# Patient Record
Sex: Female | Born: 1935 | Race: White | Hispanic: No | Smoking: Former smoker
Health system: Southern US, Community
[De-identification: ages and names within clinical notes are randomized; demographics above are authoritative.]

## PROBLEM LIST (undated history)

## (undated) DIAGNOSIS — I4821 Permanent atrial fibrillation: Secondary | ICD-10-CM

## (undated) DIAGNOSIS — I2510 Atherosclerotic heart disease of native coronary artery without angina pectoris: Secondary | ICD-10-CM

## (undated) DIAGNOSIS — E785 Hyperlipidemia, unspecified: Secondary | ICD-10-CM

## (undated) DIAGNOSIS — M1990 Unspecified osteoarthritis, unspecified site: Secondary | ICD-10-CM

## (undated) DIAGNOSIS — C8590 Non-Hodgkin lymphoma, unspecified, unspecified site: Secondary | ICD-10-CM

## (undated) DIAGNOSIS — A0472 Enterocolitis due to Clostridium difficile, not specified as recurrent: Secondary | ICD-10-CM

## (undated) DIAGNOSIS — I10 Essential (primary) hypertension: Secondary | ICD-10-CM

## (undated) DIAGNOSIS — Z950 Presence of cardiac pacemaker: Secondary | ICD-10-CM

## (undated) DIAGNOSIS — K5792 Diverticulitis of intestine, part unspecified, without perforation or abscess without bleeding: Secondary | ICD-10-CM

## (undated) DIAGNOSIS — I97190 Other postprocedural cardiac functional disturbances following cardiac surgery: Secondary | ICD-10-CM

## (undated) DIAGNOSIS — K219 Gastro-esophageal reflux disease without esophagitis: Secondary | ICD-10-CM

## (undated) HISTORY — DX: Presence of cardiac pacemaker: Z95.0

## (undated) HISTORY — PX: BREAST BIOPSY: SHX20

## (undated) HISTORY — PX: OTHER SURGICAL HISTORY: SHX169

## (undated) HISTORY — PX: CARDIOVERSION: SHX1299

## (undated) HISTORY — DX: Atherosclerotic heart disease of native coronary artery without angina pectoris: I25.10

## (undated) HISTORY — DX: Unspecified osteoarthritis, unspecified site: M19.90

## (undated) HISTORY — DX: Gastro-esophageal reflux disease without esophagitis: K21.9

## (undated) HISTORY — DX: Other postprocedural cardiac functional disturbances following cardiac surgery: I97.190

## (undated) HISTORY — PX: ABDOMINAL HYSTERECTOMY: SHX81

## (undated) HISTORY — DX: Hereditary hemochromatosis: E83.110

## (undated) HISTORY — PX: CHOLECYSTECTOMY: SHX55

## (undated) HISTORY — DX: Diverticulitis of intestine, part unspecified, without perforation or abscess without bleeding: K57.92

## (undated) HISTORY — PX: CARDIAC CATHETERIZATION: SHX172

## (undated) HISTORY — DX: Enterocolitis due to Clostridium difficile, not specified as recurrent: A04.72

## (undated) HISTORY — DX: Non-Hodgkin lymphoma, unspecified, unspecified site: C85.90

## (undated) HISTORY — DX: Hyperlipidemia, unspecified: E78.5

## (undated) HISTORY — DX: Essential (primary) hypertension: I10

## (undated) HISTORY — PX: INSERT / REPLACE / REMOVE PACEMAKER: SUR710

---

## 2004-07-31 ENCOUNTER — Ambulatory Visit: Payer: Self-pay | Admitting: Internal Medicine

## 2004-08-31 ENCOUNTER — Ambulatory Visit: Payer: Self-pay | Admitting: Internal Medicine

## 2004-09-30 ENCOUNTER — Ambulatory Visit: Payer: Self-pay | Admitting: Internal Medicine

## 2004-11-11 ENCOUNTER — Ambulatory Visit: Payer: Self-pay | Admitting: Internal Medicine

## 2004-11-25 ENCOUNTER — Ambulatory Visit: Payer: Self-pay | Admitting: Cardiology

## 2004-12-01 ENCOUNTER — Ambulatory Visit: Payer: Self-pay | Admitting: Internal Medicine

## 2004-12-13 ENCOUNTER — Ambulatory Visit: Payer: Self-pay | Admitting: Internal Medicine

## 2004-12-23 ENCOUNTER — Ambulatory Visit: Payer: Self-pay

## 2005-01-05 ENCOUNTER — Other Ambulatory Visit: Payer: Self-pay

## 2005-01-05 ENCOUNTER — Inpatient Hospital Stay: Payer: Self-pay | Admitting: Cardiology

## 2005-01-07 ENCOUNTER — Ambulatory Visit: Payer: Self-pay | Admitting: Internal Medicine

## 2005-01-14 ENCOUNTER — Ambulatory Visit: Payer: Self-pay | Admitting: Cardiology

## 2005-01-14 ENCOUNTER — Inpatient Hospital Stay (HOSPITAL_COMMUNITY): Admission: EM | Admit: 2005-01-14 | Discharge: 2005-01-18 | Payer: Self-pay | Admitting: Emergency Medicine

## 2005-01-27 ENCOUNTER — Ambulatory Visit: Payer: Self-pay | Admitting: Internal Medicine

## 2005-02-03 ENCOUNTER — Ambulatory Visit: Payer: Self-pay | Admitting: Internal Medicine

## 2005-02-17 ENCOUNTER — Ambulatory Visit: Payer: Self-pay | Admitting: Cardiology

## 2005-02-18 ENCOUNTER — Ambulatory Visit (HOSPITAL_COMMUNITY): Admission: RE | Admit: 2005-02-18 | Discharge: 2005-02-19 | Payer: Self-pay | Admitting: Cardiology

## 2005-02-18 ENCOUNTER — Ambulatory Visit: Payer: Self-pay | Admitting: Cardiology

## 2005-02-18 ENCOUNTER — Encounter: Payer: Self-pay | Admitting: Cardiology

## 2005-02-21 ENCOUNTER — Ambulatory Visit: Payer: Self-pay | Admitting: Cardiology

## 2005-02-22 ENCOUNTER — Ambulatory Visit: Payer: Self-pay | Admitting: Cardiology

## 2005-02-23 ENCOUNTER — Ambulatory Visit: Payer: Self-pay | Admitting: Cardiology

## 2005-02-28 ENCOUNTER — Ambulatory Visit: Payer: Self-pay | Admitting: Internal Medicine

## 2005-03-02 ENCOUNTER — Ambulatory Visit: Payer: Self-pay | Admitting: Cardiology

## 2005-03-07 ENCOUNTER — Ambulatory Visit: Payer: Self-pay | Admitting: Internal Medicine

## 2005-03-15 ENCOUNTER — Ambulatory Visit: Payer: Self-pay | Admitting: Cardiology

## 2005-03-31 ENCOUNTER — Ambulatory Visit: Payer: Self-pay | Admitting: Internal Medicine

## 2005-04-11 ENCOUNTER — Ambulatory Visit: Payer: Self-pay | Admitting: Internal Medicine

## 2005-04-26 ENCOUNTER — Ambulatory Visit: Payer: Self-pay | Admitting: Internal Medicine

## 2005-04-30 ENCOUNTER — Ambulatory Visit: Payer: Self-pay | Admitting: Internal Medicine

## 2005-05-09 ENCOUNTER — Ambulatory Visit: Payer: Self-pay | Admitting: Internal Medicine

## 2005-05-09 ENCOUNTER — Ambulatory Visit (HOSPITAL_COMMUNITY): Admission: RE | Admit: 2005-05-09 | Discharge: 2005-05-10 | Payer: Self-pay | Admitting: Internal Medicine

## 2005-05-20 ENCOUNTER — Ambulatory Visit: Payer: Self-pay | Admitting: Internal Medicine

## 2005-05-31 ENCOUNTER — Ambulatory Visit: Payer: Self-pay | Admitting: Internal Medicine

## 2005-06-07 ENCOUNTER — Ambulatory Visit: Payer: Self-pay | Admitting: Cardiology

## 2005-06-13 ENCOUNTER — Ambulatory Visit: Payer: Self-pay | Admitting: Internal Medicine

## 2005-06-13 ENCOUNTER — Ambulatory Visit (HOSPITAL_COMMUNITY): Admission: RE | Admit: 2005-06-13 | Discharge: 2005-06-14 | Payer: Self-pay | Admitting: Internal Medicine

## 2005-06-26 ENCOUNTER — Emergency Department: Payer: Self-pay | Admitting: Emergency Medicine

## 2005-06-27 ENCOUNTER — Ambulatory Visit: Payer: Self-pay | Admitting: Internal Medicine

## 2005-06-27 ENCOUNTER — Ambulatory Visit: Payer: Self-pay

## 2005-07-01 ENCOUNTER — Ambulatory Visit: Payer: Self-pay | Admitting: Internal Medicine

## 2005-07-07 ENCOUNTER — Ambulatory Visit: Payer: Self-pay | Admitting: Internal Medicine

## 2005-09-05 ENCOUNTER — Ambulatory Visit: Payer: Self-pay | Admitting: Internal Medicine

## 2005-09-30 ENCOUNTER — Ambulatory Visit: Payer: Self-pay | Admitting: Internal Medicine

## 2005-10-07 ENCOUNTER — Ambulatory Visit: Payer: Self-pay | Admitting: Internal Medicine

## 2005-10-31 ENCOUNTER — Ambulatory Visit: Payer: Self-pay | Admitting: Internal Medicine

## 2005-12-01 ENCOUNTER — Ambulatory Visit: Payer: Self-pay | Admitting: Internal Medicine

## 2006-01-02 ENCOUNTER — Ambulatory Visit: Payer: Self-pay | Admitting: Internal Medicine

## 2006-01-29 ENCOUNTER — Ambulatory Visit: Payer: Self-pay | Admitting: Internal Medicine

## 2006-03-03 ENCOUNTER — Ambulatory Visit: Payer: Self-pay | Admitting: Internal Medicine

## 2006-03-24 ENCOUNTER — Ambulatory Visit: Payer: Self-pay | Admitting: Internal Medicine

## 2006-03-31 ENCOUNTER — Ambulatory Visit: Payer: Self-pay | Admitting: Internal Medicine

## 2006-05-16 ENCOUNTER — Ambulatory Visit: Payer: Self-pay | Admitting: Internal Medicine

## 2006-05-31 ENCOUNTER — Ambulatory Visit: Payer: Self-pay | Admitting: Internal Medicine

## 2006-06-28 ENCOUNTER — Ambulatory Visit: Payer: Self-pay | Admitting: Unknown Physician Specialty

## 2006-06-30 ENCOUNTER — Ambulatory Visit: Payer: Self-pay | Admitting: Internal Medicine

## 2006-09-12 ENCOUNTER — Ambulatory Visit: Payer: Self-pay | Admitting: Internal Medicine

## 2006-09-13 ENCOUNTER — Ambulatory Visit: Payer: Self-pay | Admitting: Internal Medicine

## 2006-09-30 ENCOUNTER — Ambulatory Visit: Payer: Self-pay | Admitting: Internal Medicine

## 2006-10-31 ENCOUNTER — Ambulatory Visit: Payer: Self-pay | Admitting: Internal Medicine

## 2006-10-31 HISTORY — PX: ATRIAL ABLATION SURGERY: SHX560

## 2006-12-01 ENCOUNTER — Ambulatory Visit: Payer: Self-pay | Admitting: Internal Medicine

## 2006-12-17 ENCOUNTER — Ambulatory Visit: Payer: Self-pay | Admitting: Internal Medicine

## 2006-12-30 ENCOUNTER — Ambulatory Visit: Payer: Self-pay | Admitting: Internal Medicine

## 2007-01-30 ENCOUNTER — Ambulatory Visit: Payer: Self-pay | Admitting: Internal Medicine

## 2007-02-09 ENCOUNTER — Ambulatory Visit: Payer: Self-pay | Admitting: Cardiology

## 2007-02-09 ENCOUNTER — Inpatient Hospital Stay (HOSPITAL_COMMUNITY): Admission: AD | Admit: 2007-02-09 | Discharge: 2007-02-14 | Payer: Self-pay | Admitting: Internal Medicine

## 2007-02-18 ENCOUNTER — Emergency Department (HOSPITAL_COMMUNITY): Admission: EM | Admit: 2007-02-18 | Discharge: 2007-02-18 | Payer: Self-pay | Admitting: Emergency Medicine

## 2007-03-01 ENCOUNTER — Ambulatory Visit: Payer: Self-pay

## 2007-03-05 ENCOUNTER — Ambulatory Visit: Payer: Self-pay | Admitting: Internal Medicine

## 2007-03-19 ENCOUNTER — Ambulatory Visit: Payer: Self-pay | Admitting: Internal Medicine

## 2007-04-30 ENCOUNTER — Ambulatory Visit: Payer: Self-pay | Admitting: Internal Medicine

## 2007-05-01 ENCOUNTER — Ambulatory Visit: Payer: Self-pay | Admitting: Internal Medicine

## 2007-05-15 ENCOUNTER — Ambulatory Visit: Payer: Self-pay | Admitting: Internal Medicine

## 2007-06-01 ENCOUNTER — Ambulatory Visit: Payer: Self-pay | Admitting: Internal Medicine

## 2007-07-02 ENCOUNTER — Ambulatory Visit: Payer: Self-pay | Admitting: Internal Medicine

## 2007-07-30 ENCOUNTER — Ambulatory Visit: Payer: Self-pay | Admitting: Internal Medicine

## 2007-07-31 ENCOUNTER — Ambulatory Visit: Payer: Self-pay | Admitting: Internal Medicine

## 2007-09-01 ENCOUNTER — Ambulatory Visit: Payer: Self-pay | Admitting: Internal Medicine

## 2007-09-11 ENCOUNTER — Ambulatory Visit: Payer: Self-pay | Admitting: Internal Medicine

## 2007-09-24 ENCOUNTER — Ambulatory Visit: Payer: Self-pay | Admitting: Cardiology

## 2007-09-24 ENCOUNTER — Ambulatory Visit: Payer: Self-pay | Admitting: Internal Medicine

## 2007-10-01 ENCOUNTER — Ambulatory Visit: Payer: Self-pay | Admitting: Internal Medicine

## 2007-10-05 ENCOUNTER — Ambulatory Visit: Payer: Self-pay | Admitting: Internal Medicine

## 2007-10-16 ENCOUNTER — Ambulatory Visit: Payer: Self-pay | Admitting: Internal Medicine

## 2007-10-17 ENCOUNTER — Ambulatory Visit: Payer: Self-pay | Admitting: Internal Medicine

## 2007-12-06 ENCOUNTER — Ambulatory Visit (HOSPITAL_COMMUNITY): Admission: RE | Admit: 2007-12-06 | Discharge: 2007-12-06 | Payer: Self-pay | Admitting: Gastroenterology

## 2007-12-06 ENCOUNTER — Encounter: Payer: Self-pay | Admitting: Gastroenterology

## 2007-12-17 ENCOUNTER — Ambulatory Visit: Payer: Self-pay | Admitting: Gastroenterology

## 2007-12-28 ENCOUNTER — Ambulatory Visit: Payer: Self-pay | Admitting: Internal Medicine

## 2007-12-30 ENCOUNTER — Ambulatory Visit: Payer: Self-pay | Admitting: Internal Medicine

## 2008-01-15 ENCOUNTER — Ambulatory Visit: Payer: Self-pay | Admitting: Internal Medicine

## 2008-01-24 ENCOUNTER — Ambulatory Visit: Payer: Self-pay | Admitting: Unknown Physician Specialty

## 2008-01-30 ENCOUNTER — Ambulatory Visit: Payer: Self-pay | Admitting: Internal Medicine

## 2008-03-31 ENCOUNTER — Ambulatory Visit: Payer: Self-pay | Admitting: Internal Medicine

## 2008-04-30 ENCOUNTER — Ambulatory Visit: Payer: Self-pay | Admitting: Internal Medicine

## 2008-05-13 ENCOUNTER — Ambulatory Visit: Payer: Self-pay | Admitting: Internal Medicine

## 2008-05-31 ENCOUNTER — Ambulatory Visit: Payer: Self-pay | Admitting: Internal Medicine

## 2008-06-03 ENCOUNTER — Ambulatory Visit: Payer: Self-pay | Admitting: Unknown Physician Specialty

## 2008-06-24 ENCOUNTER — Ambulatory Visit: Payer: Self-pay | Admitting: Internal Medicine

## 2008-07-15 ENCOUNTER — Ambulatory Visit: Payer: Self-pay | Admitting: Internal Medicine

## 2008-07-31 ENCOUNTER — Ambulatory Visit: Payer: Self-pay | Admitting: Internal Medicine

## 2008-07-31 HISTORY — PX: US ECHOCARDIOGRAPHY: HXRAD669

## 2008-08-08 ENCOUNTER — Ambulatory Visit: Payer: Self-pay | Admitting: Cardiology

## 2008-08-08 ENCOUNTER — Encounter: Payer: Self-pay | Admitting: Internal Medicine

## 2008-08-08 LAB — CONVERTED CEMR LAB
BUN: 16 mg/dL (ref 6–23)
Basophils Absolute: 0 10*3/uL (ref 0.0–0.1)
Basophils Relative: 0 % (ref 0–1)
Chloride: 103 meq/L (ref 96–112)
Eosinophils Absolute: 0.1 10*3/uL (ref 0.0–0.7)
Eosinophils Relative: 1 % (ref 0–5)
HCT: 40.2 % (ref 36.0–46.0)
Hemoglobin: 13.7 g/dL (ref 12.0–15.0)
Lymphocytes Relative: 41 % (ref 12–46)
MCV: 103.1 fL — ABNORMAL HIGH (ref 78.0–100.0)
Monocytes Relative: 8 % (ref 3–12)
Neutrophils Relative %: 49 % (ref 43–77)
Platelets: 232 10*3/uL (ref 150–400)
Potassium: 4.5 meq/L (ref 3.5–5.3)
Sodium: 140 meq/L (ref 135–145)
WBC: 10.7 10*3/uL — ABNORMAL HIGH (ref 4.0–10.5)

## 2008-08-19 ENCOUNTER — Encounter: Payer: Self-pay | Admitting: Cardiology

## 2008-08-19 ENCOUNTER — Ambulatory Visit: Payer: Self-pay

## 2008-08-20 ENCOUNTER — Ambulatory Visit: Payer: Self-pay | Admitting: Internal Medicine

## 2008-09-08 ENCOUNTER — Ambulatory Visit: Payer: Self-pay | Admitting: Internal Medicine

## 2008-10-31 ENCOUNTER — Ambulatory Visit: Payer: Self-pay | Admitting: Internal Medicine

## 2008-11-18 ENCOUNTER — Ambulatory Visit: Payer: Self-pay | Admitting: Internal Medicine

## 2008-12-01 ENCOUNTER — Ambulatory Visit: Payer: Self-pay | Admitting: Internal Medicine

## 2008-12-04 ENCOUNTER — Ambulatory Visit: Payer: Self-pay | Admitting: Internal Medicine

## 2009-02-06 ENCOUNTER — Encounter (INDEPENDENT_AMBULATORY_CARE_PROVIDER_SITE_OTHER): Payer: Self-pay

## 2009-02-17 ENCOUNTER — Ambulatory Visit: Payer: Self-pay | Admitting: Internal Medicine

## 2009-02-28 ENCOUNTER — Ambulatory Visit: Payer: Self-pay | Admitting: Internal Medicine

## 2009-03-06 ENCOUNTER — Encounter: Payer: Self-pay | Admitting: Internal Medicine

## 2009-03-09 ENCOUNTER — Ambulatory Visit: Payer: Self-pay | Admitting: Internal Medicine

## 2009-03-13 ENCOUNTER — Encounter: Payer: Self-pay | Admitting: Internal Medicine

## 2009-03-16 ENCOUNTER — Ambulatory Visit: Payer: Self-pay | Admitting: Cardiology

## 2009-04-30 ENCOUNTER — Ambulatory Visit: Payer: Self-pay | Admitting: Internal Medicine

## 2009-05-19 ENCOUNTER — Ambulatory Visit: Payer: Self-pay | Admitting: Internal Medicine

## 2009-05-31 ENCOUNTER — Ambulatory Visit: Payer: Self-pay | Admitting: Internal Medicine

## 2009-06-10 ENCOUNTER — Ambulatory Visit: Payer: Self-pay | Admitting: Internal Medicine

## 2009-06-15 ENCOUNTER — Encounter: Payer: Self-pay | Admitting: Internal Medicine

## 2009-07-09 ENCOUNTER — Telehealth: Payer: Self-pay | Admitting: Internal Medicine

## 2009-07-09 ENCOUNTER — Ambulatory Visit: Payer: Self-pay | Admitting: Internal Medicine

## 2009-07-20 ENCOUNTER — Ambulatory Visit: Payer: Self-pay | Admitting: Internal Medicine

## 2009-07-20 DIAGNOSIS — I482 Chronic atrial fibrillation, unspecified: Secondary | ICD-10-CM

## 2009-07-20 DIAGNOSIS — I4821 Permanent atrial fibrillation: Secondary | ICD-10-CM

## 2009-07-20 HISTORY — DX: Permanent atrial fibrillation: I48.21

## 2009-07-30 ENCOUNTER — Ambulatory Visit: Payer: Self-pay | Admitting: Rheumatology

## 2009-08-04 ENCOUNTER — Telehealth: Payer: Self-pay | Admitting: Internal Medicine

## 2009-08-05 ENCOUNTER — Ambulatory Visit: Payer: Self-pay | Admitting: Cardiovascular Disease

## 2009-08-24 ENCOUNTER — Ambulatory Visit: Payer: Self-pay | Admitting: Internal Medicine

## 2009-08-31 ENCOUNTER — Ambulatory Visit: Payer: Self-pay | Admitting: Internal Medicine

## 2009-10-27 ENCOUNTER — Telehealth: Payer: Self-pay | Admitting: Internal Medicine

## 2009-10-29 ENCOUNTER — Ambulatory Visit: Payer: Self-pay | Admitting: Cardiology

## 2009-10-29 LAB — CONVERTED CEMR LAB: POC INR: 2.3

## 2009-10-31 ENCOUNTER — Ambulatory Visit: Payer: Self-pay | Admitting: Internal Medicine

## 2009-11-19 ENCOUNTER — Ambulatory Visit: Payer: Self-pay | Admitting: Internal Medicine

## 2009-11-20 ENCOUNTER — Ambulatory Visit: Payer: Self-pay | Admitting: Unknown Physician Specialty

## 2009-11-23 ENCOUNTER — Ambulatory Visit: Payer: Self-pay | Admitting: Internal Medicine

## 2009-11-25 ENCOUNTER — Ambulatory Visit: Payer: Self-pay | Admitting: Internal Medicine

## 2009-12-01 ENCOUNTER — Ambulatory Visit: Payer: Self-pay | Admitting: Internal Medicine

## 2009-12-02 ENCOUNTER — Encounter: Payer: Self-pay | Admitting: Internal Medicine

## 2009-12-07 ENCOUNTER — Ambulatory Visit: Payer: Self-pay | Admitting: Cardiovascular Disease

## 2009-12-07 ENCOUNTER — Encounter: Payer: Self-pay | Admitting: Cardiovascular Disease

## 2009-12-07 LAB — CONVERTED CEMR LAB: POC INR: 1.9

## 2009-12-09 ENCOUNTER — Ambulatory Visit: Payer: Self-pay | Admitting: Cardiovascular Disease

## 2009-12-09 DIAGNOSIS — E785 Hyperlipidemia, unspecified: Secondary | ICD-10-CM

## 2010-01-04 ENCOUNTER — Ambulatory Visit: Payer: Self-pay | Admitting: Cardiovascular Disease

## 2010-01-04 LAB — CONVERTED CEMR LAB: POC INR: 2.6

## 2010-02-01 ENCOUNTER — Ambulatory Visit: Payer: Self-pay | Admitting: Cardiovascular Disease

## 2010-02-08 ENCOUNTER — Telehealth: Payer: Self-pay | Admitting: Cardiovascular Disease

## 2010-03-01 ENCOUNTER — Ambulatory Visit: Payer: Self-pay | Admitting: Cardiovascular Disease

## 2010-03-01 LAB — CONVERTED CEMR LAB
Cholesterol: 206 mg/dL — ABNORMAL HIGH (ref 0–200)
HDL: 40 mg/dL (ref >39)
Total CHOL/HDL Ratio: 5.2

## 2010-03-18 ENCOUNTER — Ambulatory Visit: Payer: Self-pay | Admitting: Internal Medicine

## 2010-04-05 ENCOUNTER — Encounter: Payer: Self-pay | Admitting: Internal Medicine

## 2010-04-06 ENCOUNTER — Ambulatory Visit: Payer: Self-pay | Admitting: Unknown Physician Specialty

## 2010-04-08 ENCOUNTER — Ambulatory Visit: Payer: Self-pay | Admitting: Cardiovascular Disease

## 2010-04-08 LAB — CONVERTED CEMR LAB: POC INR: 2.2

## 2010-04-15 ENCOUNTER — Encounter: Payer: Self-pay | Admitting: Cardiovascular Disease

## 2010-04-30 ENCOUNTER — Ambulatory Visit: Payer: Self-pay | Admitting: Internal Medicine

## 2010-05-07 ENCOUNTER — Ambulatory Visit: Payer: Self-pay | Admitting: Cardiovascular Disease

## 2010-05-07 LAB — CONVERTED CEMR LAB: POC INR: 3.3

## 2010-05-12 ENCOUNTER — Ambulatory Visit: Payer: Self-pay | Admitting: Cardiovascular Disease

## 2010-05-12 LAB — CONVERTED CEMR LAB: POC INR: 2.5

## 2010-05-31 ENCOUNTER — Ambulatory Visit: Payer: Self-pay | Admitting: Internal Medicine

## 2010-06-02 ENCOUNTER — Ambulatory Visit: Payer: Self-pay | Admitting: Cardiovascular Disease

## 2010-06-02 LAB — CONVERTED CEMR LAB: POC INR: 1.9

## 2010-06-15 ENCOUNTER — Ambulatory Visit: Payer: Self-pay | Admitting: Internal Medicine

## 2010-06-21 ENCOUNTER — Encounter: Payer: Self-pay | Admitting: Internal Medicine

## 2010-06-23 ENCOUNTER — Ambulatory Visit: Payer: Self-pay | Admitting: Cardiovascular Disease

## 2010-06-23 LAB — CONVERTED CEMR LAB: POC INR: 2

## 2010-06-25 ENCOUNTER — Ambulatory Visit: Payer: Self-pay | Admitting: Unknown Physician Specialty

## 2010-07-21 ENCOUNTER — Ambulatory Visit: Payer: Self-pay | Admitting: Cardiovascular Disease

## 2010-07-21 LAB — CONVERTED CEMR LAB: POC INR: 2.7

## 2010-07-27 ENCOUNTER — Ambulatory Visit: Payer: Self-pay | Admitting: Surgery

## 2010-07-28 LAB — PATHOLOGY REPORT

## 2010-08-04 ENCOUNTER — Ambulatory Visit: Payer: Self-pay | Admitting: Cardiovascular Disease

## 2010-08-04 LAB — CONVERTED CEMR LAB: POC INR: 1.6

## 2010-08-18 ENCOUNTER — Ambulatory Visit: Payer: Self-pay | Admitting: Internal Medicine

## 2010-08-23 ENCOUNTER — Encounter: Payer: Self-pay | Admitting: Cardiovascular Disease

## 2010-08-26 ENCOUNTER — Ambulatory Visit: Payer: Self-pay | Admitting: Cardiovascular Disease

## 2010-08-31 ENCOUNTER — Ambulatory Visit: Payer: Self-pay | Admitting: Internal Medicine

## 2010-09-08 ENCOUNTER — Ambulatory Visit: Payer: Self-pay | Admitting: Cardiology

## 2010-09-16 ENCOUNTER — Ambulatory Visit: Payer: Self-pay | Admitting: Internal Medicine

## 2010-09-29 ENCOUNTER — Ambulatory Visit: Payer: Self-pay | Admitting: Cardiovascular Disease

## 2010-10-01 ENCOUNTER — Encounter (INDEPENDENT_AMBULATORY_CARE_PROVIDER_SITE_OTHER): Payer: Self-pay | Admitting: *Deleted

## 2010-10-27 ENCOUNTER — Ambulatory Visit: Payer: Self-pay | Admitting: Cardiovascular Disease

## 2010-10-27 LAB — CONVERTED CEMR LAB: POC INR: 1.7

## 2010-11-17 ENCOUNTER — Ambulatory Visit: Payer: Self-pay | Admitting: Internal Medicine

## 2010-11-17 ENCOUNTER — Ambulatory Visit: Admission: RE | Admit: 2010-11-17 | Discharge: 2010-11-17 | Payer: Self-pay | Source: Home / Self Care

## 2010-11-28 LAB — CONVERTED CEMR LAB: POC INR: 1.8

## 2010-11-30 NOTE — Progress Notes (Signed)
Summary: RX  Medications Added CYCLOBENZAPRINE HCL 5 MG TABS (CYCLOBENZAPRINE HCL) take one tablet by mouth three times a day as needed CYCLOBENZAPRINE HCL 5 MG TABS (CYCLOBENZAPRINE HCL) take one tablet by mouth three times a day as needed WARFARIN SODIUM 2 MG TABS (WARFARIN SODIUM) Use as directed by [facility]tico[facility] Clinic AMLODIPINE BESYLATE 5 MG TABS (AMLODIPINE BESYLATE) Take one tablet by mouth daily GABAPENTIN 100 MG CAPS (GABAPENTIN) 1 by mouth two times a day FOLIC ACID 1 MG TABS (FOLIC ACID) 1 by mouth once daily VITAMIN D 1000 UNIT TABS (CHOLECALCIFEROL) 1 by mouth once daily AMITRIPTYLINE HCL 10 MG TABS (AMITRIPTYLINE HCL) 1 by mouth at bedtime as needed NEXIUM 40 MG CPDR (ESOMEPRAZOLE MAGNESIUM) 1 by mouth two times a day ALLOPURINOL 300 MG TABS (ALLOPURINOL) 1 by mouth once daily METOPROLOL SUCCINATE 50 MG XR24H-TAB (METOPROLOL SUCCINATE) Take one tablet by mouth daily METOPROLOL SUCCINATE 50 MG XR24H-TAB (METOPROLOL SUCCINATE) Take 1/2 tablet by mouth daily FENOFIBRATE 160 MG TABS (FENOFIBRATE) 1 tab by mouth daily       Phone Note Refill Request Call back at Home Phone [phone] Message from:  SELF on February 08, 2010 11:19 AM  Refills Requested: Medication #1:  WARFARIN SODIUM 2 MG TABS Use as directed by [facility]tion Clinic TAKES 3 MG AND 2 MG-[facility] [address][phone]  Initial call taken by: Harlon Flor,  February 08, 2010 11:19 AM    Prescriptions: WARFARIN SODIUM 2 MG TABS (WARFARIN SODIUM) Use as directed by [facility]  #30 x 3   Entered by:   Stanton Kidney, EMT-P   Authorized by:   Dossie Arbour MD   Signed by:   Stanton Kidney, EMT-P on 02/08/2010   Method used:   Electronically to        [facility] [phone]* (retail)       [address] [place], [place]  [address]       Ph: [phone]       Fax: [phone]   RxID:   [id number]   Appended Document: RX    Clinical Lists Changes  Medications: Added new  medication of WARFARIN SODIUM 3 MG TABS (WARFARIN SODIUM) Use as directed by [facility] - Signed Rx of WARFARIN SODIUM 2 MG TABS (WARFARIN SODIUM) Use as directed by [facility];  #30 x 3;  Signed;  Entered by: Mercer Pod;  Authorized by: Dossie Arbour MD;  Method used: Electronically to [facility] [id number]*, [address], [place], [place]  [address], Ph: [phone], Fax: [phone] Rx of WARFARIN SODIUM 3 MG TABS (WARFARIN SODIUM) Use as directed by Antico[facility]ualtion Clinic;  #30 x 3;  Signed;  Entered by: Mercer Pod;  Authorized by: Dossie Arbour MD;  Method used: Electronically to [facility] [address], [place], [place], [place]  [address], Ph: [phone], Fax: [phone] Rx of WARFARIN SODIUM 2 MG TABS (WARFARIN SODIUM) Use as directed by [facility] Clinic;  #30 x 3;  Signed;  Entered by: Mercer Pod;  Authorized by: Dossie Arbour MD;  Method used: Electronically to [facility] [address], [place], [place], [place]  [address], Ph: [phone], Fax: [phone]    Prescriptions: WARFARIN SODIUM 2 MG TABS (WARFARIN SODIUM) Use as directed by [facility]tion Clinic  #30 x 3   Entered by:   Mercer Pod   Authorized by:   Dossie Arbour MD   Signed by:   Mercer Pod on 02/09/2010   Method  used:   Electronically to        [facility] (retail)       [address]       [place], [place]  [address]       Ph: [phone]       Fax: [phone]   RxID:   [id number] WARFARIN SODIUM 3 MG TABS (WARFARIN SODIUM) Use as directed by [facility]tion Clinic  #30 x 3   Entered by:   Mercer Pod   Authorized by:   Dossie Arbour MD   Signed by:   Mercer Pod on 02/09/2010   Method used:   Electronically to        [facility] (retail)       [address]       [place], [place]  [address]       Ph:  [phone]       Fax: [phone]   RxID:   [id number] WARFARIN SODIUM 2 MG TABS (WARFARIN SODIUM) Use as directed by [facility]tion Clinic  #30 x 3   Entered by:   Mercer Pod   Authorized by:   Dossie Arbour MD   Signed by:   Mercer Pod on 02/09/2010   Method used:   Electronically to        [facility] [phone]* (retail)       [address] [place], [place]  [address]       Ph: [phone]       Fax: [phone]   RxID:   [id number]

## 2010-11-30 NOTE — Cardiovascular Report (Signed)
Summary: Office Visit Remote   Office Visit Remote   Imported By: Roderic Ovens 10/01/2010 15:12:06  _____________________________________________________________________  External Attachment:    Type:   Image     Comment:   External Document

## 2010-11-30 NOTE — Medication Information (Signed)
Summary: CCR/AMD   Anticoagulant Therapy  Managed by: Charlena Cross, RN, BSN Referring MD: Graciela Husbands PCP: Dale [place], MD Supervising MD: Graciela Husbands MD, Viviann Spare INR POC 2.6 INR RANGE 2.0-3.0  Dietary changes: no    Health status changes: yes       Details: bronchitis0- ABT and prednisone  Bleeding/hemorrhagic complications: no    Recent/future hospitalizations: no    Any changes in medication regimen? yes       Details: last day of prednisone  Recent/future dental: no  Any missed doses?: no       Is patient compliant with meds? yes       Allergies: No Known Drug Allergies  Anticoagulation Management History:      The patient is taking warfarin and comes in today for a routine follow up visit.  Positive risk factors for bleeding include an age of 75 years or older.  The bleeding index is 'intermediate risk'.  Positive CHADS2 values include Age > 75 years old.  Anticoagulation responsible provider: Graciela Husbands MD, Viviann Spare.  INR POC: 2.6.    Anticoagulation Management Assessment/Plan:      The patient's current anticoagulation dose is Warfarin sodium 2 mg tabs: Use as directed by Anticoagualtion Clinic.  The target INR is 2.0-3.0.  The next INR is due 02/01/2010.  Results were reviewed/authorized by Charlena Cross, RN, BSN.  She was notified by Charlena Cross, RN, BSN.         Prior Anticoagulation Instructions: coumadin 5 mg today then resume coumadin 3 mg on Monday and Thurs, 2 mg all other days.  Current Anticoagulation Instructions: The patient is to continue with the same dose of coumadin.  This dosage includes: 2 mg daily with 3 mg on Monday and Thurs

## 2010-11-30 NOTE — Letter (Signed)
Summary: Remote Device Check  [facility], Main Office  [address]   [place], [place] [address]   Phone: [phone]  Fax: [phone]     June 21, 2010 MRN: [id number]   [facility] [address] [place], [place]  [address]   Dear Ms. Gust,   Your remote transmission was recieved and reviewed by your physician.  All diagnostics were within normal limits for you.  __X___Your next transmission is scheduled for:   09-16-2010.  Please transmit at any time this day.  If you have a wireless device your transmission will be sent automatically.   Sincerely,  Vella Kohler

## 2010-11-30 NOTE — Cardiovascular Report (Signed)
Summary: Office Visit Remote   Office Visit Remote   Imported By: Roderic Ovens 06/22/2010 15:53:06  _____________________________________________________________________  External Attachment:    Type:   Image     Comment:   External Document

## 2010-11-30 NOTE — Assessment & Plan Note (Signed)
Summary: EKG/AMD  Nurse Visit   Allergies: No Known Drug Allergies  Orders Added: 1)  EKG w/ Interpretation [93000]

## 2010-11-30 NOTE — Letter (Signed)
Summary: Remote Device Check  [facility], Main Office  [address]   [place], [place] [address]   Phone: [phone]  Fax: [phone]     December 02, 2009 MRN: [id number]   [facility] [address] [place], [place]  [address]   Dear Ms. Rack,   Your remote transmission was recieved and reviewed by your physician.  All diagnostics were within normal limits for you.    __X____Your next office visit is scheduled for:  MAY 2011 WITH DR Graciela Husbands in our [place] office. Please call our office at [phone] to schedule an appointment.    Sincerely,  Proofreader

## 2010-11-30 NOTE — Medication Information (Signed)
Summary: Coumadin Clinic   Anticoagulant Therapy  Managed by: Charlena Cross, RN, BSN Referring MD: Graciela Husbands PCP: Dale [place], MD Supervising MD: Clifton James MD, Christopher INR POC 1.9 INR RANGE 2.0-3.0  Dietary changes: no    Health status changes: yes       Details: sinus infection and cold  Bleeding/hemorrhagic complications: no    Recent/future hospitalizations: no    Any changes in medication regimen? no    Recent/future dental: no  Any missed doses?: no       Is patient compliant with meds? yes       Allergies: No Known Drug Allergies  Anticoagulation Management History:      The patient is taking warfarin and comes in today for a routine follow up visit.  Positive risk factors for bleeding include an age of 75 years or older.  The bleeding index is 'intermediate risk'.  Positive CHADS2 values include Age > 11 years old.  Anticoagulation responsible provider: Clifton James MD, Cristal Deer.  INR POC: 1.9.    Anticoagulation Management Assessment/Plan:      The patient's current anticoagulation dose is Warfarin sodium 2 mg tabs: Use as directed by Anticoagualtion Clinic.  The target INR is 2.0-3.0.  The next INR is due 01/04/2010.  Results were reviewed/authorized by Charlena Cross, RN, BSN.  She was notified by Charlena Cross, RN, BSN.         Prior Anticoagulation Instructions: coumadin 3 mg on Mon and Thurs. Coumadin 2 mg all other days.  Current Anticoagulation Instructions: coumadin 5 mg today then resume coumadin 3 mg on Monday and Thurs, 2 mg all other days.

## 2010-11-30 NOTE — Medication Information (Signed)
Summary: CCR/AMD   Anticoagulant Therapy  Managed by: Charlena Cross, RN, BSN Referring MD: Graciela Husbands PCP: Dale [place], MD Supervising MD: Mariah Milling INR POC 2.3 INR RANGE 2.0-3.0  Dietary changes: no    Health status changes: no    Bleeding/hemorrhagic complications: no    Recent/future hospitalizations: no    Any changes in medication regimen? no    Recent/future dental: no  Any missed doses?: no       Is patient compliant with meds? yes       Allergies: No Known Drug Allergies  Anticoagulation Management History:      The patient is taking warfarin and comes in today for a routine follow up visit.  Positive risk factors for bleeding include an age of 75 years or older.  The bleeding index is 'intermediate risk'.  Positive CHADS2 values include Age > 75 years old.  Anticoagulation responsible provider: Arrow Emmerich.  INR POC: 2.3.    Anticoagulation Management Assessment/Plan:      The patient's current anticoagulation dose is Warfarin sodium 2 mg tabs: Use as directed by Anticoagualtion Clinic.  The target INR is 2.0-3.0.  The next INR is due 03/01/2010.  Results were reviewed/authorized by Charlena Cross, RN, BSN.  She was notified by Charlena Cross, RN, BSN.         Prior Anticoagulation Instructions: The patient is to continue with the same dose of coumadin.  This dosage includes: 2 mg daily with 3 mg on Monday and Thurs  Current Anticoagulation Instructions: coumadin 2 mg daily with 3 mg on Monday and Thurs

## 2010-11-30 NOTE — Medication Information (Signed)
Summary: CCR/NE  Anticoagulant Therapy  Managed by: Cloyde Reams, RN, BSN Referring MD: Graciela Husbands PCP: Dale [place], MD Supervising MD: Mariah Milling Indication 1: Atrial Fibrillation Indication 2: Pacemaker 101 Lab Used: [facility] [place] Site: [place] INR POC 1.9 INR RANGE 2.0-3.0  Dietary changes: no    Health status changes: no    Bleeding/hemorrhagic complications: no    Recent/future hospitalizations: no    Any changes in medication regimen? no    Recent/future dental: no  Any missed doses?: no       Is patient compliant with meds? yes       Allergies: 1)  ! * Levaquin  Anticoagulation Management History:      The patient is taking warfarin and comes in today for a routine follow up visit.  Positive risk factors for bleeding include an age of 33 years or older.  The bleeding index is 'intermediate risk'.  Positive CHADS2 values include Age > 22 years old.  Anticoagulation responsible [name]: [name].  INR POC: 1.9.  Cuvette Lot#: [id number].  Exp: 04/2011.    Anticoagulation Management Assessment/Plan:      The patient's current anticoagulation dose is Warfarin sodium 2 mg tabs: Use as directed by Anticoagualtion Clinic, Warfarin sodium 3 mg tabs: Use as directed by Anticoagualtion Clinic.  The target INR is 2.0-3.0.  The next INR is due 06/23/2010.  Results were reviewed/authorized by Cloyde Reams, RN, BSN.  She was notified by Cloyde Reams RN.         Prior Anticoagulation Instructions: INR 2.5  Continue on same dosage 2mg  daily except 3mg  on Saturdays.  Recheck 3 weeks.    Current Anticoagulation Instructions: INR 1.9  Take 3mg  today, then resume same dosage 2mg  daily except 3mg  on Thursdays.  Recheck in 3-4 weeks.

## 2010-11-30 NOTE — Medication Information (Signed)
Summary: CCR/AMD   Anticoagulant Therapy  Managed by: Cloyde Reams, RN, BSN Referring MD: Graciela Husbands PCP: Dale [place], MD Supervising MD: Mariah Milling Indication 1: Atrial Fibrillation Indication 2: Pacemaker 101 Lab Used: [facility] [place] Site: [place] INR POC 3.3 INR RANGE 2.0-3.0  Dietary changes: no     Bleeding/hemorrhagic complications: no     Any changes in medication regimen? yes       Details: Pt started taking fenofitrate160 about 1 month ago.    Any missed doses?: no       Is patient compliant with meds? yes       Allergies: 1)  ! * Levaquin  Anticoagulation Management History:      The patient is taking warfarin and comes in today for a routine follow up visit.  Positive risk factors for bleeding include an age of 92 years or older.  The bleeding index is 'intermediate risk'.  Positive CHADS2 values include Age > 87 years old.  Anticoagulation responsible provider: Salimata Christenson.  INR POC: 3.3.  Cuvette Lot#: [id number].  Exp: 07/2011.    Anticoagulation Management Assessment/Plan:      The patient's current anticoagulation dose is Warfarin sodium 2 mg tabs: Use as directed by Anticoagualtion Clinic, Warfarin sodium 3 mg tabs: Use as directed by Anticoagualtion Clinic.  The target INR is 2.0-3.0.  The next INR is due 05/12/2010.  Results were reviewed/authorized by Cloyde Reams, RN, BSN.  She was notified by Benedict Needy, RN.         Prior Anticoagulation Instructions: INR 2.2  Continue on same dosage 2mg  daily except 3mg  on Mondays, Thursdays, and Saturdays.  Recheck in 4 weeks.   Current Anticoagulation Instructions: INR 3.3  Please eat some green leafy veggies. Then Start taking 2mg  daily except 3 mg on Saturday. Recheck in 10 days-2 weeks.

## 2010-11-30 NOTE — Cardiovascular Report (Signed)
Summary: Office Visit Remote   Office Visit Remote   Imported By: Roderic Ovens 12/07/2009 16:29:21  _____________________________________________________________________  External Attachment:    Type:   Image     Comment:   External Document

## 2010-11-30 NOTE — Medication Information (Signed)
Summary: CCR/NEE  Anticoagulant Therapy  Managed by: Cloyde Reams, RN, BSN Referring MD: Graciela Husbands PCP: Dale [place], MD Supervising MD: Mariah Milling Indication 1: Atrial Fibrillation Indication 2: Pacemaker 101 Lab Used: [facility] [place] Site: [place] INR POC 2.5 INR RANGE 2.0-3.0  Dietary changes: no    Health status changes: no    Bleeding/hemorrhagic complications: no    Recent/future hospitalizations: no    Any changes in medication regimen? no    Recent/future dental: no  Any missed doses?: no       Is patient compliant with meds? yes       Allergies: 1)  ! * Levaquin  Anticoagulation Management History:      The patient is taking warfarin and comes in today for a routine follow up visit.  Positive risk factors for bleeding include an age of 75 years or older.  The bleeding index is 'intermediate risk'.  Positive CHADS2 values include Age > 10 years old.  Anticoagulation responsible provider: Keilany Burnette.  INR POC: 2.5.  Cuvette Lot#: [id number].  Exp: 07/2011.    Anticoagulation Management Assessment/Plan:      The patient's current anticoagulation dose is Warfarin sodium 2 mg tabs: Use as directed by Anticoagualtion Clinic, Warfarin sodium 3 mg tabs: Use as directed by Anticoagualtion Clinic.  The target INR is 2.0-3.0.  The next INR is due 06/02/2010.  Results were reviewed/authorized by Cloyde Reams, RN, BSN.  She was notified by Cloyde Reams RN.         Prior Anticoagulation Instructions: INR 3.3  Please eat some green leafy veggies. Then Start taking 2mg  daily except 3 mg on Saturday. Recheck in 10 days-2 weeks.   Current Anticoagulation Instructions: INR 2.5  Continue on same dosage 2mg  daily except 3mg  on Saturdays.  Recheck 3 weeks.

## 2010-11-30 NOTE — Letter (Signed)
Summary: Medical Record Release  Medical Record Release   Imported By: Harlon Flor 07/21/2010 15:07:11  _____________________________________________________________________  External Attachment:    Type:   Image     Comment:   External Document

## 2010-11-30 NOTE — Medication Information (Signed)
Summary: CCR/AMD  Anticoagulant Therapy  Managed by: Cloyde Reams, RN, BSN Referring MD: Graciela Husbands PCP: Dale , MD Supervising MD: Mariah Milling Indication 1: Atrial Fibrillation Indication 2: Pacemaker 101 [place] Site: [place] INR POC 1.8 INR RANGE 2.0-3.0  Dietary changes: no    Health status changes: no    Bleeding/hemorrhagic complications: no    Recent/future hospitalizations: no    Any changes in medication regimen? no    Recent/future dental: no  Any missed doses?: no       Is patient compliant with meds? yes       Allergies (verified): No Known Drug Allergies  Anticoagulation Management History:      The patient is taking warfarin and comes in today for a routine follow up visit.  Positive risk factors for bleeding include an age of 75 years or older.  The bleeding index is 'intermediate risk'.  Positive CHADS2 values include Age > 42 years old.  Anticoagulation responsible provider: Chaslyn Eisen.  INR POC: 1.8.  Cuvette Lot#: [id number].  Exp: 03/2011.    Anticoagulation Management Assessment/Plan:      The patient's current anticoagulation dose is Warfarin sodium 2 mg tabs: Use as directed by Anticoagualtion Clinic, Warfarin sodium 3 mg tabs: Use as directed by Anticoagualtion Clinic.  The target INR is 2.0-3.0.  The next INR is due 03/18/2010.  Results were reviewed/authorized by Cloyde Reams, RN, BSN.  She was notified by Cloyde Reams RN.         Prior Anticoagulation Instructions: coumadin 2 mg daily with 3 mg on Monday and Thurs  Current Anticoagulation Instructions: INR 1.8  Take 4mg  today, then resume same dosage 2mg  daily except 3mg  on Mondays and Thursdays.  Recheck in 3 weeks.

## 2010-11-30 NOTE — Medication Information (Signed)
Summary: [name]  Anticoagulant Therapy  Managed by: Cloyde Reams, RN, BSN Referring MD: Graciela Husbands PCP: Dale [place], MD Supervising MD: Mariah Milling Indication 1: Atrial Fibrillation Indication 2: Pacemaker 101 Lab Used: [facility] [place] Site: [place] INR POC 1.6 INR RANGE 2.0-3.0  Dietary changes: no    Health status changes: no    Bleeding/hemorrhagic complications: no    Recent/future hospitalizations: no    Any changes in medication regimen? no    Recent/future dental: no  Any missed doses?: no       Is patient compliant with meds? yes      Comments: S/P gallbladder surgery, completed Lovenox bridge on Sat 07/31/10.  Allergies: 1)  ! * Levaquin  Anticoagulation Management History:      The patient is taking warfarin and comes in today for a routine follow up visit.  Positive risk factors for bleeding include an age of 75 years or older.  The bleeding index is 'intermediate risk'.  Positive CHADS2 values include Age > 53 years old.  Anticoagulation responsible provider: gollan.  INR POC: 1.6.  Cuvette Lot#: [id number].  Exp: 09/2011.    Anticoagulation Management Assessment/Plan:      The patient's current anticoagulation dose is Warfarin sodium 2 mg tabs: Use as directed by Anticoagualtion Clinic, Warfarin sodium 3 mg tabs: Use as directed by Anticoagualtion Clinic.  The target INR is 2.0-3.0.  The next INR is due 08/18/2010.  Results were reviewed/authorized by Cloyde Reams, RN, BSN.  She was notified by Cloyde Reams RN.         Prior Anticoagulation Instructions: INR 2.7  Stop Coumadin today as instructed.  Resume Coumadin daily on 07/28/10 at previous dosage 2mg  daily except 3mg  on Mondays and Thursdays.  Recheck 1 week after surgery.  Current Anticoagulation Instructions: INR 1.6  Take 3mg  today and tomorrow, then resume same dosage 2mg  daily except 3mg  on Mondays and Thursdays.  Recheck in 2 weeks.

## 2010-11-30 NOTE — Medication Information (Signed)
Summary: rov/nb  Anticoagulant Therapy  Managed by: Bethena Midget, RN, BSN Referring MD: Graciela Husbands PCP: Dale [place], MD Supervising MD: Mariah Milling  Indication 1: Atrial Fibrillation Indication 2: Pacemaker 101 Lab Used: [facility] [place] Site: [place] INR POC 2.4 INR RANGE 2.0-3.0  Dietary changes: no    Health status changes: no    Bleeding/hemorrhagic complications: no    Recent/future hospitalizations: no    Any changes in medication regimen? no    Recent/future dental: no  Any missed doses?: no       Is patient compliant with meds? yes       Allergies: 1)  ! * Levaquin  Anticoagulation Management History:      The patient is taking warfarin and comes in today for a routine follow up visit.  Positive risk factors for bleeding include an age of 52 years or older.  The bleeding index is 'intermediate risk'.  Positive CHADS2 values include Age > 47 years old.  Anticoagulation responsible provider: Gollan .  INR POC: 2.4.  Cuvette Lot#: [id number].  Exp: 10/2011.    Anticoagulation Management Assessment/Plan:      The patient's current anticoagulation dose is Warfarin sodium 2 mg tabs: Use as directed by Anticoagualtion Clinic, Warfarin sodium 3 mg tabs: Use as directed by Anticoagualtion Clinic.  The target INR is 2.0-3.0.  The next INR is due 10/27/2010.  Anticoagulation instructions were given to patient.  Results were reviewed/authorized by Bethena Midget, RN, BSN.  She was notified by Bethena Midget, RN, BSN.         Prior Anticoagulation Instructions: INR 1.7 Take 3 mg today, then 2 mg everyday except 3 mg on Monday, Thursday, and Saturday. Recheck INR in 3 weeks   Current Anticoagulation Instructions: INR 2.4 Continue 2mg s everyday except 3mg s on Mondays, Thursdays and Saturdays. Recheck in 4 weeks.

## 2010-11-30 NOTE — Medication Information (Signed)
Summary: rov/ewj  Anticoagulant Therapy  Managed by: Cloyde Reams, RN, BSN Referring MD: Graciela Husbands PCP: Dale [place], MD Supervising MD: Mariah Milling Indication 1: Atrial Fibrillation Indication 2: Pacemaker 101 Lab Used: [facility] [place] Site: [place] INR POC 2.7 INR RANGE 2.0-3.0  Dietary changes: no    Health status changes: no    Bleeding/hemorrhagic complications: no    Recent/future hospitalizations: no    Any changes in medication regimen? no    Recent/future dental: no  Any missed doses?: no       Is patient compliant with meds? yes      Comments: Pt is having her gallbladder removed on 07/27/10.  Dr Renda Rolls is surgeon and has already arranged Lovenox bridge for pt while off Coumadin.  Last dose of Coumadin today, Lovenox 30mg  two times a day 9/24-9/26 am.  Resume Lovenox 07/28/10 30mg  two times a day and Coumadin.  Allergies: 1)  ! * Levaquin  Anticoagulation Management History:      The patient is taking warfarin and comes in today for a routine follow up visit.  Positive risk factors for bleeding include an age of 75 years or older.  The bleeding index is 'intermediate risk'.  Positive CHADS2 values include Age > 31 years old.  Anticoagulation responsible provider: gollan.  INR POC: 2.7.  Exp: 08/2011.    Anticoagulation Management Assessment/Plan:      The patient's current anticoagulation dose is Warfarin sodium 2 mg tabs: Use as directed by Anticoagualtion Clinic, Warfarin sodium 3 mg tabs: Use as directed by Anticoagualtion Clinic.  The target INR is 2.0-3.0.  The next INR is due 08/04/2010.  Results were reviewed/authorized by Cloyde Reams, RN, BSN.  She was notified by Cloyde Reams RN.         Prior Anticoagulation Instructions: INR 2.0  Start taking 2mg  daily except 3mg  on Mondays and Thursdays.  Recheck in 4 weeks.    Current Anticoagulation Instructions: INR 2.7  Stop Coumadin today as instructed.  Resume Coumadin daily on  07/28/10 at previous dosage 2mg  daily except 3mg  on Mondays and Thursdays.  Recheck 1 week after surgery.

## 2010-11-30 NOTE — Letter (Signed)
Summary: Remote Device Check  [facility], Main Office  [address]   [place], [place] [address]   Phone: [phone]  Fax: [phone]     October 01, 2010 MRN: [id number]   [facility] [address] [place], [place]  [address]   Dear Ms. Geathers,   Your remote transmission was recieved and reviewed by your physician.  All diagnostics were within normal limits for you.  _X____Your next transmission is scheduled for:  12/16/2010.   Please transmit at any time this day.  If you have a wireless device your transmission will be sent automatically.  ______Your next office visit is scheduled for:                              . Please call our office to schedule an appointment.    Sincerely,  Altha Harm, LPN

## 2010-11-30 NOTE — Medication Information (Signed)
Summary: rov/ewj  Anticoagulant Therapy  Managed by: Cloyde Reams, RN, BSN Referring MD: Graciela Husbands PCP: Dale [place], MD Supervising MD: Graciela Husbands MD, Viviann Spare Indication 1: Atrial Fibrillation Indication 2: Pacemaker 101 [place] Site: [place] INR POC 1.8 INR RANGE 2.0-3.0    Bleeding/hemorrhagic complications: no     Any changes in medication regimen? yes       Details: OTC allergy med.   Any missed doses?: yes     Details: Missed 1 dose on Mother's day took an extra 1/2 tablet the next day.    Allergies: No Known Drug Allergies  Anticoagulation Management History:      The patient is taking warfarin and comes in today for a routine follow up visit.  Positive risk factors for bleeding include an age of 75 years or older.  The bleeding index is 'intermediate risk'.  Positive CHADS2 values include Age > 35 years old.  Anticoagulation responsible provider: Graciela Husbands MD, Viviann Spare.  INR POC: 1.8.  Cuvette Lot#: [id number].  Exp: 06/2011.    Anticoagulation Management Assessment/Plan:      The patient's current anticoagulation dose is Warfarin sodium 2 mg tabs: Use as directed by Anticoagualtion Clinic, Warfarin sodium 3 mg tabs: Use as directed by Anticoagualtion Clinic.  The target INR is 2.0-3.0.  The next INR is due 04/08/2010.  Results were reviewed/authorized by Cloyde Reams, RN, BSN.  She was notified by Cloyde Reams RN.         Prior Anticoagulation Instructions: INR 1.8  Take 4mg  today, then resume same dosage 2mg  daily except 3mg  on Mondays and Thursdays.  Recheck in 3 weeks.    Current Anticoagulation Instructions: INR 1.8  Take 4mg  today, then start taking 2mg  daily except 3mg  on Mondays, Thursdays, and Saturdays.  Recheck in 3 weeks.

## 2010-11-30 NOTE — Medication Information (Signed)
Summary: rov/ewj  Anticoagulant Therapy  Managed by: Cloyde Reams, RN, BSN Referring MD: Graciela Husbands PCP: Dale [place], MD Supervising MD: Gala Romney MD, Reuel Boom Indication 1: Atrial Fibrillation Indication 2: Pacemaker 101 Lab Used: [facility] [place] Site: [place] INR RANGE 2.0-3.0  Dietary changes: no    Health status changes: no    Bleeding/hemorrhagic complications: no    Recent/future hospitalizations: no    Any changes in medication regimen? no    Recent/future dental: no  Any missed doses?: no       Is patient compliant with meds? yes       Allergies: 1)  ! * Levaquin  Anticoagulation Management History:      The patient is taking warfarin and comes in today for a routine follow up visit.  Positive risk factors for bleeding include an age of 58 years or older.  The bleeding index is 'intermediate risk'.  Positive CHADS2 values include Age > 60 years old.  Anticoagulation responsible provider: Jhonny Calixto MD, Reuel Boom.  Cuvette Lot#: [id number].  Exp: 09/2011.    Anticoagulation Management Assessment/Plan:      The patient's current anticoagulation dose is Warfarin sodium 2 mg tabs: Use as directed by Anticoagualtion Clinic, Warfarin sodium 3 mg tabs: Use as directed by Anticoagualtion Clinic.  The target INR is 2.0-3.0.  The next INR is due 09/08/2010.  Results were reviewed/authorized by Cloyde Reams, RN, BSN.  She was notified by Cloyde Reams RN.         Prior Anticoagulation Instructions: INR 1.6  Take 3mg  today and tomorrow, then resume same dosage 2mg  daily except 3mg  on Mondays and Thursdays.  Recheck in 2 weeks.    Current Anticoagulation Instructions: INR 1.9  Take 3mg  today, then resume same dosage 2mg  daily except 3mg  on Mondays and Thursdays.  Recheck in 3 weeks.

## 2010-11-30 NOTE — Medication Information (Signed)
Summary: Coumadin Clinic  Anticoagulant Therapy  Managed by: Cloyde Reams, RN, BSN Referring MD: Graciela Husbands PCP: Dale [place], MD Supervising MD: Mariah Milling Indication 1: Atrial Fibrillation Indication 2: Pacemaker 101 Lab Used: [facility] [place] Site: [place] INR POC 2.0 INR RANGE 2.0-3.0  Dietary changes: yes       Details: Eating more tomatoes, incr acid reflux.   Health status changes: no    Bleeding/hemorrhagic complications: no    Recent/future hospitalizations: no    Any changes in medication regimen? yes       Details: started on sucralfate 1gm and Nexium 40mg   qd.   Recent/future dental: no  Any missed doses?: no       Is patient compliant with meds? yes       Allergies: 1)  ! * Levaquin  Anticoagulation Management History:      The patient is taking warfarin and comes in today for a routine follow up visit.  Positive risk factors for bleeding include an age of 75 years or older.  The bleeding index is 'intermediate risk'.  Positive CHADS2 values include Age > 75 years old.  Anticoagulation responsible provider: gollan.  INR POC: 2.0.  Cuvette Lot#: [id number].  Exp: 08/2011.    Anticoagulation Management Assessment/Plan:      The patient's current anticoagulation dose is Warfarin sodium 2 mg tabs: Use as directed by Anticoagualtion Clinic, Warfarin sodium 3 mg tabs: Use as directed by Anticoagualtion Clinic.  The target INR is 2.0-3.0.  The next INR is due 07/21/2010.  Results were reviewed/authorized by Cloyde Reams, RN, BSN.  She was notified by Cloyde Reams RN.         Prior Anticoagulation Instructions: INR 1.9  Take 3mg  today, then resume same dosage 2mg  daily except 3mg  on Thursdays.  Recheck in 3-4 weeks.    Current Anticoagulation Instructions: INR 2.0  Start taking 2mg  daily except 3mg  on Mondays and Thursdays.  Recheck in 4 weeks.

## 2010-11-30 NOTE — Medication Information (Signed)
Summary: CCR/AMD   Anticoagulant Therapy  Managed by: Charlena Cross, RN, BSN Referring MD: Graciela Husbands PCP: Dale [place], MD Supervising MD: Gala Romney MD, Tammi Boulier INR POC 2.1  Dietary changes: no    Health status changes: yes       Details: cold  Bleeding/hemorrhagic complications: no    Recent/future hospitalizations: no    Any changes in medication regimen? yes       Details: cefzil oral and injection of PCN  Recent/future dental: no  Any missed doses?: no       Is patient compliant with meds? yes       Allergies: No Known Drug Allergies  Anticoagulation Management History:      The patient is taking warfarin and comes in today for a routine follow up visit.  Positive risk factors for bleeding include an age of 75 years or older.  The bleeding index is 'intermediate risk'.  Positive CHADS2 values include Age > 2 years old.  Anticoagulation responsible provider: Payden Docter MD, Reuel Boom.  INR POC: 2.1.    Anticoagulation Management Assessment/Plan:      The patient's current anticoagulation dose is Warfarin sodium 2 mg tabs: Use as directed by Anticoagualtion Clinic.  The target INR is 2.0-3.0.  The next INR is due 12/17/2009.  Results were reviewed/authorized by Charlena Cross, RN, BSN.  She was notified by Charlena Cross, RN, BSN.         Prior Anticoagulation Instructions: The patient is to continue with the same dose of coumadin.  This dosage includes: coumadin 3 mg on Mon and Thurs.  Coumadin 2 mg all other days.  Current Anticoagulation Instructions: coumadin 3 mg on Mon and Thurs. Coumadin 2 mg all other days.

## 2010-11-30 NOTE — Assessment & Plan Note (Signed)
Summary: ROV/AMD  Medications Added FENOFIBRATE 160 MG TABS (FENOFIBRATE) 1 tab by mouth daily      Allergies Added: NKDA  Visit Type:  ROV Primary Provider:  Dale [place], MD  CC:  No complaints.  History of Present Illness: [name] is a very pleasant 77 year old woman with a history of sick sinus syndrome, itch or fibrillation, status post AV nodal ablation with pacemaker implantation, who presents for recent symptoms of palpitations in her stomach and neck.  [name] states that over the past week or 2 she has had very uncomfortable symptoms of palpitations in her stomach, sub-xiphoid region as well as her right neck. She notices them more at nighttime and Less in the daytime. She states that they have now resolved and she feels well. She denies any significant shortness of breath. No chest pain.  She has not had these sensations before but wanted to make sure that her pacemaker was doing well.  Current Problems (verified): 1)  Pacemaker,mdt Ddd  (ICD-V45.01) 2)  Av Block, S/p Av Ablation  (ICD-426.0) 3)  Atrial Fibrillation  (ICD-427.31) 4)  Chest Tightness-pressure-other  ([id number]) 5)  Palpitations  (ICD-785.1)  Current Medications (verified): 1)  Warfarin Sodium 2 Mg Tabs (Warfarin Sodium) .... Use As Directed By Anticoagualtion Clinic 2)  Amlodipine Besylate 5 Mg Tabs (Amlodipine Besylate) .... Take One Tablet By Mouth Daily 3)  Gabapentin 100 Mg Caps (Gabapentin) .[name].. 1 By Mouth Two Times A Day 4)  Folic Acid 1 Mg Tabs (Folic Acid) .[name].. 1 By Mouth Once Daily 5)  Vitamin D 1000 Unit Tabs (Cholecalciferol) .[name].. 1 By Mouth Once Daily 6)  Amitriptyline Hcl 10 Mg Tabs (Amitriptyline Hcl) .[name].. 1 By Mouth At Bedtime As Needed 7)  Nexium 40 Mg Cpdr (Esomeprazole Magnesium) .[name].. 1 By Mouth Two Times A Day 8)  Allopurinol 300 Mg Tabs (Allopurinol) .[name].. 1 By Mouth Once Daily 9)  Metoprolol Succinate 50 Mg Xr24h-Tab (Metoprolol Succinate) .... Take 1/2 Tablet By Mouth  Daily  Allergies (verified): No Known Drug Allergies  Past History:  Past Medical History: Last updated: 2009-07-11 PAST MEDICAL HISTORY: 1. Sick sinus syndrome with atrial fibrillation.  The patient is     status post AV nodal ablation due to permanent and symptomatic     atrial fibrillation.  This AV nodal ablation was in 2008. 2. Non-Hodgkin lymphoma which is stable.  She is followed by an     oncologist. 3. History of nonobstructive coronary artery disease on heart     catheterization done over 5 years ago. 4. The patient has a Medtronic dual-chamber pacemaker. 5. Hypertension. 6. Hyperlipidemia. 7. History of hereditary hemochromatosis.  The patient was     phlebotomized in the past for this.  She is not getting phlebotomy     now. 8. Gastroesophageal reflux disease. 9. Most recent echo in October 2009.  EF was 55%.  There was mild LVH.     There were no significant valvular abnormalities.  Past Surgical History: Last updated: 07/11/09 Hysterectomy Ablation Pacemaker   Family History: Last updated: 07/11/2009 Father: deceased 89 liver disease Mother: deceased 87 cancer  Social History: Last updated: 07-11-2009 Retired  Tobacco Use - No.  Alcohol Use - no  Risk Factors: Smoking Status: never (11-Jul-2009)  Review of Systems  The patient denies anorexia, fever, weight loss, weight gain, vision loss, decreased hearing, hoarseness, chest pain, syncope, dyspnea on exertion, peripheral edema, prolonged cough, headaches, hemoptysis, abdominal pain, melena, hematochezia, severe indigestion/heartburn, hematuria, incontinence, genital sores,  muscle weakness, suspicious skin lesions, transient blindness, difficulty walking, depression, unusual weight change, abnormal bleeding, enlarged lymph nodes, angioedema, breast masses, and testicular masses.         palpitations  Vital Signs:  Patient profile:   77 year old female Height:      62.5 inches Weight:      140.25  pounds BMI:     25.33 Pulse rate:   72 / minute Pulse rhythm:   regular BP sitting:   130 / 68  (right arm) Cuff size:   regular  Vitals Entered By: Mercer Pod (December 09, 2009 10:42 AM)   PPM Specifications Following MD:  Sherryl Manges, MD     PPM Vendor:  Medtronic     PPM Model Number:  [id number]     PPM Serial Number:  [id number] PPM DOI:  01/18/2005     PPM Implanting MD:  Sherryl Manges, MD  Lead 1    Location: [facility]     DOI: 05/18/1999     Model #: [id number]     Serial #: [id number]     Status: active Lead 2    Location: [facility]     DOI: 05/18/1999     Model #: [id number]     Serial #: [id number]     Status: active   Indications:  SND   PPM Follow Up Pacer Dependent:  Yes      Episodes Coumadin:  Yes  Parameters Mode:  DDIR     Lower Rate Limit:  60     Upper Rate Limit:  120 Paced AV Delay:  180     Rate Response Parameters:  slope of 7, threshold medium low  Impression & Recommendations:  Problem # 1:  PALPITATIONS (ICD-785.1) etiology of [name]'s palpitations is uncertain. She is currently asymptomatic. I'm uncertain if this may been secondary to blood pressure, some other arrhythmia were due to rapid heart rate. Her heart rate is reasonably well controlled on today's visit. We have not made any medication changes as she is asymptomatic. She states that she used to take a half a tablet of metoprolol on an as needed basis and more recently has been taking it on a daily basis. Perhaps having a slower rate, she has been pacing more and having more symptoms. If she has more symptoms, we will set her up to have her device interrogated by Dr. Graciela Husbands.  Her updated medication list for this problem includes:    Warfarin Sodium 2 Mg Tabs (Warfarin sodium) ..... Use as directed by anticoagualtion clinic    Amlodipine Besylate 5 Mg Tabs (Amlodipine besylate) .[name]... Take one tablet by mouth daily    Metoprolol Succinate 50 Mg Xr24h-tab (Metoprolol succinate) .[name]... Take 1/2 tablet by mouth  daily  Problem # 2:  ATRIAL FIBRILLATION (ICD-427.31) atrial fibrillation with rate control on metoprolol 25 mg daily. No other changes made at this time. Her updated medication list for this problem includes:    Warfarin Sodium 2 Mg Tabs (Warfarin sodium) ..... Use as directed by anticoagualtion clinic    Metoprolol Succinate 50 Mg Xr24h-tab (Metoprolol succinate) .[name]... Take 1/2 tablet by mouth daily  Problem # 3:  CHEST TIGHTNESS-PRESSURE-OTHER ([id number]) no chest tightness or pressure noted at this time. She has no known stenoses though notes indicate nonobstructive coronary disease by catheterization done over 5 years ago. No stress test at this time. We will discuss her cholesterol with her on her next visit.  Problem # 4:  HYPERLIPIDEMIA-MIXED (ICD-272.4)  we did discuss the very elevated triglycerides with [name]. She would like [place] to start a medication and we have prescribed fenofibrate once a day with a repeat check of her cholesterol in 3 months time. Her updated medication list for this problem includes:    Fenofibrate 160 Mg Tabs (Fenofibrate) .[name]... 1 tab by mouth daily Prescriptions: FENOFIBRATE 160 MG TABS (FENOFIBRATE) 1 tab by mouth daily  #30 x 6   Entered by:   Charlena Cross, RN, BSN   Authorized by:   Dossie Arbour MD   Signed by:   Charlena Cross, RN, BSN on 12/09/2009   Method used:   Electronically to        [facility] [phone]* (retail)       [address] [place], [place]  [address]       Ph: [phone]       Fax: [phone]   RxID:   [id number]

## 2010-11-30 NOTE — Procedures (Signed)
Summary: f39m   Visit Type:  Follow-up Primary Provider:  Dale Hopewell, MD  CC:  NO CP, NO SOB, and NO EDEMA IN ANKLES AND FEET.  History of Present Illness: [name] is a very pleasant 77 year old woman with a history of sick sinus syndrome, atrial fibrillation, status post AV nodal ablation with pacemaker implantation,   She is doing pretty well. She has had some problems with palpitations. These have abated.  She also has had problems with dyslipidemia with elevated triglycerides most recently the levels over 290. She didn't prescribed fenofibrate but had not started taking that.    Current Problems (verified): 1)  Hyperlipidemia-mixed  (ICD-272.4) 2)  Pacemaker,mdt Ddd  (ICD-V45.01) 3)  Av Block, S/p Av Ablation  (ICD-426.0) 4)  Atrial Fibrillation  (ICD-427.31) 5)  Chest Tightness-pressure-other  ([id number]) 6)  Palpitations  (ICD-785.1)  Current Medications (verified): 1)  Warfarin Sodium 2 Mg Tabs (Warfarin Sodium) .... Use As Directed By Anticoagualtion Clinic 2)  Amlodipine Besylate 5 Mg Tabs (Amlodipine Besylate) .... Take One Tablet By Mouth Daily 3)  Gabapentin 100 Mg Caps (Gabapentin) .[name].. 1 By Mouth Three Times A Day 4)  Folic Acid 1 Mg Tabs (Folic Acid) .[name].. 1 By Mouth Once Daily 5)  Vitamin D 1000 Unit Tabs (Cholecalciferol) .[name].. 1 By Mouth Once Daily 6)  Amitriptyline Hcl 10 Mg Tabs (Amitriptyline Hcl) .[name].. 1 By Mouth At Bedtime As Needed 7)  Nexium 40 Mg Cpdr (Esomeprazole Magnesium) .[name].. 1 By Mouth Two Times A Day 8)  Allopurinol 300 Mg Tabs (Allopurinol) .[name].. 1 By Mouth Once Daily 9)  Metoprolol Succinate 50 Mg Xr24h-Tab (Metoprolol Succinate) .... Only Takes As Needed 10)  Fenofibrate 160 Mg Tabs (Fenofibrate) .[name].. 1 Tab By Mouth Daily 11)  Warfarin Sodium 3 Mg Tabs (Warfarin Sodium) .... Use As Directed By Anticoagualtion Clinic  Allergies (verified): 1)  ! * Levaquin  Past History:  Past Medical History: Last updated: 07/26/09 PAST MEDICAL  HISTORY: 1. Sick sinus syndrome with atrial fibrillation.  The patient is     status post AV nodal ablation due to permanent and symptomatic     atrial fibrillation.  This AV nodal ablation was in 2008. 2. Non-Hodgkin lymphoma which is stable.  She is followed by an     oncologist. 3. History of nonobstructive coronary artery disease on heart     catheterization done over 5 years ago. 4. The patient has a Medtronic dual-chamber pacemaker. 5. Hypertension. 6. Hyperlipidemia. 7. History of hereditary hemochromatosis.  The patient was     phlebotomized in the past for this.  She is not getting phlebotomy     now. 8. Gastroesophageal reflux disease. 9. Most recent echo in October 2009.  EF was 55%.  There was mild LVH.     There were no significant valvular abnormalities.  Past Surgical History: Last updated: 07-26-09 Hysterectomy Ablation Pacemaker   Family History: Last updated: 07/26/09 Father: deceased 92 liver disease Mother: deceased 59 cancer  Social History: Last updated: 2009/07/26 Retired  Tobacco Use - No.  Alcohol Use - no  Risk Factors: Smoking Status: never (2009-07-26)  Vital Signs:  Patient profile:   77 year old female Height:      62.5 inches Pulse rate:   62 / minute Pulse rhythm:   regular BP sitting:   133 / 75  (left arm) Cuff size:   regular  Vitals Entered By: Mercer Pod (Mar 18, 2010 10:05 AM)  Physical Exam  General:  The patient was  alert and oriented in no acute distress. HEENT Normal.  Neck veins were flat, carotids were brisk.  Lungs were clear.  Heart sounds were regular without murmurs or gallops.  Abdomen was soft with active bowel sounds. There is no clubbing cyanosis or edema. Skin Warm and dry    PPM Specifications Following MD:  Sherryl Manges, MD     PPM Vendor:  Medtronic     PPM Model Number:  [id number]     PPM Serial Number:  [id number] PPM DOI:  01/18/2005     PPM Implanting MD:  Sherryl Manges, MD  Lead 1     Location: RA     DOI: 05/18/1999     Model #: 1488TC     Serial #: [id number]     Status: active Lead 2    Location: RV     DOI: 05/18/1999     Model #: [id number]     Serial #: [id number]     Status: active  Magnet Response Rate:  BOL 85 ERI 65  Indications:  SND   PPM Follow Up Remote Check?  No Battery Voltage:  2.97 V     Pacer Dependent:  Yes       PPM Device Measurements Atrium  Amplitude: 1.6 mV, Impedance: 400 ohms,  Right Ventricle  Impedance: 600 ohms, Threshold: 0.5 V at 0.4 msec  Episodes Percent Mode Switch:  92.8%     Coumadin:  Yes  Parameters Mode:  DDIR     Lower Rate Limit:  60     Upper Rate Limit:  120 Paced AV Delay:  180     Rate Response Parameters:  slope of 7, threshold medium low Next Remote Date:  06/17/2010     Next Cardiology Appt Due:  03/01/2011 Tech Comments:  No parameter changes.  A-flutter today, + coumadin.    [facility] transmissions every 3 months.  ROV 1 year with Dr. Graciela Husbands in [place]. Altha Harm, LPN  Mar 18, 2010 10:14 AM   Impression & Recommendations:  Problem # 1:  PACEMAKER,MDT DDD (ICD-V45.01) Device parameters and data were reviewed and no changes were made  we note that there is less than 100% V. pacing suggesting that there are PVCs. I suspect that these are responsible for her palpitations  Problem # 2:  PALPITATIONS (ICD-785.1)  psee the above Her updated medication list for this problem includes:    Warfarin Sodium 2 Mg Tabs (Warfarin sodium) ..... Use as directed by anticoagualtion clinic    Amlodipine Besylate 5 Mg Tabs (Amlodipine besylate) .[name]... Take one tablet by mouth daily    Metoprolol Succinate 50 Mg Xr24h-tab (Metoprolol succinate) ..... Only takes as needed    Warfarin Sodium 3 Mg Tabs (Warfarin sodium) ..... Use as directed by anticoagualtion clinic  Her updated medication list for this problem includes:    Warfarin Sodium 2 Mg Tabs (Warfarin sodium) ..... Use as directed by anticoagualtion clinic    Amlodipine  Besylate 5 Mg Tabs (Amlodipine besylate) .[name]... Take one tablet by mouth daily    Metoprolol Succinate 50 Mg Xr24h-tab (Metoprolol succinate) ..... Only takes as needed    Warfarin Sodium 3 Mg Tabs (Warfarin sodium) ..... Use as directed by anticoagualtion clinic  Problem # 3:  HYPERLIPIDEMIA-MIXED (ICD-272.4)  Iencouraged her to take her fenofibrate. Will plan to see her again in 4 weeks time for recheck of her lipids and liver function testing Her updated medication list for this problem includes:    Fenofibrate 160 Mg Tabs (  Fenofibrate) .[name]... 1 tab by mouth daily  Her updated medication list for this problem includes:    Fenofibrate 160 Mg Tabs (Fenofibrate) .[name]... 1 tab by mouth daily  Problem # 4:  ATRIAL FIBRILLATION (ICD-427.31) atrial fibrillation is permanent and she is on warfarin  Problem # 5:  AV BLOCK, S/P AV ABLATION (ICD-426.0)  heart block is stable with a pacemaker in place  Her updated medication list for this problem includes:    Warfarin Sodium 2 Mg Tabs (Warfarin sodium) ..... Use as directed by anticoagualtion clinic    Amlodipine Besylate 5 Mg Tabs (Amlodipine besylate) .[name]... Take one tablet by mouth daily    Metoprolol Succinate 50 Mg Xr24h-tab (Metoprolol succinate) ..... Only takes as needed    Warfarin Sodium 3 Mg Tabs (Warfarin sodium) ..... Use as directed by anticoagualtion clinic  Patient Instructions: 1)  Your physician recommends that you return for a FASTING lipid profile: in 4 weeks.  (lipid/liver)

## 2010-11-30 NOTE — Medication Information (Signed)
Summary: CCR/AMD  Anticoagulant Therapy  Managed by: Cloyde Reams, RN, BSN Referring MD: Graciela Husbands PCP: Dale Marianna, MD Supervising MD: Mariah Milling Indication 1: Atrial Fibrillation Indication 2: Pacemaker 101 Lab Used: [facility] [place] Site: [place] INR POC 2.2 INR RANGE 2.0-3.0    Bleeding/hemorrhagic complications: no     Any changes in medication regimen? no     Any missed doses?: no       Is patient compliant with meds? yes       Allergies: 1)  ! * Levaquin  Anticoagulation Management History:      The patient is taking warfarin and comes in today for a routine follow up visit.  Positive risk factors for bleeding include an age of 75 years or older.  The bleeding index is 'intermediate risk'.  Positive CHADS2 values include Age > 75 years old.  Anticoagulation responsible provider: gollan.  INR POC: 2.2.  Cuvette Lot#: [id number].  Exp: 06/2011.    Anticoagulation Management Assessment/Plan:      The patient's current anticoagulation dose is Warfarin sodium 2 mg tabs: Use as directed by Anticoagualtion Clinic, Warfarin sodium 3 mg tabs: Use as directed by Anticoagualtion Clinic.  The target INR is 2.0-3.0.  The next INR is due 05/06/2010.  Results were reviewed/authorized by Cloyde Reams, RN, BSN.  She was notified by Cloyde Reams RN.         Prior Anticoagulation Instructions: INR 1.8  Take 4mg  today, then start taking 2mg  daily except 3mg  on Mondays, Thursdays, and Saturdays.  Recheck in 3 weeks.    Current Anticoagulation Instructions: INR 2.2  Continue on same dosage 2mg  daily except 3mg  on Mondays, Thursdays, and Saturdays.  Recheck in 4 weeks.

## 2010-11-30 NOTE — Assessment & Plan Note (Signed)
Summary: ROV/AMD  Medications Added NEXIUM 40 MG CPDR (ESOMEPRAZOLE MAGNESIUM) one tablet once daily METOPROLOL SUCCINATE 50 MG XR24H-TAB (METOPROLOL SUCCINATE) Take 1/2-1  tablet by mouth once a day      Allergies Added:   Visit Type:  Follow-up Primary Provider:  Dale Martin, MD  CC:  c/o when sitting down at times can feel her heart pounding.[name]  History of Present Illness: [name] is a very pleasant 77 year old woman with a history of sick sinus syndrome, atrial fibrillation, status post AV nodal ablation with pacemaker implantation,   She has had increasing number of episodes of fluttering/palpitations. they happen two times per week, last sometimes more than one hour. She has not been taking anything for these epsiodes. Otherwise she feels well and has no other complaints. Pacer interogation shows that she is in atrial fib 20% of the time.  She also has had problems with dyslipidemia with elevated triglycerides most recently the levels over 290.   EKG shows paved rhythm, atrial flutter, ventricular rate of 65 bpm.    Current Medications (verified): 1)  Warfarin Sodium 2 Mg Tabs (Warfarin Sodium) .... Use As Directed By Anticoagualtion Clinic 2)  Amlodipine Besylate 5 Mg Tabs (Amlodipine Besylate) .... Take One Tablet By Mouth Daily 3)  Gabapentin 100 Mg Caps (Gabapentin) .[name].. 1 By Mouth Three Times A Day 4)  Folic Acid 1 Mg Tabs (Folic Acid) .[name].. 1 By Mouth Once Daily 5)  Nexium 40 Mg Cpdr (Esomeprazole Magnesium) .... One Tablet Once Daily 6)  Allopurinol 300 Mg Tabs (Allopurinol) .[name].. 1 By Mouth Once Daily 7)  Metoprolol Succinate 50 Mg Xr24h-Tab (Metoprolol Succinate) .... Only Takes As Needed 8)  Warfarin Sodium 3 Mg Tabs (Warfarin Sodium) .... Use As Directed By Anticoagualtion Clinic  Allergies (verified): 1)  ! * Levaquin  Past History:  Past Medical History: Last updated: 08/05/2009 PAST MEDICAL HISTORY: 1. Sick sinus syndrome with atrial fibrillation.   The patient is     status post AV nodal ablation due to permanent and symptomatic     atrial fibrillation.  This AV nodal ablation was in 2008. 2. Non-Hodgkin lymphoma which is stable.  She is followed by an     oncologist. 3. History of nonobstructive coronary artery disease on heart     catheterization done over 5 years ago. 4. The patient has a Medtronic dual-chamber pacemaker. 5. Hypertension. 6. Hyperlipidemia. 7. History of hereditary hemochromatosis.  The patient was     phlebotomized in the past for this.  She is not getting phlebotomy     now. 8. Gastroesophageal reflux disease. 9. Most recent echo in October 2009.  EF was 55%.  There was mild LVH.     There were no significant valvular abnormalities.  Family History: Last updated: 08-05-2009 Father: deceased 45 liver disease Mother: deceased 62 cancer  Social History: Last updated: 08/05/09 Retired  Tobacco Use - No.  Alcohol Use - no  Risk Factors: Smoking Status: never (08-05-2009)  Past Surgical History: Hysterectomy Ablation Pacemaker  gallbladder  Review of Systems  The patient denies fever, weight loss, weight gain, vision loss, decreased hearing, hoarseness, chest pain, syncope, dyspnea on exertion, peripheral edema, prolonged cough, abdominal pain, incontinence, muscle weakness, depression, and enlarged lymph nodes.         palpitations  Vital Signs:  Patient profile:   77 year old female Height:      62.5 inches Weight:      137 pounds BMI:     24.75  Pulse rate:   65 / minute BP sitting:   123 / 75  (left arm) Cuff size:   regular  Vitals Entered By: Bishop Dublin, CMA (August 26, 2010 11:32 AM)  Physical Exam  General:  Well developed, well nourished, in no acute distress. Head:  normocephalic and atraumatic Neck:  Neck supple, no JVD. No masses, thyromegaly or abnormal cervical nodes. Lungs:  Clear bilaterally to auscultation and percussion. Heart:  Non-displaced PMI, chest  non-tender; regular rate and rhythm, S1, S2 without murmurs, rubs or gallops. Carotid upstroke normal, no bruit. Normal abdominal aortic size, no bruits. Femorals normal pulses, no bruits. Pedals normal pulses. No edema, no varicosities. Abdomen:  Bowel sounds positive; abdomen soft and non-tender without masses Msk:  Back normal, normal gait. Muscle strength and tone normal. Pulses:  pulses normal in all 4 extremities Extremities:  No clubbing or cyanosis. Neurologic:  Alert and oriented x 3. Skin:  Intact without lesions or rashes. Psych:  Normal affect.   PPM Specifications Following MD:  Sherryl Manges, MD     PPM Vendor:  Medtronic     PPM Model Number:  [id number]     PPM Serial Number:  [id number] PPM DOI:  01/18/2005     PPM Implanting MD:  Sherryl Manges, MD  Lead 1    Location: RA     DOI: 05/18/1999     Model #: 1488TC     Serial #: [id number]     Status: active Lead 2    Location: RV     DOI: 05/18/1999     Model #: [id number]     Serial #: [id number]     Status: active  Magnet Response Rate:  BOL 85 ERI 65  Indications:  SND   PPM Follow Up Pacer Dependent:  Yes      Episodes Coumadin:  Yes  Parameters Mode:  DDIR     Lower Rate Limit:  60     Upper Rate Limit:  120 Paced AV Delay:  180     Rate Response Parameters:  slope of 7, threshold medium low  Impression & Recommendations:  Problem # 1:  PALPITATIONS (ICD-785.1) Etiology of her palpitations is uncertain though could be consistent with epsiodes of atrial fib/flutter. One option would be to try to control her atrial rhythm though this has been unsuccessful in the past.  her ventricular rate is independant of her fib/flutter secondary to her ablation. I have suggested that she discuss this with Dr. Graciela Husbands on her next visit. She could try metoprolol as needed for these palps to see if this makes any difference.  Her updated medication list for this problem includes:    Warfarin Sodium 2 Mg Tabs (Warfarin sodium) ..... Use as  directed by anticoagualtion clinic    Amlodipine Besylate 5 Mg Tabs (Amlodipine besylate) .[name]... Take one tablet by mouth daily    Metoprolol Succinate 50 Mg Xr24h-tab (Metoprolol succinate) .[name]... Take 1/2-1  tablet by mouth once a day    Warfarin Sodium 3 Mg Tabs (Warfarin sodium) ..... Use as directed by anticoagualtion clinic  Problem # 2:  HYPERLIPIDEMIA-MIXED (ICD-272.4) Will discuss her numbers with her on her next visit. Consider starting a statin.  The following medications were removed from the medication list:    Fenofibrate 160 Mg Tabs (Fenofibrate) .[name]... 1 tab by mouth daily  Problem # 3:  ATRIAL FIBRILLATION (ICD-427.31) s/p AV node ablation. a-fib 20% of the time per the pacer notes on warfarin  Her updated  medication list for this problem includes:    Warfarin Sodium 2 Mg Tabs (Warfarin sodium) ..... Use as directed by anticoagualtion clinic    Metoprolol Succinate 50 Mg Xr24h-tab (Metoprolol succinate) .[name]... Take 1/2-1  tablet by mouth once a day    Warfarin Sodium 3 Mg Tabs (Warfarin sodium) ..... Use as directed by anticoagualtion clinic  Patient Instructions: 1)  Your physician recommends that you continue on your current medications as directed. Please refer to the Current Medication list given to you today. Prescriptions: METOPROLOL SUCCINATE 50 MG XR24H-TAB (METOPROLOL SUCCINATE) Take 1/2-1  tablet by mouth once a day  #30 x 6   Entered by:   Benedict Needy, RN   Authorized by:   Dossie Arbour MD   Signed by:   Benedict Needy, RN on 08/26/2010   Method used:   Electronically to        [facility] (retail)       [address]       [place], [place]  [address]       Ph: [phone]       Fax: [phone]   RxID:   [id number]

## 2010-11-30 NOTE — Medication Information (Signed)
Summary: rov/ewj   Anticoagulant Therapy  Managed by: Weston Brass, PharmD Referring MD: Graciela Husbands PCP: Dale [place], MD Supervising MD: Shirlee Latch MD, [place] Indication 1: Atrial Fibrillation Indication 2: Pacemaker 101 Lab Used: [facility] [place] Site: [place] INR POC 1.7 INR RANGE 2.0-3.0  Dietary changes: no    Health status changes: no    Bleeding/hemorrhagic complications: no    Recent/future hospitalizations: no    Any changes in medication regimen? no    Recent/future dental: no  Any missed doses?: no       Is patient compliant with meds? yes       Allergies: 1)  ! * Levaquin  Anticoagulation Management History:      The patient is taking warfarin and comes in today for a routine follow up visit.  Positive risk factors for bleeding include an age of 75 years or older.  The bleeding index is 'intermediate risk'.  Positive CHADS2 values include Age > 84 years old.  Anticoagulation responsible Tyishia Aune: Shirlee Latch MD, [place].  INR POC: 1.7.  Cuvette Lot#: [id number].  Exp: 08/2011.    Anticoagulation Management Assessment/Plan:      The patient's current anticoagulation dose is Warfarin sodium 2 mg tabs: Use as directed by Anticoagualtion Clinic, Warfarin sodium 3 mg tabs: Use as directed by Anticoagualtion Clinic.  The target INR is 2.0-3.0.  The next INR is due 09/29/2010.  Results were reviewed/authorized by Weston Brass, PharmD.  She was notified by Hoy Register, PharmD Candidat.         Prior Anticoagulation Instructions: INR 1.9  Take 3mg  today, then resume same dosage 2mg  daily except 3mg  on Mondays and Thursdays.  Recheck in 3 weeks.    Current Anticoagulation Instructions: INR 1.7 Take 3 mg today, then 2 mg everyday except 3 mg on Monday, Thursday, and Saturday. Recheck INR in 3 weeks

## 2010-12-01 ENCOUNTER — Ambulatory Visit: Payer: Self-pay | Admitting: Internal Medicine

## 2010-12-02 NOTE — Medication Information (Signed)
Summary: rov/ewj  Anticoagulant Therapy  Managed by: Bethena Midget, RN, BSN Referring MD: Graciela Husbands PCP: Dale Stickney, MD Supervising MD: Mariah Milling  Indication 1: Atrial Fibrillation Indication 2: Pacemaker 101 Lab Used: [facility] [place] Site:  INR POC 2.0 INR RANGE 2.0-3.0  Dietary changes: no    Health status changes: no    Bleeding/hemorrhagic complications: no    Recent/future hospitalizations: no    Any changes in medication regimen? no    Recent/future dental: no  Any missed doses?: no       Is patient compliant with meds? yes       Allergies: 1)  ! * Levaquin  Anticoagulation Management History:      The patient is taking warfarin and comes in today for a routine follow up visit.  Positive risk factors for bleeding include an age of 75 years or older.  The bleeding index is 'intermediate risk'.  Positive CHADS2 values include Age > 75 years old.  Anticoagulation responsible provider: [facility] .  INR POC: 2.0.  Cuvette Lot#: [id number].  Exp: 12/2011.    Anticoagulation Management Assessment/Plan:      The patient's current anticoagulation dose is Warfarin sodium 2 mg tabs: Use as directed by Anticoagualtion Clinic, Warfarin sodium 3 mg tabs: Use as directed by Anticoagualtion Clinic.  The target INR is 2.0-3.0.  The next INR is due 12/15/2010.  Anticoagulation instructions were given to patient.  Results were reviewed/authorized by Bethena Midget, RN, BSN.  She was notified by Bethena Midget, RN, BSN.         Prior Anticoagulation Instructions: INR 1.7  Take 3mg  today, then resume same dosage 2mg  daily except 3mg  on Mondays, Thursdays, and Saturdays.  Recheck in 3 weeks.   Current Anticoagulation Instructions: INR 2.0 Today take 3mg s then resume 2mg s everyday except 3mg s on Mondays, Thursdays and Saturdays.  Recheck in 4 weeks.

## 2010-12-02 NOTE — Medication Information (Signed)
Summary: rov/tm  Anticoagulant Therapy  Managed by: Cloyde Reams, RN, BSN Referring MD: Graciela Husbands PCP: Dale Holliday, MD Supervising MD: Mariah Milling  Indication 1: Atrial Fibrillation Indication 2: Pacemaker 101 Lab Used: [facility] [place] Site: [place] INR POC 1.7 INR RANGE 2.0-3.0  Dietary changes: no    Health status changes: no    Bleeding/hemorrhagic complications: no    Recent/future hospitalizations: no    Any changes in medication regimen? no    Recent/future dental: no  Any missed doses?: yes     Details: Skipped 1 dose Christmas Eve's dosage secondary to incr bruising.   Is patient compliant with meds? yes       Allergies: 1)  ! * Levaquin  Anticoagulation Management History:      Positive risk factors for bleeding include an age of 5 years or older.  The bleeding index is 'intermediate risk'.  Positive CHADS2 values include Age > 54 years old.  Anticoagulation responsible provider: [facility] .  INR POC: 1.7.  Exp: 10/2011.    Anticoagulation Management Assessment/Plan:      The patient's current anticoagulation dose is Warfarin sodium 2 mg tabs: Use as directed by Anticoagualtion Clinic, Warfarin sodium 3 mg tabs: Use as directed by Anticoagualtion Clinic.  The target INR is 2.0-3.0.  The next INR is due 11/17/2010.  Anticoagulation instructions were given to patient.  Results were reviewed/authorized by Cloyde Reams, RN, BSN.  She was notified by Cloyde Reams RN.         Prior Anticoagulation Instructions: INR 2.4 Continue 2mg s everyday except 3mg s on Mondays, Thursdays and Saturdays. Recheck in 4 weeks.   Current Anticoagulation Instructions: INR 1.7  Take 3mg  today, then resume same dosage 2mg  daily except 3mg  on Mondays, Thursdays, and Saturdays.  Recheck in 3 weeks.

## 2010-12-15 ENCOUNTER — Encounter: Payer: Self-pay | Admitting: Internal Medicine

## 2010-12-15 ENCOUNTER — Encounter: Payer: Self-pay | Admitting: Cardiovascular Disease

## 2010-12-15 ENCOUNTER — Encounter (INDEPENDENT_AMBULATORY_CARE_PROVIDER_SITE_OTHER): Payer: Medicare Other

## 2010-12-15 DIAGNOSIS — Z7901 Long term (current) use of anticoagulants: Secondary | ICD-10-CM

## 2010-12-15 DIAGNOSIS — I4891 Unspecified atrial fibrillation: Secondary | ICD-10-CM

## 2010-12-15 LAB — CONVERTED CEMR LAB: POC INR: 2.3

## 2010-12-16 ENCOUNTER — Encounter (INDEPENDENT_AMBULATORY_CARE_PROVIDER_SITE_OTHER): Payer: Medicare Other

## 2010-12-16 DIAGNOSIS — I495 Sick sinus syndrome: Secondary | ICD-10-CM

## 2010-12-20 ENCOUNTER — Encounter (INDEPENDENT_AMBULATORY_CARE_PROVIDER_SITE_OTHER): Payer: Self-pay | Admitting: *Deleted

## 2010-12-22 NOTE — Medication Information (Signed)
Summary: ROV/TM/AMD  Anticoagulant Therapy  Managed by: Bethena Midget, RN, BSN Referring MD: Graciela Husbands PCP: Dale Laurens, MD Supervising MD: Mariah Milling  Indication 1: Atrial Fibrillation Indication 2: Pacemaker 101 Lab Used: [facility]  Site: [place] INR POC 2.3 INR RANGE 2.0-3.0  Dietary changes: no    Health status changes: no    Bleeding/hemorrhagic complications: no    Recent/future hospitalizations: no    Any changes in medication regimen? yes       Details: Will take Amoxicillin for 7 days for tooth infection  Recent/future dental: no  Any missed doses?: no       Is patient compliant with meds? yes       Allergies: 1)  ! * Levaquin  Anticoagulation Management History:      The patient is taking warfarin and comes in today for a routine follow up visit.  Positive risk factors for bleeding include an age of 75 years or older.  The bleeding index is 'intermediate risk'.  Positive CHADS2 values include Age > 75 years old.  Anticoagulation responsible provider: Mkayla Steele .  INR POC: 2.3.  Cuvette Lot#: [id number].  Exp: 11/2011.    Anticoagulation Management Assessment/Plan:      The patient's current anticoagulation dose is Warfarin sodium 2 mg tabs: Use as directed by Anticoagualtion Clinic, Warfarin sodium 3 mg tabs: Use as directed by Anticoagualtion Clinic.  The target INR is 2.0-3.0.  The next INR is due 01/12/2011.  Anticoagulation instructions were given to patient.  Results were reviewed/authorized by Bethena Midget, RN, BSN.  She was notified by Bethena Midget, RN, BSN.         Prior Anticoagulation Instructions: INR 2.0 Today take 3mg s then resume 2mg s everyday except 3mg s on Mondays, Thursdays and Saturdays.  Recheck in 4 weeks.   Current Anticoagulation Instructions: INR 2.3 Continue 2mg s everyday except 3mg s on Mondays, Thursdays and Saturdays. Recheck in 4 weeks.

## 2010-12-28 NOTE — Letter (Signed)
Summary: Device-Delinquent Phone Journalist, newspaper, Main Office  [address]   [place], [place] [address]   Phone: [phone]  Fax: [phone]     December 20, 2010 MRN: [id number]   [facility] [address] [place], [place]  [address]   Dear Ms. Schupp,  According to our records, you were scheduled for a device phone transmission on 12-16-2010.     We did not receive any results from this check.  If you transmitted on your scheduled day, please call us to help troubleshoot your system.  If you forgot to send your transmission, please send one upon receipt of this letter.  Thank you,   Architectural technologist [facility]

## 2011-01-12 ENCOUNTER — Encounter (INDEPENDENT_AMBULATORY_CARE_PROVIDER_SITE_OTHER): Payer: Medicare Other

## 2011-01-12 ENCOUNTER — Encounter: Payer: Self-pay | Admitting: Cardiovascular Disease

## 2011-01-12 DIAGNOSIS — Z7901 Long term (current) use of anticoagulants: Secondary | ICD-10-CM

## 2011-01-12 DIAGNOSIS — I4891 Unspecified atrial fibrillation: Secondary | ICD-10-CM

## 2011-01-17 ENCOUNTER — Encounter: Payer: Self-pay | Admitting: *Deleted

## 2011-01-18 NOTE — Medication Information (Signed)
Summary: rov/tm  Anticoagulant Therapy  Managed by: Cloyde Reams, RN, BSN Referring MD: Graciela Husbands PCP: Dale [place], MD Supervising MD: Mariah Milling  Indication 1: Atrial Fibrillation Indication 2: Pacemaker 101 Lab Used: [facility] [place] Site: [place] INR POC 2.2 INR RANGE 2.0-3.0  Dietary changes: no    Health status changes: no    Bleeding/hemorrhagic complications: no    Recent/future hospitalizations: no    Any changes in medication regimen? no    Recent/future dental: no  Any missed doses?: no       Is patient compliant with meds? yes       Allergies: 1)  ! * Levaquin  Anticoagulation Management History:      The patient is taking warfarin and comes in today for a routine follow up visit.  Positive risk factors for bleeding include an age of 75 years or older.  The bleeding index is 'intermediate risk'.  Positive CHADS2 values include Age > 75 years old.  Anticoagulation responsible provider: Marieelena Bartko .  INR POC: 2.2.  Cuvette Lot#: [id number].  Exp: 11/2011.    Anticoagulation Management Assessment/Plan:      The patient's current anticoagulation dose is Warfarin sodium 2 mg tabs: Use as directed by Anticoagualtion Clinic, Warfarin sodium 3 mg tabs: Use as directed by Anticoagualtion Clinic.  The target INR is 2.0-3.0.  The next INR is due 02/09/2011.  Anticoagulation instructions were given to patient.  Results were reviewed/authorized by Cloyde Reams, RN, BSN.  She was notified by Cloyde Reams RN.         Prior Anticoagulation Instructions: INR 2.3 Continue 2mg s everyday except 3mg s on Mondays, Thursdays and Saturdays. Recheck in 4 weeks.   Current Anticoagulation Instructions: INR 2.2  Continue on same dosage 2mg  daily except 3mg  on Mondays, Thursdays, and Saturdays. Recheck in 4 weeks.

## 2011-01-25 ENCOUNTER — Encounter: Payer: Self-pay | Admitting: Cardiovascular Disease

## 2011-01-25 DIAGNOSIS — I4891 Unspecified atrial fibrillation: Secondary | ICD-10-CM

## 2011-01-27 ENCOUNTER — Ambulatory Visit: Payer: Self-pay | Admitting: Unknown Physician Specialty

## 2011-01-27 NOTE — Cardiovascular Report (Signed)
Summary: Office Visit   Office Visit   Imported By: Roderic Ovens 01/19/2011 09:42:41  _____________________________________________________________________  External Attachment:    Type:   Image     Comment:   External Document

## 2011-01-27 NOTE — Letter (Signed)
Summary: Remote Device Check  [employer], Main Office  [address]   [place], [place] [address]   Phone: [phone]  Fax: [phone]     January 17, 2011 MRN: [id number]   [facility] [address] [place], [place]  [address]   Dear Ms. Peets,   Your remote transmission was recieved and reviewed by your physician.  All diagnostics were within normal limits for you.  __X____Your next office visit is scheduled for: May 2012 with Dr Graciela Husbands. Please call our office to schedule an appointment.    Sincerely,  Vella Kohler

## 2011-02-09 ENCOUNTER — Ambulatory Visit (INDEPENDENT_AMBULATORY_CARE_PROVIDER_SITE_OTHER): Payer: Medicare Other | Admitting: Emergency Medicine

## 2011-02-09 DIAGNOSIS — Z7901 Long term (current) use of anticoagulants: Secondary | ICD-10-CM | POA: Insufficient documentation

## 2011-02-09 DIAGNOSIS — I4891 Unspecified atrial fibrillation: Secondary | ICD-10-CM

## 2011-03-04 ENCOUNTER — Ambulatory Visit: Payer: Self-pay | Admitting: Internal Medicine

## 2011-03-08 ENCOUNTER — Other Ambulatory Visit: Payer: Self-pay

## 2011-03-08 MED ORDER — WARFARIN SODIUM 2 MG PO TABS: 2.0000 mg | ORAL_TABLET | ORAL | Status: DC

## 2011-03-09 ENCOUNTER — Ambulatory Visit (INDEPENDENT_AMBULATORY_CARE_PROVIDER_SITE_OTHER): Payer: Medicare Other | Admitting: Emergency Medicine

## 2011-03-09 DIAGNOSIS — I4891 Unspecified atrial fibrillation: Secondary | ICD-10-CM

## 2011-03-15 NOTE — Letter (Signed)
Mar 19, 2007    [name]  [address]  [place], [place] [address]   RE:  [name], [name]  MRN:  [id number]  /  DOB:  [birth year]   Dear Westley Hummer,   I hope this letter finds this well and hope that you are looking forward  to the summer.   It was a pleasure seeing [name] today.  She is doing much better.  She has more energy and feels more like doing stuff.  Of note, she has  episodes where she feels like her heart is thumping, both in her  stomach, as well as having something in her neck.  We have not been able  to reproduce this with high-voltage output pacing, though that does not  leave me sanguine that it is not related to the device.  She thinks  maybe it is getting better, but she being an optimist, it is a little  hard to tell.  I have suggested that if we cannot clarify it using the  data that is stored in the device which will have to be sent off to  [facility] for further interrogation, then maybe an event recorder will  help [place] understand what this is.  Could it be PVCs versus some type of  pacemaker function is the big question.   PHYSICAL EXAMINATION:  VITAL SIGNS:  Her heart rate is well controlled.  Her blood pressure is doing fine.  It is 118/70 today.  LUNGS:  Clear.  CARDIAC:  Heart sounds were regular.   IMPRESSION:  1. Atrial fibrillation, recurrent, and paroxysmal, coming to about 45%      of the time.  2. Heart failure associated with #1, now status post atrioventricular      junction ablation.  3. Previously implanted Medtronic pacemaker.  4. Thumping in the abdomen and the neck, question cause.   We will plan to cut her atenolol in half.  When I see her again in about  four weeks, we will decide whether to discontinue her Tikosyn or not.  My bias is to do so, given the relative lack of efficacy.  Further, if  she continues to have the thumping, will need to utilize an event  recorder if the data from Medtronic is not otherwise  helpful.   She asked about following her potassium.  If you would be willing to do  that, I would be much obliged.    Sincerely,      Duke Salvia, MD, [facility]  Electronically Signed    SCK/MedQ  DD: 03/19/2007  DT: 03/20/2007  Job #: [id number]

## 2011-03-15 NOTE — Letter (Signed)
September 08, 2008    [name]  [address]  [place], [place] [address]   RE:  KYMBERLI, WIEGAND  MRN:  [id number]  /  DOB:  10-27-1928   Dear Westley Hummer,   Mrs. Honda comes in today in followup for her pacemaker implanted in the  setting of permanent atrial arrhythmias.  She is status post AV junction  ablation that was associated with marked improvement.  She was seen a  couple of weeks ago because of palpitations and it turned out that she  had spontaneously reverted to sinus rhythm and her device was programmed  in a non-tracking mode.  This was reprogrammed to DDD mode and symptoms  improved.   Symptoms are stable at this point.  She has no significant exercise  intolerance or shortness of breath and no peripheral edema.   Her medications include Norvasc 5, TriCor 145 prescribed, but not yet  started for hypertriglyceridemia, Coumadin, and folic acid as well as  Nexium.   On examination, her blood pressure today was 145/70 with a pulse of 73.  Her weight was 130 which was stable.  Her neck veins were flat.  Her  lungs were clear.  Her heart sounds were regular without murmurs and the  extremities had no edema.   Interrogation of Medtronic EnRhythm device demonstrated subsequent  redevelopment of atrial fibrillation.  She has no intrinsic ventricular  rhythm.  The impedance was 512 and the threshold was 1 V at 0.4.  She is  100% ventricularly paced.   IMPRESSION:  1. Atrial fibrillation - persistent.  2. Status post atrioventricular junction ablation.  3. Status post pacer for the above.  4. Modest hypertension.  5. Hypertriglyceridemia, for which TriCor was started for which her co-      pay is 70 dollars a month and beyond her budget.   Joyce, I mentioned to her that it might be worth her talking to you  about not niacin as an alternative to the TriCor.   She is otherwise doing well.  We will plan to see her again in 6 months'  time.     Sincerely,      Duke Salvia, MD, [facility]  Electronically Signed    SCK/MedQ  DD: 09/08/2008  DT: 09/09/2008  Job #: [id number]

## 2011-03-15 NOTE — Assessment & Plan Note (Signed)
[facility] OFFICE NOTE   [name], [name]                          MRN:          [id number]  DATE:08/08/2008                            DOB:          1928-07-18    Please have that note CCed to Dr. Berton Mount as well as to Dr. Dale Ponce in [place].     [name], MD     DM/MedQ  DD: 08/08/2008  DT: 08/09/2008  Job #: [id number]   cc:   [name], MD, [facility]

## 2011-03-15 NOTE — Letter (Signed)
October 16, 2007    Dale [place], MD  [address]  [place], [place] [address]   RE:  [name], [name]  MRN:  [id number]  /  DOB:  [birth year]   Dear Westley Hummer:   It was a pleasure talking to you today.  Again, have a very [facility].   [name] is doing all right from a rhythm point of view.  As we talked  about, she is also having this cough which has been progressive and a  problem.  She mentioned that she is undergoing an evaluation for a  cystic lesion in her pancreas.  Apparently she has some mild LFT  abnormalities as well.  Her Myoview was normal after the rather  discombobulating undertaking.   Her medications include the Coumadin, Protonix, Norvasc, and Nexium.   On examination, her blood pressure was 134/54, her pulse was 62.  LUNGS:  Clear.  Her heart sounds were regular.  EXTREMITIES:  Without edema.   Interrogation of her pacemaker demonstrated normal pacemaker function.   IMPRESSION:  1. Atrial fibrillation, status post atrioventricular junction      ablation.  2. Bradycardia, status post pacemaker.  3. Atypical chest pain.  4. Probable bronchitis.  5. Cystic lesion on her pancreas.   As we talked about, I put [name] on Omnicef 300 mg to take twice  daily.  We will see her again as previously scheduled for her device  follow-up.  Please let me know what the CT scan evaluation of her  pancreas shows.    Sincerely,      Duke Salvia, MD, [facility]  Electronically Signed    SCK/MedQ  DD: 10/16/2007  DT: 10/18/2007  Job #: [id number]

## 2011-03-15 NOTE — Letter (Signed)
Mar 05, 2007    Dale [place], MD  [address]  [place], [place] [address]   RE:  Joyce, Snyder  MRN:  [id number]  /  DOB:  [birth year]   Dear Westley Hummer,   [name] came in with her daughter today. She has been doing pretty  well. She has had some palpitations. This is interesting because on  interrogation of her pacemaker, she is 100% ventricularly paced. The  only way I can interpret this is that when she is having atrial  fibrillation she may be having some episodes where she is under  sensing and so she tracks her atrial fibrillation at a more rapid rate  and pacing faster than she should. To that end, I have reprogrammed her  device to try to ameliorate this. She is to let me know in the next week  or two how she is feeling.   Thanks again for allowing [place] to participate in her care.    Sincerely,      Duke Salvia, MD, [facility]  Electronically Signed    SCK/MedQ  DD: 03/05/2007  DT: 03/06/2007  Job #: [id number]

## 2011-03-15 NOTE — Assessment & Plan Note (Signed)
[facility]                            [name] OFFICE NOTE   DJUANA, [name]                          MRN:          [id number]  DATE:08/08/2008                            DOB:          12-11-1927    PRIMARY CARE PHYSICIAN:  Dale Kronenwetter   HISTORY OF PRESENT ILLNESS:  [name] with a history of  sick sinus syndrome and atrial fibrillation status post an AV node  ablation who additionally has a history of stable non-Hodgkin lymphoma,  hypertension, hypercholesterolemia who presents today to Cardiology  Clinic for evaluation of new onset shortness of breath with exertion.  The patient was last seen in the office here by Dr. Graciela Husbands back in  December 2008.  She had been doing quite well up until 2 weeks ago.  Two  2 weeks ago, the patient states that she began to develop shortness of  breath on exertion which has been slowly progressive over that period of  time.  At this point, she is becoming short of breath after walking  about a block on flat ground.  She also notices it when carrying things  into her house like her groceries and when she does heavy housework.  Just walking around in her house or walking short distances does not get  her short of breath.  This is actually new and she had not noted these  symptoms prior to 2 weeks ago.  She additionally reports an achy feeling  in her chest.  This usually happens at rest and is not associated with  dyspnea on exertion.  It is quite mild and has been going on for over a  year.  She denies any cough, fever or congestion.  She denies melena or  bright red blood per rectum.  She has her pacemaker checked  transtelephonically, last time was about 3 months ago and it was  functioning normally per her report.  She has had no episodes of syncope  or presyncope and she has noted no palpitations.   PAST MEDICAL HISTORY:  1. Sick sinus syndrome with atrial fibrillation.  The patient is  status post AV nodal ablation due to permanent and symptomatic      atrial fibrillation. This AV nodal ablation was in 2008.  2. Non-Hodgkin lymphoma which is stable.  She is followed by an      oncologist.  3. History of nonobstructive coronary artery disease on heart      catheterization done over 5 years ago.  4. The patient does have a Medtronic dual-chamber pacemaker.  5. Hypertension.  6. Hypercholesterolemia.  7. History of hereditary hemochromatosis.  The patient was      phlebotomized in the past.  She is not getting phlebotomy now.  8. Gastroesophageal reflux disease.   SOCIAL HISTORY:  The patient has no history of smoking.  She uses  alcohol rarely.   EKG today shows sinus rhythm with complete heart block and ventricular  pacing.  There is occasional atrial pacing as well indicating this is a  dual-chamber pacemaker.  There is no tracking  of the sinus p waves.   MEDICATIONS:  1. Coumadin.  2. Protonix 40 mg daily.  3. Gabapentin 100 mg b.i.d.  4. Nexium 40 mg b.i.d.  5. Norvasc 5 mg daily.  6. Fish oil 1000 mg three times a day.   PHYSICAL EXAMINATION:  VITAL SIGNS:  Blood pressure 136/66, heart rate  is 61 and regular, weight is 139 pounds.  GENERAL:  This is a well-developed elderly female in no apparent  distress.  NEUROLOGICAL:  Alert and oriented x3.  Normal affect.  HEENT:  Normal.  NECK:  There is no JVD.  There is no thyromegaly or thyroid nodule.  LUNGS:  Clear to auscultation bilaterally with normal respiratory  effort.  HEART:  Regular S1 and S2.  There is a 2/6 early peaking systolic murmur  at the right upper sternal border, S2 is heard clearly.  There is no S3.  There is no S4.  There is no carotid bruit.  EXTREMITIES:  There is no peripheral edema.  There are 2+ dorsalis pedis  pulses bilaterally.  ABDOMEN:  Soft, nontender.  No hepatosplenomegaly.   ASSESSMENT AND PLAN:  [name] with a history of sick sinus  syndrome and atrial  fibrillation status post atrioventricular nodal  ablation in 2008 with placement of a Medtronic dual-chamber pacemaker  who presents to clinic for evaluation of new onset shortness of breath.  1. Dyspnea on exertion.  The patient's exam is quite benign.  She does      not appear volume overloaded.  There are a number of possible      explanations for her shortness of breath on exertion.  I will have      her pacemaker interrogated as this has not been done for a while.      She is now in sinus rhythm and does not appear to be tracking her p      waves.  We will draw her blood to check a CBC to see if there is      any evidence for anemia and we will also check a TSH.  We will look      for any evidence of congestive heart failure.  There is no volume      overload on exam.  We will have a BNP drawn today.  Additionally,      she does have a right upper sternal border murmur.  This does sound      benign like aortic sclerosis or mild aortic stenosis at the most;      however, we will obtain an echocardiogram as I do not see that one      has been done in the system and finally, given the new onset      shortness of breath with exertion and her atypical chest pain, we      will go ahead and order an adenosine Myoview since she is      permanently pacing.  2. Hypercholesterolemia.  The patient is only on fish oil.  We will      check her cholesterol today when we draw the rest of her labs.  3. The patient will follow up with Dr. Graciela Husbands or myself in a week to      collect the results of these tests.     [name], MD  Electronically Signed    DM/MedQ  DD: 08/08/2008  DT: 08/09/2008  Job #: [id number]   cc:   Sherol Dade  C. Graciela Husbands, MD, [facility]

## 2011-03-15 NOTE — Letter (Signed)
April 30, 2007    Charlene Scott  [address]  [place], [place] [address]   RE:  JASSLYN, FINKEL  MRN:  [id number]  /  DOB:  [birth year]   Dear Westley Hummer:   Ms. Slone comes in feeling terrific.  She is dressed like the Fourth of  July.  She is having no complaints of chest pain or shortness of breath  and no thumping.   Interrogation of her pacemaker demonstrates she has had predominantly  atrial fibrillation in the last month.   Her blood pressure today was 120/66.  Her lungs were clear.  Heart  sounds were regular.   IMPRESSION:  1. Atrial fibrillation, now probably permanent.  2. Status post AV junction ablation for uncontrolled rates.  3. Status post pacer for #2.   Westley Hummer, Ms. Constantin feels terrific.  I am going to go ahead and stop her  Tikosyn today, go ahead and stop her atenolol today.  We will plan to  see her again in 6 months' time.  If you have any questions, please do  not hesitate to contact me.    Sincerely,      Duke Salvia, MD, [facility]  Electronically Signed   SCK/MedQ  DD: 04/30/2007  DT: 05/01/2007  Job #: [id number]

## 2011-03-15 NOTE — Assessment & Plan Note (Signed)
[facility] OFFICE NOTE   [name], [name]                          MRN:          [id number]  DATE:03/16/2009                            DOB:          04-18-1928    PRIMARY CARE PHYSICIAN:  Dale [place].   HISTORY OF PRESENT ILLNESS:  [name] with a history of  sick sinus syndrome and atrial fibrillation status post AV node ablation  who additionally has a history of stable non-Hodgkin lymphoma,  hypertension, and hyperlipidemia who presents today to Cardiology Clinic  for evaluation of neck pain.  The patient is seen as an acute add-on  visit today.  The patient typically follows up with Dr. Graciela Husbands.  She is  seen because of pain in her neck.  She says that she has had pain in her  posterior neck radiating to the right arm now for 2 weeks.  The pain has  actually been fairly constant and on further questioning, it seems to  have occurred after she carried a load of groceries in for one of her  neighbors who does not see very well.  After that, she did develop  soreness and pain in the trapezius muscle area on the posterior neck and  upper back.  This has been fairly constant as mentioned.  She has become  quite worried about this and wants to make sure that is not her heart.  Otherwise, the patient has been doing well in general.  No significant  shortness of breath on exertion.  She does not have any chest pain  except for occasional pinpricks of pain that last only about for 1  second.  They are sharp, and they tend to be under her left breast.  She  has had these for a long period of time and the pattern is unchanged.   EKG today is V-paced with underlying atrial fibrillation.   PAST MEDICAL HISTORY:  1. Sick sinus syndrome with atrial fibrillation.  The patient is      status post AV nodal ablation due to permanent and symptomatic      atrial fibrillation.  This AV nodal ablation was in  2008.  2. Non-Hodgkin lymphoma which is stable.  She is followed by an      oncologist.  3. History of nonobstructive coronary artery disease on heart      catheterization done over 5 years ago.  4. The patient has a Medtronic dual-chamber pacemaker.  5. Hypertension.  6. Hyperlipidemia.  7. History of hereditary hemochromatosis.  The patient was      phlebotomized in the past for this.  She is not getting phlebotomy      now.  8. Gastroesophageal reflux disease.  9. Most recent echo in October 2009.  EF was 55%.  There was mild LVH.      There were no significant valvular abnormalities.   SOCIAL HISTORY:  The patient has no history of smoking.  She uses  alcohol rarely.   MEDICATIONS:  1. Coumadin.  2. Nexium 40 mg b.i.d.  3.  Norvasc 5 mg daily.  4. Fish oil.  5. TriCor.  The patient should be taking TriCor, but she is not      actually taking it.   PHYSICAL EXAMINATION:  VITAL SIGNS:  Blood pressure is 130/68, heart  rate is 69 and regular.  GENERAL:  This is a well-developed elderly female in no apparent  distress.  NEUROLOGIC:  Alert and oriented x3.  Normal affect.  NECK:  There is no JVD.  There is no thyromegaly or thyroid nodule.  LUNGS:  Clear to auscultation bilaterally with normal respiratory  effort.  CARDIOVASCULAR:  Heart, regular S1 and S2.  There is a 2/6 early peaking  systolic murmur at the right upper sternal border.  S2 is heard clearly.  There is no S3.  There is no S4.  There is no carotid bruit.  There is  trace ankle edema.  MUSCULOSKELETAL:  The patient is tender to palpation over the trapezius  muscle ridge of the right shoulder and posterior neck.   ASSESSMENT AND PLAN:  [name] with history of sick sinus  syndrome and atrial fibrillation, status post AV nodal ablation in 2008  with placement of a Medtronic dual-chamber pacemaker who presents to  Cardiology Clinic for evaluation of neck pain.  1. Neck pain.  The patient came as an  acute visit today because she      was concerned that her neck pain could be related to her heart.  On      examination and questioning, it does appear that this is probably      musculoskeletal in nature.  It has been going on for 2 weeks      constantly.  Her right trapezius muscle is tender.  It did develop      after carrying a heavy load of groceries for her neighbor.  I do      think that she might benefit from a low-dose of a muscle relaxer.      I will give her cyclobenzaprine 5 mg t.i.d. p.r.n.  I did warn her      that this can sedate her.  She does not need to drive when she is      taking it, and she needs to be very careful with it.  I think it      may help resolve the pain some.  2. Atrial fibrillation.  The patient is status post AV nodal ablation.      She is in atrial fibrillation today.  Her pacemaker appears to be      functioning appropriately.  3. Hypertension.  The patient's blood pressure is under good control.  4. The patient's followup is scheduled with Dr. Sherryl Manges.     [name], MD  Electronically Signed    DM/MedQ  DD: 03/16/2009  DT: 03/17/2009  Job #: [id number]   cc:   Dale [place]

## 2011-03-18 NOTE — Letter (Signed)
January 30, 2007    [name]  [address]  [place], [place] [address]   RE:  [name], [name]  MRN:  [id number]  /  DOB:  Sep 06, 1928   Dear Westley Hummer:   I hope you had a great Easter party.   [name] came in today feeling a little bit run down.  Her echo last  week showed preserved left ventricular function, which is important.   She is unaware of her tachycardia.   I went back through the chart and reviewed and she has undergone a  couple of different ablation procedures for the atrial flutter which is  what her electrocardiogram demonstrated today.  On the last one, which  was in August of 2006, we had found a diverticulum in the base of the  atrial flutter circuit which when we applied energy eliminated the  flutter almost immediately.  However, as noted, she has had a  recurrence.  Furthermore, the recurrence has been persistent since about  January.   I was able to pace terminate it today but it will recur.  We talked  about treatment options including:  1. Augmented AV nodal blockade which I think is less likely to be      successful in atrial fibrillation.  2. Tikosyn therapy which may help with control of both the      fibrillation as well as the flutter.  3. Repeat ablation procedure either an atrial flutter procedure or an      atrial flutter/fibrillation procedure, the latter of which would      probably prompt [place] to send her to [facility] and for AV      nodal ablation which is something she is slow to pursue.   After discussing this, we decided to pursue with Tikosyn.  We will check  her potassium, magnesium today as these need to be in order for [place] to be  able to administer the drug.   I should also let you know that her electrocardiogram obtained today  when she was in sinus rhythm had a QT interval of only about 410  milliseconds, which is terrific, and she was atrial paced.   She is planning to be admitted to [facility] on April 11th for a  72 hour  hospitalization to get the Tikosyn initiated.   Joyce, thanks very much for asking me to see her.    Sincerely,      [facility] Salvia, MD, [facility]  Electronically Signed    SCK/MedQ  DD: 01/30/2007  DT: 01/30/2007  Job #: [id number]

## 2011-03-18 NOTE — Op Note (Signed)
NAMEEARNESTEEN, [name]                 ACCOUNT NO.:  [id number]   MEDICAL RECORD NO.:  [id number]          PATIENT TYPE:  OIB   LOCATION:  2899                         FACILITY:  [facility]   PHYSICIAN:  [facility] Salvia, M.D.  DATE OF BIRTH:  1928/02/09   DATE OF PROCEDURE:  05/09/2005  DATE OF DISCHARGE:                                 OPERATIVE REPORT   PREOPERATIVE DIAGNOSIS:  Atrial flutter, with previous ablation,  now with  clinical recurrence.   POSTOPERATIVE DIAGNOSIS:  Atrial flutter, with previous ablation,  now with  clinical recurrence.   PROCEDURE:  Invasive electrophysiological study, arrhythmia mapping with  electroanatomical system, RF catheter ablation, and pacemaker interrogation.   Following obtaining informed consent, the patient was brought to the  electrophysiology laboratory and placed on the fluoroscopic table in the  supine position.  INR was 2.9.  After routine prep and drape, cardiac  catheterization was performed with local anesthesia and conscious sedation.  Noninvasive blood pressure monitoring and transcutaneous oxygen saturation  monitoring and end-tidal CO2 monitoring were performed continuously  throughout the procedure.  Following the procedure, the catheters were  removed.  Hemostasis was obtained, and the patient was transferred to the  floor in stable condition.   An 8 French 8 mm deflectable tip ablation catheter was inserted via the  right femoral vein to create a geometry in the right atrium, also to measure  the His bundle electrogram at the AV junction, and then to mapping sites in  the posterior septal space.   An ESI E3000 catheter was inserted via the left femoral vein to the mid-  right atrium.   Surface leads I, aVF, and V1 were monitored continuously throughout the  procedure.  Following insertion of the catheters, the stimulation protocol  included incremental atrial pacing with electroanatomical mapping,  ventricular pacing via the  patient's pacemaker.   RESULTS:   SURFACE ELECTROCARDIOGRAM:  INITIAL:  Rhythm atrial flutter; AA cycle  length:  306 msec; RR interval:  615 msec; PR interval N/A; QRS duration:  93 msec; QT interval:  318 msec; P wave duration N/A; AH interval:  115  msec; HV interval:  36 msec; a bundle branch block was absent and  preexcitation was absent.  FINAL MEASUREMENTS:  Rhythm is sinus; RR interval:  987 msec; PR interval  138 msec; QRS duration:  102 msec; QT interval:  454 msec; P wave duration:  115 msec; bundle branch block was absent, preexcitation was absent.  AV Wenckebach was 400 msec.  VA Wenckebach was 350 msec.   Arrhythmia was induced.  The patient presented to the electrophysiological  laboratory in atrial flutter.  Electroanatomical mapping demonstrated  counterclockwise atrial flutter rotation with a breakthrough near the valve.  During catheter manipulation, the flutter was terminated.  Pacing on both  sides of the previous applied line demonstrated that there was block from  the medial to the lateral side, but there was conduction of lateral to  medial.   RADIOFREQUENCY ENERGY:  A total of 3 minutes and 13 seconds of RF energy  were applied in electroanatomical  targets identified by the mapping and the  conduction breakthrough.  Thereafter, no evidence of lateral to medial  conduction was evident.   Attempts to induce atrial flutter with rapid atrial pacing also failed to  induce any atrial arrhythmias down to a paced cycle length of 250 msec.   FLUOROSCOPY TIME:  A total of 7 minutes and 20 seconds of fluoroscopy was  utilized at 7-1/2 frames/second.   IMPRESSION:  1.  Normal sinus function.  2.  Abnormal atrial function manifested by sustained atrial flutter,      successfully ablated using electroanatomical mapping, with breakthrough      noted to be right at the valve.  3.  Normal atrioventricular nodal function.  4.  Normal His-Purkinje system function.  5.   No accessory pathway.  6.  Normal ventricular response to programmed stimulation.   SUMMARY AND CONCLUSION:  The results of the electrophysiological testing and  electroanatomical mapping confirmed conduction block demonstrated medial to  lateral, but presence of conduction from lateral to medial, which was the  direction of her atrial flutter.  Conduction occurred at the valve level and  conduction block was accomplished by ablation at that site.  Thereafter,  bidirectional block was demonstrated.   The patient tolerated the procedure well.  The patient will be observed  overnight.  Amiodarone doses will be decreased from 200 b.i.d. to 200 mg  once a day.       SCK/MEDQ  D:  05/09/2005  T:  05/09/2005  Job:  [id number]   cc:   Iantha Fallen A. Lady Gary, M.D.  [facility]   Electrophysiology Laboratory

## 2011-03-18 NOTE — Op Note (Signed)
NAMEANASTACIA, [name]                 ACCOUNT NO.:  [id number]   MEDICAL RECORD NO.:  [id number]          PATIENT TYPE:  OIB   LOCATION:  3707                         FACILITY:  [facility]   PHYSICIAN:  Doylene Canning. Ladona Ridgel, M.D.  DATE OF BIRTH:  Jan 18, 1928   DATE OF PROCEDURE:  02/18/2005  DATE OF DISCHARGE:                                 OPERATIVE REPORT   PROCEDURE PERFORMED:  Electrophysiologic study and radiofrequency catheter  ablation of atrial flutter.   ATTENDING:  Doylene Canning. Ladona Ridgel, M.D.   I. INTRODUCTION:  The patient is a 77 year old woman with a history of tachy-  brady syndrome and atrial fibrillation, and longstanding hypertension.  She  recently underwent pacemaker generator change and was begun on amiodarone  secondary to her atrial fibrillation.  The patient subsequently developed  symptomatic atrial flutter associated with dizziness and shortness of  breath.  This is despite amiodarone therapy.  She is now referred for  electrophysiologic study and catheter ablation.   II. PROCEDURE:  After informed consent was obtained, the patient was taken  to the diagnostic EP lab in the fasting state.  After the usual preparation  and draping, intravenous fentanyl and midazolam were given for sedation.  A  6-French hexapolar catheter was inserted percutaneously into the right  jugular vein and advanced to the coronary sinus.  A 7-French 20-pole Halo  catheter was inserted percutaneously into the right femoral vein and  advanced to the right atrium.  A 5-French quadripolar catheter was inserted  percutaneously into the right femoral vein and advanced to the right atrium.  This was placed in the His bundle region.  After measurement of basic  intervals, mapping was carried out, demonstrating typical atrial flutter.  The ablation catheter was then maneuvered into the atrial flutter isthmus.  Additional mapping demonstrated massively enlarged atrial flutter amplitude  electrograms in the  right atrium.  A total of 30 bonus RF energy  applications were subsequently delivered resulting in termination of atrial  flutter and restorations of sinus rhythm with the creation of bidirectional  block in the atrial flutter isthmus.  The procedure was made much more  difficult than usual secondary to the very massive atrial electrogram  amplitudes.  However, despite this bidirectional block, termination of  atrial flutter was achieved.  Rapid ventricular pacing was then carried out  from the RV apex, demonstrating V-A Wenckebach cycle length of 360  milliseconds.  Programmed ventricular stimulation was carried out from the  RV apex at the basic drive cycle length of 914 milliseconds.  The S1-S2  interval was stepwise decreased down to 250 milliseconds, where ventricular  refractoriness was observed.  During programmed ventricular stimulation, the  atrial activation was midline and decremental.  Next, rapid atrial pacing  was carried out from the coronary sinus down to 390 milliseconds, resulting  in A-V Wenckebach.  During rapid atrial pacing, there was no inducible SVT.  Next, programmed atrial stimulation was carried out from the coronary sinus  as well as the high right atrium at basic drive cycle lengths of 782  milliseconds.  The S1-S2  interval was stepwise decreased from 540  milliseconds down to 300 milliseconds, where the A-V node ERP was observed.  During programmed atrial stimulation, there no A-H jumps and no echo beats  noted.  At this point, the catheters were removed, hemostasis was assured,  and the patient was returned to her room in satisfactory condition.   III. COMPLICATIONS:  There were no immediate procedure complications.   RESULTS:  A. Baseline ECG:  Baseline ECG demonstrates atrial flutter with  2:1 A-V conduction.  B. Baseline intervals:  The atrial flutter cycle length was 305  milliseconds, the H-V interval 39 milliseconds.  C. Rapid ventricular pacing:   Following catheter ablation, rapid ventricular  pacing demonstrated a V-A Wenckebach cycle length of 360 milliseconds.  During rapid ventricular pacing, the atrial activation was midline and  decremental.  D. Programmed ventricular stimulation:  Programmed ventricular stimulation  was carried out the RV apex at basic drive cycle length of 161 milliseconds.  The S1-S2 interval was stepwise decreased down to 250 milliseconds, where  ventricular refractoriness was observed.  During programmed ventricular  stimulation, the atrial activation was midline and decremental.  E.  Programmed atrial stimulation:  Programmed atrial stimulation was  carried out from the coronary sinus and the high right atrium at basic drive  cycle length of 096 milliseconds.  The S1-S2 interval was stepwise decreased  down to 300 milliseconds, where the A-V node ERP was observed.  During  programmed atrial stimulation, there were no A-H jumps and no echo beats.  F. Rapid atrial pacing:  Rapid atrial pacing was carried out from the  coronary sinus as well as from the high right atrium at pacing cycle length  of 600 milliseconds and stepwise decreased down to 390 milliseconds, where A-  V Wenckebach was observed.  During rapid atrial pacing, the P-R interval was  less than the R-R interval and there was no inducible SVT.  G.  Arrhythmias observed:  1.  Atrial flutter initiation:  Rapid atrial pacing duration was sustained.      Termination was catheter ablation.      1.  Mapping:  Mapping of atrial flutter isthmus demonstrated massive          atrial flutter amplitude in the atrial flutter isthmus.          1.  RF energy application:  A total of 30 RF energy applications              were delivered to the usual atrial flutter isthmus resulting in              termination of atrial flutter, restoration of sinus rhythm, and             correction of bidirectional block in the atrial flutter isthmus.   V. CONCLUSION:   This study demonstrated successful electrophysiologic study  and radiofrequency catheter ablation of typical atrial flutter with a total  of 30 radiofrequency energy applications delivered to the usual atrial  flutter isthmus resulting in termination of atrial flutter, restoration of  sinus rhythm, and creation of bidirectional block in atrial flutter isthmus.      GWT/MEDQ  D:  02/18/2005  T:  02/19/2005  Job:  [id number]   cc:   Mariel Kansky MD   [facility] Salvia, M.D.

## 2011-03-18 NOTE — Discharge Summary (Signed)
Joyce, Snyder                 ACCOUNT NO.:  [id number]   MEDICAL RECORD NO.:  [id number]          PATIENT TYPE:  OIB   LOCATION:  3742                         FACILITY:  [facility]   PHYSICIAN:  D[facility] Salvia, M.D.  DATE OF BIRTH:  02/17/1928   DATE OF ADMISSION:  05/09/2005  DATE OF DISCHARGE:  05/10/2005                                 DISCHARGE SUMMARY   DISCHARGE DIAGNOSES:  1.  Discharging day one status post successful radiofrequency catheter      ablation of recurrent atrial flutter.  2.  Recurrent atrial flutter with fatigue and dyspnea.  3.  Radiofrequency catheter ablation of atrial flutter in the winter of      2006.  4.  History of sinus node dysfunction/bradycardia, with pacemaker implant      and generator change March 2006, with replacement Medtronic EnRhythm      P1501 DR pulse generator placed by Dr. Sherryl Manges.   SECONDARY DIAGNOSES:  1.  History of low-grade lymphoma.  2.  Anemia.  3.  Gout.  4.  Gastroesophageal reflux disease.  5.  History of atrial fibrillation, greater than 10-year history.  6.  Prior cardiac catheterization at [facility] in 1995,      with normal coronary anatomy.  Ejection fraction 55-65% by stress test      in 2001.  7.  Mild to moderate mitral regurgitation and tricuspid regurgitation by      echocardiogram, 2006.  8.  Renal cysts.   PROCEDURE:  On May 09, 2005, electrophysiology study with successful  radiofrequency catheter ablation of atrial flutter, interrogation of  Medtronic pacemaker, Dr. Sherryl Manges, practitioner.   DISCHARGE DISPOSITION:  [name] is discharging day one after  radiofrequency catheter ablation of atrial flutter.  The patient is  maintained in sinus rhythm since the procedure with sinus rhythm and  intermittent atrial pacing.  The patient says she feels fine.  Her  temperature is 97.3; blood pressure 104/59; pulse 62 and regular;  respirations 20; oxygen saturation 94% on  room air.   Admission labs were obtained on May 02, 2005.  The complete blood count:  White cells 9.3, hemoglobin 14.1, hematocrit 40.2, platelets 373.  PT 23.7,  INR 2.6.  Basic metabolic panel:  Sodium 136, potassium 4.97, chloride 104,  bicarbonate 24.9, BUN 17, creatinine 1.1, glucose 113.   [name] is discharged on the following medications:  1.  Premarin 0.625 mg daily.  2.  Allopurinol 100 mg daily.  3.  Metoprolol 25 mg b.i.d.  4.  Elavil 10 mg daily at bedtime.  5.  Amiodarone 200 mg 1 tab daily.  This represents a new dose, down from      400 mg daily.  6.  Coumadin 1 mg to alternate with 2 mg daily.   She is asked not to drive for the next two days.   DISCHARGE DIET:  Low sodium, low cholesterol diet.   She is asked not to engage in any heavy lifting or straining for the next  two weeks.  [name] may shower.  She is to call [phone] if she  experiences pain or swelling at the catheterization sites.  For pain  control, Tylenol 325 mg 1-2 tabs q.4-6 h. as needed.  She follows up at  [facility], [address] Thursday May 19, 2005 at 3:30  in the [facility], and then she will see Dr. Graciela Husbands Monday  June 27, 2005 at 10:00 in the morning.  Of note, if she plans dental work  or even just teeth cleaning through December 2006, she is to call [phone]  for antibiotic coverage.   LONG-RANGE PLAN:  1.  Long-term plan for amiodarone.  If she stays in a sinus rhythm, we will      stop the amiodarone.  2.  Long-range plan for Coumadin.  If she stays in sinus rhythm for six      months, we will stop the Coumadin.   BRIEF HISTORY:  [name] is a 77 year old female with a history of  atrial flutter.  She underwent atrial flutter ablation by Dr. Ladona Ridgel,  electrophysiologist, in the winter of 2006.  She had a previously implanted  pacemaker which was a Guidant device, then underwent device revision to a  Medtronic EnRhythm with generator  change at about that time.  She has had  recurrent atrial arrhythmias.  These are manifested by fatigue and sob.  Interrogation of her pacemaker shows that she is out of rhythm about 45% of  the time, and when out of rhythm has heart rates above 100.   IMPRESSION:  Atrial flutter, status post ablation, with evidence or  recurrence.  She will have LFT, labs, and TSH prior to elective admission  for electrophysiology study with attempted radiofrequency catheter ablation  of recurrent atrial flutter.   HOSPITAL COURSE:  The patient presented electively to [facility] on May 09, 2005.  She underwent successful electrophysiology  study/radiofrequency catheter ablation by Dr. Sherryl Manges that same day.  She has had no postoperative complications.  Obtained sinus rhythm.  Has  been asymptomatic with regard to atrial tachyarrhythmias.  She will follow  up in [facility].      Debbora Lacrosse   GM/MEDQ  D:  05/10/2005  T:  05/10/2005  Job:  [id number]   cc:   [facility] Salvia, M.D.   Harold Hedge, M.D.  [address]  [facility]  [address]  [place]  [place] [address]  Fax: [phone]

## 2011-03-18 NOTE — Discharge Summary (Signed)
[name], [name]                 ACCOUNT NO.:  [id number]   MEDICAL RECORD NO.:  [id number]          PATIENT TYPE:  INP   LOCATION:  2013                         FACILITY:  [facility]   PHYSICIAN:  Duke Salvia, MD, FACCDATE OF BIRTH:  [birth year]   DATE OF ADMISSION:  02/09/2007  DATE OF DISCHARGE:  02/14/2007                               DISCHARGE SUMMARY   PRINCIPAL DIAGNOSES:  1. Admission for Tikosyn therapy.      a.     QT prolongation on 500 mcg b.i.d. and on 250 mcg b.i.d. of       Tikosyn.      b.     Home on Tikosyn 125 mcg b.i.d. and atenolol 25 mg in the       morning and 50 mg in the evening (the patient is hypotensive on 50       mg b.i.d. this admission).      c.     Spontaneous conversion of atrial flutter to sinus rhythm on       Tikosyn therapy.  2. Discharging day #1 status post AV node ablation February 13, 2007, Dr.      Graciela Husbands.  3. Home with AV pacing (sinus rhythm).   SECONDARY DIAGNOSES:  1. History of pacemaker implantation for sick sinus syndrome.  2. History of atrial flutter/atrial fibrillation.  3. Low grade non-Hodgkin's lymphoma.  4. Gastroesophageal reflux disease.  5. Gout.  6. Hypertension.  7. Raynaud's disease.  8. Hypertension.  9. Dyslipidemia.  10.Hemochromatosis.  11.Irritable bowel syndrome.  12.Pacemaker change-out in 2006.   PROCEDURE:  On February 13, 2007, [place] node ablation, Dr. Sherryl Manges.  The  patient tolerated the procedure well.  She is AV pacing in the post AV  node ablation.  Heart rate in the 60's.   BRIEF HISTORY:  Ms. Kulzer is a 77 year old female.  She has a history of  atrial flutter and has is particularly symptomatic, other than mild  fatigue with some dyspnea on exertion; at times she had some mild chest  discomfort.  She feels no palpitation or heart racing.   She has had two separate atrial flutter ablations on February 18, 2005 and  on May 09, 2005.  Now she has recurrence.  She saw Dr. Graciela Husbands on January 30, 2007 and  they decided on Tikosyn therapy.  She comes in today for  this on February 09, 2007.   ALLERGIES:  The patient had no known drug allergies.   LABORATORY DATA:  Her admission laboratories will be obtained and she  will start on Tikosyn therapy.   HOSPITAL COURSE:  The patient presented electively on February 09, 2007 for  Tikosyn therapy.  Her potassium and magnesium were within normal limits.  Her creatinine clearance was 70 and she was started on 500 mcg of  Tikosyn.  However, after the first dose it became apparent that the QT  interval had become prolonged and her dose the next day was reduced to  250 mcg twice daily.  It turns out that her QT interval was prolonged on  250  mcg and was reduced further to 125 mcg with the production of a  normal QT interval of about 440 milliseconds.  The patient was scheduled  for DCCV.  However, she converted easily to sinus rhythm after her first  dose of Tikosyn and the need for cardioversion was obviated. on 125  micrograms.  The patient, on 125 mcg of Tikosyn, however, did relapse  into atrial fibrillation but about 24 hours later snapped back into  sinus rhythm and she has maintained sinus rhythm since.  However, Dr.  Graciela Husbands in consultation, thought that in the future she would have  recurrences of AFib/flutter and recommended AV node ablation.  The  patient has been struggling with atrial fib/flutter for the last 2 years  and Dr. Graciela Husbands thought the best chance for her to have an appreciable  lifestyle would be to have AV node ablation and to continue Tikosyn and  atenolol therapy.  AV node ablation was done the day before discharge,  the patient discharging February 14, 2007, feeling well except for some  mild dizziness.  She did have some hypotension with atenolol 50 mg  b.i.d. and her dose has been actually held here for blood pressures in  the low 90's.  In addition, her Norvasc which she takes for Raynaud's,  was also held and she did have an  episode of Raynaud's affecting the  heels of both feet which turned to a dark purple; this color has now  disappeared and she feels that the attack of Raynaud's had dissipated.   DISCHARGE MEDICATIONS:  The patient discharged on the following  medications.  1. Tikosyn 125 mcg 1 tablet twice daily taken 12 hours apart.  2. Atenolol 25 mg tablets 1 tablet in the morning and 2 tablets in the      evening if blood pressure is greater than 100 systolic.  Otherwise,      take only 1 tablet in the evening.  3. Folic acid 1 mg daily.  4. Norvasc 2.5 mg daily.  5. Amitriptyline 110 mg at bedtime.  6. Coumadin 2 mg daily.   FOLLOWUP:  She follows up with Dr. Graciela Husbands at the [facility] office of  [facility] on Monday, Mar 05, 2007 at 2:15 p.m.   LABORATORY STUDIES THIS ADMISSION:  Complete blood count on February 09, 2007 - hemoglobin is 11.9, hematocrit is 33.3, white cells are 8.3, and  am unable to get a platelet count.  Serum electrolytes on February 14, 2007  -  sodium is 137, potassium is 5, chloride is 106, bicarbonate is 26,  BUN is 17, creatinine is 0.89 and glucose is 129.  On February 13, 2007 -  potassium was 5.7; this is probably a hemolyzed sample, as the patient  will hemolyze if the  blood is not run quickly and kept warm.  ProTime on February 13, 2007 was  26.2 and INR was 2.3.  Serum electrolytes - magnesium on February 14, 2007  was 2.5.   TIME SPENT:  Greater than 40 minutes at discharge.      Maple Mirza, [place]      Duke Salvia, MD, [facility]  Electronically Signed    GM/MEDQ  D:  02/14/2007  T:  02/14/2007  Job:  [id number]   cc:   Duke Salvia, MD, [facility]

## 2011-03-18 NOTE — H&P (Signed)
[name], [name] NO.:  [id number]   MEDICAL RECORD NO.:  [id number]          PATIENT TYPE:  EMS   LOCATION:  MAJO                         FACILITY:  [facility]   PHYSICIAN:  Rollene Rotunda, M.D.   DATE OF BIRTH:  Sep 02, 1928   DATE OF ADMISSION:  01/14/2005  DATE OF DISCHARGE:                                HISTORY & PHYSICAL   CARDIOLOGIST:  The patient has been seen by Dr. Graciela Husbands in consult in the  past.  Previously, she was followed by Dr. Lady Gary in [facility].   PRIMARY CARE DOCTOR:  Dr. Dale Bridgman at __________  Clinic in  [place].   CHIEF COMPLAINT:  Dizziness, weakness, short of breath.   HISTORY OF PRESENT ILLNESS:  This is a very pleasant, 77 year old, Caucasian  female with a greater than ten year history of atrial fibrillation.  She was  recently discharged from [facility] where she was  admitted for Rythmol loading.  The patient states she was discharged home in  sinus rhythm, however, was back into atrial fib within 24 hours of  discharge.  She called her cardiologist's office complaining of shortness of  breath, weakness, and dizziness, and the patient states she was told she  could not be seen for at least a week.  However, patient states her  dizziness, weakness, and shortness of breath has gradually increased over  the last week and a half since she was discharged.  She has periods of  paroxysmal nocturnal dyspnea.  She states she has been sleeping in a  recliner.  She also complains of chest pain which she describes as heaviness  as something is laying on her chest.  She states she is unable to take a  deep breath.  Currently, her heart rate is 140.  She is complaining of mild  chest tightness.  She denies any diaphoresis.  Negative for nausea or  vomiting.  Positive for dizziness.   ALLERGIES:  No known drug allergies, however, she states she has been  intolerant of SOTALOL in the past.   PAST  MEDICAL HISTORY:  1.  Prior cardiac catheterization at [facility] in 1995 in which the patient      states she was told she had clean arteries.  Last LVEF was 55-60% and      that was by stress test in 2001.  2.  Atrial fibrillation greater than ten year history.  3.  History of sinus BRADY leading to placement of a Guidant pacemaker at      [facility].  The patient states she had several complications with this      procedure including a pneumothorax and then pneumonia.  4.  History of recurrent tachy palpitations.  5.  Holter monitor, done by Dr. Lady Gary the results of which were sent to Dr.      Graciela Husbands, showed premature ventricular contractions, premature atrial      contractions, and a five-hour episode of atrial fibrillation.  6.  The patient recently had pacemaker interrogation done at our office when      she  was initially evaluated by Dr. Graciela Husbands, in February.  I do not have      the results of that available at the present time.  7.  Hyperlipidemia.  8.  GERD.  9.  Renal cyst.  10. Non-Hodgkin lymphoma.  11. Gout.  12. Mild to moderate mitral regurgitation and tricuspid regurgitation by      echo in 2006.   SOCIAL HISTORY:  She lives in [place] with her husband.  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  The patient is married.  She has two adult children alive and well. Denies any ETOH, tobacco, drug,  or herbal medication use.   MEDICATIONS:  1.  Coumadin 2 mg daily.  2.  Toprol XL 50 daily.  3.  Premarin 0.25 mg daily.  4.  Allopurinol 100 mg daily.  5.  Rythmol 150 mg p.o. q.8h.  The patient's last dose was at 6 a.m. this      morning.  6.  Elavil 10-20 mg p.o. q.h.s.   No diet restrictions.  Exercise:  She was quite active, however, her atrial  fibrillation over the last month or so has decreased her activity level.   REVIEW OF SYSTEMS:  Positive for chest pain, shortness of breath, dyspnea on  exertion, orthopnea, paroxysmal nocturnal dyspnea,  palpitations, presyncope.  NEURO/PSYCH:  Weakness generalized.   PHYSICAL EXAMINATION:  VITAL SIGNS:  Temp 97.7, pulse 130, respirations 18,  blood pressure 124/85, down to 106/70.  GENERAL:  She is alert and oriented, complaining of mild chest pain, very  cooperative and pleasant.  HEENT:  Pupils are equal, round and reactive.  NECK:  Supple without lymphadenopathy.  Negative bruit.  Negative JVD.  CARDIOVASCULAR:  Tachy rhythm, irregular at times.  LUNGS:  Clear to auscultation bilateral.  ABDOMEN:  Soft, nontender.  Positive bowel sounds.  EXTREMITIES:  No clubbing, cyanosis, or edema.  No spine or CVA tenderness.  NEURO:  She is alert and oriented x 3.  Cranial nerves II-XII grossly  intact.   Chest x-ray:  No acute findings.  EKG showing a rate of 138, atrial flutter  with a PR of 128, QRS of 190, and a QTC of 514.  Lab work is all pending at  the current time.  Dr. Rollene Rotunda in to see the patient.   PROBLEM LIST:  1.  A 2:1 flutter.  2.  Dyspnea.  3.  Dizziness, probably secondary to number one.   PLAN:  1.  Try to slow down the heart rate with diltiazem.  Currently a drip has      been initiated.  The patient possibly might need DC CV.  We will      continue her Rythmol.  If it has converted atrial fibrillation to atrial      flutter, we might ultimately be able to offer flutter ablation.  No      known history of coronary artery disease.  2.  The patient will be admitted to tele.  3.  We will cycle cardiac enzymes and check a BNP.  4.  Continue Coumadin therapy, pharmacy to regulate.  5.  Continue her home medications.    MB/MEDQ  D:  01/14/2005  T:  01/14/2005  Job:  [id number]

## 2011-03-18 NOTE — Discharge Summary (Signed)
[name], [name]                 ACCOUNT NO.:  [id number]   MEDICAL RECORD NO.:  [id number]          PATIENT TYPE:  OIB   LOCATION:  6533                         FACILITY:  [facility]   PHYSICIAN:  Duke Salvia, M.D.  DATE OF BIRTH:  Mar 25, 1928   DATE OF ADMISSION:  06/13/2005  DATE OF DISCHARGE:  06/14/2005                                 DISCHARGE SUMMARY   DISCHARGE DIAGNOSES:  Discharging day #1 status post electrophysiology  study/radiofrequency catheter ablation of counterclockwise atrial flutter,  creation of __________ bidirectional block by Dr. Sherryl Manges.   SECONDARY DIAGNOSES:  1.  History of atrial flutter status post radiofrequency catheter ablation      February 18, 2005, by Dr. Sharrell Ku.  2.  Redo electrophysiology study radiofrequency catheter ablation for      occurrence May 09, 2005.  3.  Atrial fibrillation with a 10-12 year history on chronic Coumadin,      intolerance of Sotalol, failed Rythmol within 24 hours, currently on      amiodarone therapy 200 mg daily BUT see discussion below for timing of      amiodarone therapy and reason for initiation.  4.  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  The patient      had generator change for device recall on January 18, 2005, with      implantation of a Medtronic EnRhythm.  5.  Low grade non-Hodgkin's lymphoma.  6.  Gastroesophageal reflux disease.  7.  Gout.  8.  Hypertension.  9.  Dyslipidemia.  10. Hemochromatosis.  11. Irritable bowel syndrome.   PROCEDURE:  June 13, 2005, electrophysiology study, successful  radiofrequency catheter ablation of counterclockwise atrial flutter.  This  was an isthmus ablation of counterclockwise atrial flutter. The patient will  stay on Coumadin for 6 months and will followup with Dr. Graciela Husbands in 4-6 weeks.  It looks, per the op note, that radiofrequency catheter ablation was  delivered at the isthmus diverticulum __________.   The patient discharges with  the following medications:  1.  Premarin 0.625 mg daily.  2.  Allopurinol 100 mg daily.  3.  Metoprolol 25 mg twice daily.  4.  Elavil 10 mg daily.  5.  Amiodarone 200 mg daily.  6.  Coumadin 1 mg Monday, Wednesday, Friday, Sunday; 2 mg Tuesday, Thursday,      Saturday.  Patient is to take 2 mg Coumadin today, Tuesday, August 15,      20 06.   DISCUSSION:  1.  Patient was first seen by Dr. Sherryl Manges on December 13, 2004.      Initial consult was for atrial fibrillation which had a 10-12 year      history and which was very symptomatic, causing the patient dyspnea,      chest pain, dizziness and fatigue.  At that time the patient was on      Sotalol and the patient seemed to be intolerant of this.  In addition,      her tachy palpitations have been progressively occurring to a couple      times  a week.  What we do know, from looking at the note, is that the      impression was paroxysmal atrial fibrillation with rapid ventricular      response and we also know the following:  I quote from the office note.      The patient is intolerant of higher doses of Sotalol, so we need other      options.  I have suggested getting a Cardiolite scan to see if there is      any progression of ischemic heart disease, as well as an echocardiogram      to look for evidence of  left ventricular hypertrophy and/or end      diastolic dysfunction which may have an impact on antiarrhythmic drug      therapy.  It seems at that time that perhaps amiodarone was on the      horizon as a treatment for her atrial fibrillation.  2.  The patient was hospitalized in  after this office visit for      Rythmol loading, but the patient was back into atrial fibrillation      within 24 hours.  3.  The patient was admitted to [facility] on January 14, 2005.  At that time she was dizzy, weak and dyspneic.  She was found to      have 2:1 atrial flutter.  She was started on Cardizem and the plan  was      to continue on Rythmol.  On the 21st, she had explanation of a pacemaker      and implantation of Medtronic EnRhythm P-1501 dual chamber pacemaker.      At that time we have discharge summary from this visit which lasted from      March 17-22, 2006, as follows, from the hospital course, Dr. Graciela Husbands was      consulted for a possibility of atrial flutter ablation.  He saw the      patient on March 20th.  The patient had been AV pacing throughout the      afternoon of January 16, 2005, and then at 8:30 in the evening broke into      atrial flutter with a heart rate in the 100-teens.  This continued      through to the morning hours of March 20th.  Dr. Graciela Husbands placed the      patient on amiodarone 400 mg every 8 hours with subsequent conversion to      sinus rhythm and the patient was maintaining sinus rhythm with A pacing      at a heart rate of 60 for the next 36 hours prior to discharge.  It      seems as if the patient were placed on amiodarone in the setting of      atrial flutter.  4.  On April 21st, the patient was admitted to [facility] for      electrophysiology study and radiofrequency catheter ablation of atrial      flutter by Dr. Sharrell Ku.  The introduction to that operative note is      as follows, The patient is a 77 year old female with a history of      tachybrady syndrome and atrial fibrillation.  She has longstanding      hypertension.  She recently underwent pacemaker generator change and was      begun on amiodarone secondary to atrial fibrillation.  The patient  subsequently developed symptomatic atrial flutter associated with      dizziness and shortness of breath despite amiodarone therapy.  She is      now referred for electrophysiology study and catheter ablation.  This      study was an electrophysiology study and radiofrequency catheter      ablation of typical atrial flutter with radiofrequency applied to the     usual atrial flutter isthmus,  creating a bidirectional block in the      atrial flutter isthmus.  5.  May 09, 2005, the patient admitted for recurrence of atrial flutter.      This patient had a history of abnormal atrial function manifested by      sustained atrial flutter previously successfully ablated using      electroanatomical mapping, but now with breakthrough noted to be at the      right of the valve.  At the time of this radiofrequency catheter      ablation the summary is as follows, The results of the      electrophysiology testing and electroanatomical mapping confirmed a      conduction block demonstrated medial to lateral, but there was a      presence of conduction from lateral to medial, which was the direction      of her atrial flutter.  Conduction occurred at the valve level and      conduction lock was accomplished by ablation at that site.  Thereafter,      bidirectional block was demonstrated.  The patient tolerated the      procedure well.  The patient will be observed over night.  Amiodarone      dose will be decreased from 200 b.i.d. to 200 mg daily.  Overall, with      regard to amiodarone, it seems that from the very first an      antiarrhythmic, such as amiodarone, was hypothesized for this patient      with atrial fibrillation of a 10-12 year period.  However, amiodarone      was not initiated until hospitalization in March of 2006, when the      patient presented with symptomatic 2:1 atrial flutter in a setting of      Rythmol therapy.  The patient was started on amiodarone in the setting      of atrial flutter and converted to sinus rhythm.  The patient      subsequently had recurrence of atrial flutter causing admission on April      21st for radiofrequency catheter ablation.  The patient was continued on      amiodarone at that time and once again had recurrence of atrial flutter      on May 09, 2005, with a second radiofrequency catheter ablation.  Once      again, the patient was  continued on amiodarone.  The patient's atrial      fibrillation has not been manifest since March, 2006 admission and      initiation of amiodarone.  I will leave for Dr. Graciela Husbands sifting through      these various elements of care as to whether the amiodarone was      initiated in an effort to control underlying atrial fibrillation.  There      is no doubt, however, that it did convert briefly atrial flutter to      sinus rhythm during hospitalization in March, 2006.  On other issues,  the patient was asked not to drive for the next 2 days, not to engage in      any  heavy lifting for the next 2 weeks.  Patient may shower.  She is to      call [phone] if she experiences pain or swelling at the cath site.  For      pain control, Tylenol 325 mg 1-2 tabs every 4-6 hours.  She will see Dr.      Graciela Husbands in followup      at [facility], [address], Wednesday, July 20, 2005, at 11:30 in the morning, and she may return to Dr. Dale Assumption      for Coumadin therapy and also for assessment of ophthalmologic      difficulties.      Maple Mirza, P.A.    ______________________________  Duke Salvia, M.D.    GM/MEDQ  D:  06/14/2005  T:  06/14/2005  Job:  [id number]   cc:   Duke Salvia, M.D.  [address]  [address]  [place]  [place] [address]   Darlin Priestly. Lady Gary, M.D.  [address]  [place], [place] [address]   Dale Canadian  [address]  [place]  [place] [address]  Fax: [phone]

## 2011-03-18 NOTE — Discharge Summary (Signed)
[name], [name]                 ACCOUNT NO.:  [id number]   MEDICAL RECORD NO.:  [id number]          PATIENT TYPE:  OIB   LOCATION:  3707                         FACILITY:  [facility]   PHYSICIAN:  Olga Millers, M.D. LHCDATE OF BIRTH:  25-May-1928   DATE OF ADMISSION:  02/18/2005  DATE OF DISCHARGE:  02/19/2005                           DISCHARGE SUMMARY - REFERRING   HISTORY:  [name] is a 77 year old female with a history of tachy-brady  syndrome.  She has history of atrial flutter and also atrial fibrillation.  She has atrial flutter associated with severe dizziness and fatigue.  She  was admitted for an EP study and radiofrequency ablation.  Patient was seen  approximately two weeks ago and placed on amiodarone and beta-blocker.  She  was then seen by Dr. Juanda Chance on February 17, 2005, with complaints of fatigue,  shortness of breath and dizziness and was found to be in atrial flutter with  rapid ventricular response.  She was subsequently admitted for ablation.   HOSPITAL COURSE:  [name], upon admission, had a TEE to rule out left  atrial appendage thrombus prior to her atrial flutter ablation.  This was  performed by Dr. Jens Som on February 18, 2005.  She had no thrombus in her  left atrial appendage.  Dr. Ladona Ridgel performed atrial flutter ablation on  February 18, 2005.  She was seen this morning by myself and Dr. Jens Som.  She  was maintaining sinus rhythm.  Patient also with  history of atrial  fibrillation.  Will continue her on her Coumadin and rate control  medications.  Patient was complaining of cough x1 week with no fever or  chills.  She has been afebrile in the hospital.  She had a mildly elevated  white blood cell count yesterday, therefore repeat CBC and chest x-ray were  done today.  She still had mildly elevated white blood cell count at 14.7,  but remained afebrile; therefore this was discussed with Dr. Graciela Husbands.  He felt  that patient did not need antibiotic therapy, was  to continue her incentive  spirometer and call if her symptoms worsened.  [name] also had some  anemia on her initial CBC. She states she has a history of this.  Her H&H  remained stable here.  Last check was hemoglobin 10.2, hematocrit 28.6 with  normal MCV and MCHC.  She is instructed to follow up with her primary care  physician regarding this.  Basic metabolic panel yesterday revealed mildly  elevated potassium, recheck today, potassium was within normal limits.   INR today was 2.3.  She did not receive any Coumadin yesterday or today.  She received 3 mg the day prior to her admission and normally receives 2 mg  alternating with 1 mg __________ dose as prior to admission.   DISCHARGE INSTRUCTIONS:  1.  Medications:      1.  Amiodarone 200 mg b.i.d.      2.  Metoprolol 25 mg b.i.d.      3.  Premarin 0.625 mg daily.      4.  Allopurinol 100 mg  daily.      5.  Amitriptyline 100 mg daily.      6.  Coumadin 2 mg alternating with 1 mg daily.  2.  Follow-up appointments.      1.  Patient is to follow up with [facility] on Thursday or Friday.          She is to call them for an appointment.  She has her Coumadin          checked at the [facility] in [place], [place].      2.  She is to follow up with Dr. Dale Whiteville, in the [facility] in [place], [place].  She is to call for an          appointment with her to evaluate her anemia.      3.  She will return to see Dr. Ladona Ridgel in two weeks.  Our office will          call to make that appointment.  3.  Activity:  She is instructed no driving, no heavy lifting, no sexual      activity x1 week.  4.  She should clean her cath site with soap and water.  No soaking in tub      x3 days.  5.  She is to call [phone] for any problems.   DISCHARGE DIAGNOSES:  1.  Tachy-brady syndrome, atrial flutter and atrial fibrillation.  She is      status post atrial flutter ablation and status post  pacemaker.  2.  Anemia.  3.  Gastroesophageal reflux disease.  4.  Non-Hodgkin's lymphoma.  5.  Gout.  6.  Nonobstructive coronary artery disease.      AB/MEDQ  D:  02/19/2005  T:  02/20/2005  Job:  [id number]   cc:   Dale   [address]  [place]  [place] [address]  Fax: [phone]   [facility]  [place], [place]

## 2011-03-18 NOTE — Assessment & Plan Note (Signed)
[facility]                                 ON-CALL NOTE   ADELHEID, HOGGARD                          MRN:          [id number]  DATE:02/18/2007                            DOB:          [birth year]    ON CALL TELEPHONE NOTE   HOME PHONE NUMBER:  [phone]   PRIMARY CARE PHYSICIAN  Dr. Sherryl Manges.   This lady called this morning at 7:41 a.m. stating that she was recently  hospitalized and placed Tikosyn therapy and underwent subsequent  radiofrequency ablation.  She was discharged on Tuesday.  Last night she  noted some numbness in her left foot and then her entire left side as  well as her left tongue with some numbness on the right.  She denied any  weakness or visual disturbance. She feels better this morning. She is  asking what she should do.  I advised her she should come in to the  emergency room for neurologic evaluation and probable head CT per the ER  doctors to rule out  TIA versus CVA.  She said she would do that.     Nicolasa Ducking, ANP  Electronically Signed    CB/MedQ  DD: 02/18/2007  DT: 02/18/2007  Job #: [id number]   cc:   Duke Salvia, MD, [facility]

## 2011-03-18 NOTE — Op Note (Signed)
NAMEJULIANAH, MARCIEL                 ACCOUNT NO.:  [id number]   MEDICAL RECORD NO.:  [id number]          PATIENT TYPE:  INP   LOCATION:  4715                         FACILITY:  [facility]   PHYSICIAN:  Duke Salvia, M.D.  DATE OF BIRTH:  [birth year]   DATE OF PROCEDURE:  01/18/2005  DATE OF DISCHARGE:                                 OPERATIVE REPORT   PREOPERATIVE DIAGNOSIS:  Sinus node dysfunction with previously implanted  pacemaker now under advisory recall.   POSTOPERATIVE DIAGNOSIS:  Sinus node dysfunction with previously implanted  pacemaker now under advisory recall.   PROCEDURE:  Explantation of a previously implanted device, and implantation  of a new device.   SURGEON:  Duke Salvia, M.D.   DESCRIPTION OF PROCEDURE:  After obtaining informed consent the patient was  brought to electrophysiology laboratory and placed on the fluoroscopic table  in supine position. After routine prep and drape, lidocaine was infiltrated  along the line of the previous incision and carried down to the level of the  pacemaker pocket using electrocautery and sharp dissection.  The device had  rotated 90 degrees so that the header was directed a cephalad-caudal  orientation.  The leaves were freed up with great care and the device was  then able to be explanted.  The previously implanted pace setter, 1488-KC  atrial leads number [phone] demonstrated a P wave of 2.9 with impedance of  377 and a threshold of 0.5 at 0.5 with the current threshold of 1.7 MA.   The previously implanted pacesetter 1488 TC lead with serial number [id number]  and the R was 7.8 with impedance of 543 with a threshold of 0.6 held at 0.5  milliseconds.  The current threshold was 1.3 __________.  With these  acceptable parameters the __________ leads were then attached to a [employer]onic  N-rhythm P-1501, DR pulse generator serial number [id number].  Atrial PC  was identified.  The puck was closely irrigated with antibiotic  containing  saline solution.  Hemostasis was assured in the lead that was in the pulse  generator and then placed in the pocket and secured to the prepectoral  fascial to prevent rotation.  The wound was closed in 3 layers in a normal  fashion. The wound was washed, dried, and a Benzoin and Steri-Strip dressing  was applied. Needle counts, sponge counts, and instrument counts were  correct at the end of the procedure according to the staff.   The patient tolerated the procedure without apparent complications.      SCK/MEDQ  D:  01/18/2005  T:  01/18/2005  Job:  [id number]   cc:   [facility] Dr. Mariel Kansky   Electrophysiology Laboratory   [facility]

## 2011-03-18 NOTE — Discharge Summary (Signed)
[name], [name]                 ACCOUNT NO.:  [id number]   MEDICAL RECORD NO.:  [id number]          PATIENT TYPE:  INP   LOCATION:  4715                         FACILITY:  [facility]   PHYSICIAN:  Duke Salvia, M.D.  DATE OF BIRTH:  December 22, 1927   DATE OF ADMISSION:  01/14/2005  DATE OF DISCHARGE:  01/18/2005                                 DISCHARGE SUMMARY   DISCHARGE DIAGNOSES:  1.  Admitted with break through atrial fibrillation/atrial flutter failing      sotalol therapy.  2.  Symptomatic tachyarrhythmia's: Dizziness, dyspnea, fatigue, paroxysmal      nocturnal dyspnea.  3.  Myocardial infarction ruled out by cardiac enzymes this admission.  4.  Subtherapeutic INR on admission (INR 1.6 January 14, 2005), now      therapeutic X72 hours.   SECONDARY DIAGNOSES:  1.  History of atrial fibrillation greater than 10 years with recent trials      of both sotalol and propafenone, both unsuccessful with break through      within 24 hours.  2.  Gastroesophageal reflux disease.  3.  History of non-Hodgkin's lymphoma.  4.  Gout.  5.  Ejection fraction preserved at 55 to 60% at stress test 2001.   PROCEDURE:  No procedures this admission.  Patient was started on amiodarone  36 hours prior to discharge and has converted to sinus rhythm.  Patient at  time of discharge is atrial pacing at rate of 60.   DISPOSITION:  At the time of discharge the patient is having no trouble  breathing; she is achieving 97% oxygen saturation on room air.  She does not  feel palpitations, anxiety, complaints of mild dizziness on the morning of  discharge but no fatigue.  Her beta blocker will be back titrated from 25 mg  four times daily to 25 mg twice daily.  This patient is discharged on the  following medications;   DISCHARGE MEDICATIONS:  1.  New medication, amiodarone 400 mg twice daily for two weeks then 400 mg      once daily for four weeks and then dosing by Dr. Lady Gary.  2.  New medication, metoprolol  50 mg one-half tablet in the morning, one-      half tablet in the evening.  3.  Coumadin 2 mg daily continuing on her home dose but with amiodarone her      INR will be higher.  4.  Premarin 0.625 mg daily.  5.  Allopurinol 100 mg daily.  6.  Elavil 10 mg daily.   PAIN MANAGEMENT:  Not applicable.   ACTIVITY:  As tolerated.   DIET:  Discharge diet will be low sodium, low cholesterol diet.   WOUND CARE:  Not applicable.   FOLLOW UP:  She has follow up appointment with Dr. Lady Gary.  She is to call his  office on Wednesday January 19, 2005 to arrange a follow up.  Dr. Macon Snyder has  been contacted. His office will call to arrange an office visit.  There is a  possibility that she may need atrial flutter ablation in the future.  Dr.  Dale Edgemont Snyder manages this lady's Coumadin.  The patient will call Dr.  Roby Snyder office on Wednesday, January 19, 2005 to arrange a visit for Pro Time,  INR blood work to be done on January 20, 2005.   BRIEF HISTORY OF PRESENT ILLNESS:  [name] is a 77 year old female  recently discharged from [facility] where she  underwent Rythmol loading.  She has symptomatic atrial fibrillation.  She  left the hospital in normal sinus rhythm but lapsed back into atrial  fibrillation within 24 hours.  She becomes symptomatic with her atrial  fibrillation at rapid rates, she becomes short of breath, weak, dizzy, she  feels a smothering feeling at night and has to sleep upright in the  recliner.  She has chest pain with heaviness and is unable to take a deep  breath.  Currently on admission at [facility] on January 14, 2005 her heart rate was at 140.  She did have complaint of chest tightness,  no diaphoresis, no nausea, vomiting but dizziness.  She had been on Rhythmol  approximately 1.5 weeks at the time of this admission.  Electrocardiogram in  the emergency room shows 2:1 atrial flutter and the patient does feel  palpitations.  She has  felt this since yesterday, January 13, 2005.  She has  also felt fatigue and dizziness.  She will be admitted.  She will be  continued on her Rythmol, 150 mg every 8 hours and she will also have rate  control with Cardizem intravenous drip at 10 mg per hour.   HOSPITAL COURSE:  Ms. Kapler was admitted to [facility]  with tachyarrhythmia's notably atrial flutter with 2:1 conduction with heart  rates in the 140's and her symptomatic as described in the history and  physical.  She was started on intravenous Cardizem and maintained on  propafenone.  Rates did moderate over the next 48 hours but she still had  episodic break through of atrial fibrillation/flutter.  Dr. Graciela Husbands was  consulted for possibility of atrial flutter ablation.  He saw the patient on  January 17, 2005.  The patient had been AV pacing throughout the afternoon of  January 16, 2005 and then at 8:30 in the evening broke into atrial flutter  with heart rate in the one-teen's which continued  through the morning hours  of January 17, 2005.  Dr. Graciela Husbands placed the patient on amiodarone 400 mg every  8 hours with subsequent conversion to sinus rhythm and the patient has  maintained sinus rhythm with A pacing at a heart rate of 60 for the next 36  hours prior to her discharge.  The patient's anxiety, dizziness, fatigue,  and chest tightness had all abated by the time of discharge.  The patient  goes home with her home dose of Coumadin which should help elevate the INR  into a therapeutic value with targets between 2 and 3.  She will be given a  prescription for amiodarone to cover the weaning period which is 400 mg  twice a day for two weeks and 400 mg daily for four weeks, thereafter to be  dosed by Dr. Lady Gary.   LABORATORY DATA:  On January 14, 2005 Pro Time was 17.1, INR 1.6.  on January 20, 2005 Pro Time 21, INR 2.3. Patient is going home on 2 mg daily. Complete blood count on January 16, 2005 with white blood cell count 10,   hemoglobin 11.6, hematocrit 33.7,  platelet count 244,000.  Fasting lipid  profile was taken this hospitalization and cholesterol was 148,  triglycerides elevated 433, HDL cholesterol 27, LDL cholesterol could not be  computed secondary to elevated triglycerides.  BNP on admission January 15, 2005 was 30.  She was ruled out for myocardial  infarction, serial troponin-I studies are less than 0.01, less than 0.01,  less than 0.01 in serial fashion.  Thyroid stimulating hormone this  admission was 3.215.  Hemoglobin A1C was 5.1.  Serum electrolytes on January 17, 2005 sodium 138, potassium 4.4, chloride 105, carbonate 27, glucose 126,  BUN 11, creatinine 0.9.      GM/MEDQ  D:  01/18/2005  T:  01/18/2005  Job:  [id number]   cc:   Dr. Lady Gary, [facility], [facility]   [place]  [address]  [place]  [place] [address]  Fax: [phone]   Fax #[phone] Dr. Radene Ou

## 2011-03-18 NOTE — Op Note (Signed)
[name], [name]                 ACCOUNT NO.:  [id number]   MEDICAL RECORD NO.:  [id number]          PATIENT TYPE:  OIB   LOCATION:  6533                         FACILITY:  [facility]   PHYSICIAN:  Duke Salvia, M.D.  DATE OF BIRTH:  Feb 12, 1928   DATE OF PROCEDURE:  06/13/2005  DATE OF DISCHARGE:  06/14/2005                                 OPERATIVE REPORT   PREOPERATIVE DIAGNOSIS:  Atrial flutter with prior ablation and clinical  recurrence.   POSTOPERATIVE DIAGNOSIS:  Atrial flutter with prior ablation and clinical  recurrence.   PROCEDURE:  Invasive electrophysiology study with electrogram, electrical  anatomical mapping, radiofrequency catheter ablation, device interrogation  and reprogramming.   Following obtaining informed consent, the patient was brought to the  electrophysiology laboratory and placed on the fluoroscopic table in the  supine position.  After routine prep and drape, cardiac catheterization was  performed with local anesthesia and conscious sedation.  Noninvasive blood  pressure monitoring and transcutaneous oxygen saturation monitoring and end  tidal CO2 monitoring were performed continuously throughout the procedure.  Following the procedure, the catheters were removed, hemostasis was  obtained, and the patient was transferred to the floor in stable condition.   CATHETERS:  6 French octapolar catheter was inserted via the right femoral  vein to the coronary sinus.  7 [place] decapolar catheter was inserted via the left femoral vein to the  tricuspid annulus.  9 French ESI mapping array was inserted via the left femoral vein to the  right atrium.  8 French 8 mm deflectable tip ablation catheter replaced by a 7 French 5 mm  deflectable ablation catheter was inserted via the right femoral vein to  mapping sites in the posterior septal space.   Service leads 1, AVF, and V1 were monitored continuously throughout the  procedure.  Following insertion of the  catheters, the stimulation protocol  included incremental atrial pacing via the coronary sinus with burst pacing  and mapping undertaken from the coronary sinus as well as the right atrium.   RESULTS:  Service electrocardiogram and basic intervals:   Initial:  Rhythm:  Atrial flutter; RR interval 597 milliseconds; PR interval N/A; QRS  duration 85 milliseconds; QT interval N/M; P wave duration N/A; AH N/M; HV  N/M.  The patient's rhythm was paced.   Final:  Rhythm is sinus; RR interval 1100 milliseconds; PR interval 139  milliseconds; QRS duration 82 milliseconds; QT interval 378 milliseconds; P  wave duration 112 milliseconds; AH interval N/M; HV N/M.   AV Wenckebach was 400 milliseconds, VA conduction was not assessed.   The patient presented to the lab in atrial flutter.  During catheter  manipulation sinus rhythm was induced.  Atrial flutter was reinduced with  coronary sinus pacing at 230 milliseconds, atrial flutter cycle length was  300 milliseconds.   Electrical anatomical mapping demonstrated, with some difficulty but was  supported by electrogram, mapping counter clockwise rotation around the  tricuspid annulus.  Using this, RF catheter ablation was directed based on  the electrical anatomical map.  For a transient period, we were able to  generate bidirectional block.  Conduction recurred and further mapping was  undertaken.  It took [place] a long time to find a diverticulum in the near vena  caval portion of the cavotricuspid isthmus where very large atrial  electrograms were seen and where ablation resulted in bidirectional block  after just a few seconds of RF delivery.  RF was delivered at this site for  two minutes, no evidence of recurrent isthmus conduction.   Radio frequency energy:  A total of 31 minutes and 29 seconds of RF was  delivered.   Fluoroscopy time:  A total of 14 minutes and 22 seconds was utilized at 7  1/2 frames per second.   IMPRESSION:  1.   Sinus bradycardia.  2.  Abnormal atrium with sustained atrial flutter.  Recurrent atrial flutter      ablation has been undertaken with electrical anatomical mapping.  On      this occasion, a diverticulum was seen at the caval portion of the      cavotricuspid isthmus which was presumably the reason for the recurrence      as this was at least missed by me at the last procedure as an anatomical      entity and  not expected as we were able to induce cavotricuspid isthmus      block across the isthmus without apparent ablation within the      diverticulum that I can recall.  3.  Normal AV nodal function.  4.  Normal HIS system function.  5.  No accessory pathway.  6.  Normal ventricular response to programmed stimulation as described.   The patient's device was reprogrammed to the DDD mode.  The patient  underwent successful repeat ablation of cavotricuspid isthmus flutter with  the key being identification of a diverticulum at the low cavoatrial  junction wherein conduction was sustained.           ______________________________  Duke Salvia, M.D.     SCK/MEDQ  D:  06/17/2005  T:  06/17/2005  Job:  [id number]   cc:   Dale Dougherty  [address]  [place]  [place] [address]  Fax: [phone]

## 2011-03-18 NOTE — H&P (Signed)
NAMEANJU, SERENO                 ACCOUNT NO.:  [id number]   MEDICAL RECORD NO.:  [id number]          PATIENT TYPE:  OIB   LOCATION:  2899                         FACILITY:  [facility]   PHYSICIAN:  Duke Salvia, M.D.  DATE OF BIRTH:  [birth year]   DATE OF ADMISSION:  06/13/2005  DATE OF DISCHARGE:                                HISTORY & PHYSICAL   PRIMARY CARE GIVER:  Dr. Jerrilyn Cairo.   ELECTROPHYSIOLOGIST:  Dr. Sherryl Manges.   INTOLERANT OF SOTALOL.   HISTORY OF PRESENT ILLNESS:  Ms. 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  She had the  generator changed to a Medtronic EnRhythm P-1501 on January 18, 2005 by Dr.  Sharrell Ku. She subsequently developed dyspnea, fatigue and dizziness  secondary to atrial flutter.  She underwent electrophysiology  studies/radiofrequency catheter ablation by Dr. Ladona Ridgel February 18, 2005.  She  had a symptomatic recurrence.  She underwent electrophysiology  study/radiofrequency catheter ablation by Dr. Sherryl Manges May 09, 2005.  She has a history of atrial fibrillation persisting for greater than 10  years and is on chronic Coumadin.  She has been intolerant of Sotalol and  amiodarone, has not converted to sinus rhythm.  Currently, she has only  dyspnea on exertion, however.  Interrogation of her pacemaker at an office  visit April 11, 2005 demonstrates dysrhythmia about 45% of the total time.  The patient presents for a repeat radiofrequency catheter ablation and  possible AV node ablation with left ventricular lead placement.  The patient  gives no history of diabetes, myocardial infarction, cerebrovascular  accident, deep venous thrombosis or pulmonary embolism or gastrointestinal  bleeding.  The patient has cardiac risk factors which include hypertension  and dyslipidemia.   MEDICATIONS:  1.  Premarin 0.625 mg  daily.  2.  Allopurinol 100 mg daily.  3.  Metoprolol 25 mg twice daily.  4.  Coumadin as directed by pharmacy.  5.  Elavil 10 mg daily.  6.  Amiodarone 200 mg two tablets daily.   The patient lives in [place] with her husband.  She is a retired  Scientist, physiological.  Does not have intake of particular alcoholic beverages or  tobacco products or recreational drugs.   FAMILY HISTORY:  Mother had diabetes however, died of cancer.  Father died  of kidney problems.  She had one sister who is decreased.  She was a  diabetic, had a prior silent myocardial infarction but died of complications  of her diabetes.   REVIEW OF SYSTEMS:  The patient does not complain of fevers, chills, night  sweats, weight change or adenopathy.  HEENT:  No nasal discharge, no  epistaxis, no hoarseness, vertigo or photophobia.  INTEGUMENT:  No rashes.  No known healing ulcerations.  CARDIOPULMONARY:  The patient does not have  any chest pain.  She does have dyspnea on exertion doing household chores.  No orthopnea.  No paroxysmal nocturnal dyspnea.  No lower extremity edema.  She has no history  of syncope or presyncope.  No claudications.  She does  have palpitations which she describes as a fluttering feeling.  UROGENITAL:  No frequency or urgency.  Nocturia but mild.  NEUROPSYCHIATRIC:  No  weakness, numbness, depression, anxiety.  GASTROINTESTINAL:  Gastroesophageal reflux disease but this is not chronic at the present time.  No history of GI bleeding.  ENDOCRINE:  No history of diabetes or thyroid  disease.  MUSCULOSKELETAL:  No arthralgias, joint swelling, deformity or  pain.  All other systems are negative.   PHYSICAL EXAMINATION:  VITAL SIGNS:  Temperature 97.3, pulse is 103 and is  regular.  Blood pressure 126/62, respirations 18, oxygen saturation 95% on  room air.  GENERAL:  Generally speaking, she is alert and oriented x3 in no acute  distress on presentation.  HEENT:  Normocephalic, atraumatic.  Eyes:   Pupils equal, round, reactive to  light.  Extraocular movements are intact.  Sclerae are anicteric.  Nares  without discharge.  NECK:  Supple.  No carotid bruits auscultated.  No thyromegaly.  No  jugulovenous distention.  No cervical lymphadenopathy.  HEART:  A regular rate and rhythm.  S1, S2 auscultated.  No S3, S4.  No  murmur.  The heart rate is rapid.  LUNGS: Clear to auscultation and percussion bilaterally.  ABDOMEN:  Soft, nondistended.  Bowel sounds are present.  No rebound or  guarding.  No hepatosplenomegaly.  The abdominal aorta is non pulsatile and  non expansile.  EXTREMITIES:  Show no evidence of cyanosis, clubbing or edema.  There are no  rashes or nonhealing ulcerations.  MUSCULOSKELETAL:  No joint deformity, effusions, kyphosis.  NEUROLOGICAL:  Alert and oriented x3.  Cranial nerves II-XII grossly intact.  Grip strength 5/5 bilaterally.  Gait is regular.   Electrocardiogram on May 11, 2005 shows an atrial flutter pattern.   LABORATORIES:  PT is 24, INR 2.1, PTT is 33. Serum electrolytes on June 07, 2005:  Sodium 137, potassium 4, chloride 104, bicarbonate 26, BUN is 14,  creatinine 1, glucose 114.  A TSH in June of 2006 was 2.43.   IMPRESSION:  1.  Recurrent atrial flutter which is symptomatic.      1.  Status post radiofrequency catheter ablation February 18, 2005.      2.  Status post redo radiofrequency catheter ablation May 09, 2005.  2.  Permanent atrial fibrillation on Coumadin therapy, intolerant of Sotalol      and no conversion with amiodarone.  3.  Status post permanent pacemaker placement with generator change for      recall on January 18, 2005, Medtronic EnRhythm implanted for tachybrady      syndrome.  4.  Low grade non-Hodgkin's lymphoma.  5.  Gastroesophageal reflux disease.  6.  Gout.  7.  Hypertension.  8.  Dyslipidemia.  9.  Hemochromatosis.  10. Irritable bowel syndrome.  PLAN:  Repeat electrophysiology study, radiofrequency catheter  ablation,  possible AV node ablation with placement of left ventricular lead by Dr.  Sherryl Manges.      Debbora Lacrosse   GM/MEDQ  D:  06/13/2005  T:  06/13/2005  Job:  [id number]

## 2011-03-18 NOTE — H&P (Signed)
NAMEMETA, KROENKE                 ACCOUNT NO.:  [id number]   MEDICAL RECORD NO.:  [id number]          PATIENT TYPE:  INP   LOCATION:  4715                         FACILITY:  [facility]   PHYSICIAN:  Charlies Constable, M.D. [facility] DATE OF BIRTH:  April 22, 1928   DATE OF ADMISSION:  01/14/2005  DATE OF DISCHARGE:  01/18/2005                                HISTORY & PHYSICAL   CLINICAL HISTORY:  Ms. Grealish is 77 years old and has a history of sick sinus  syndrome with paroxysmal atrial fibrillation. She had a pacemaker placed a  number of years ago and one month ago was hospitalized for a generator  replacement. She had a new Medtronic end-rhythm P-1501 DDR pacer implanted.  She had been previously refractory to sotalol and she was discharged on  amiodarone recently. She says that since she was discharged from the  hospital she has had a persistent feeling of fast heart rate. She called in  today complaining of somewhat worsening of her fast heart rate and we  brought her in for evaluation. She has had no associated chest pain or  palpitations.   PAST MEDICAL HISTORY:  Significant for GERD, history of non-Hodgkins'  lymphoma, gout, and nonobstructive coronary artery disease at  catheterization.   CURRENT MEDICATIONS:  1.  Amiodarone 200 mg daily.  2.  Toprol XL 25 mg b.i.d.  3.  Amitriptyline daily.  4.  Premarin 0.625 mg daily.  5.  Allopurinol 100 mg daily.  6.  Coumadin 1 mg alternating with 2 mg.   FAMILY/SOCIAL HISTORY:  Please see recent discharge summary.   PHYSICAL EXAMINATION:  VITAL SIGNS: On examination today, the blood pressure  is 120/82, pulse 120 and regular.  NECK: There is no venous distention. The carotids are full without bruits.  CHEST: Clear.  CARDIAC: Regular. I see no murmurs or gallops.  ABDOMEN: Soft with normal bowel sounds.  Peripheral pulses full. There is no  peripheral edema.  SKIN: Slight erythematous rash over the lower extremities and upper  extremities. This  was relatively recent.  MUSCULOSKELETAL: No deformities.  NEUROLOGIC: No focal neurologic signs.   An echocardiogram at the [facility] on December 21, 2004, showed normal  LV function, mild to moderate mitral regurgitation, and mild biatrial  enlargement.   Interrogation of pacemaker today showed she had atrial flutter with an  atrial rate of about 240 and a ventricular rate of about 120. We attempted  to pace her out of her atrial flutter, but this was unsuccessful.   IMPRESSION:  1.  Paroxysmal atrial flutter.  2.  History of paroxysmal atrial fibrillation.  3.  Status post Medtronic DDD pacemaker implantation with generator change      one month ago.  4.  Normal left ventricular function.  5.  Nonobstructive coronary artery disease.  6.  Non-Hodgkins' lymphoma.   RECOMMENDATIONS:  This lady's INR today was 1.8.  The options for treatment  include flutter ablation, AV nodal ablation, or possible referral for atrial  fibrillation ablation. I spoke with Dr. Ladona Ridgel today and I think the best  option would  be to go ahead and proceed with atrial flutter ablation with  the hopes that we can control her atrial fibrillation with medications. The  latter may be somewhat problematic because she does have a rash which could  possibly be related to amiodarone so we may need to find another drug to  control the atrial fibrillation. Will plan to give her extra Coumadin  tonight 3 mg since her INR was 1.8 today. Will plan to have her come in as  an a.m. tomorrow with plans for a TE guided flutter ablation tomorrow by Dr.  Ladona Ridgel.      BB/MEDQ  D:  02/17/2005  T:  02/17/2005  Job:  [id number]   cc:   Duke Salvia, M.D.   Doylene Canning. Ladona Ridgel, M.D.   Harold Hedge, M.D.  [place], [place]

## 2011-03-18 NOTE — Letter (Signed)
September 12, 2006    [name]  [address]  [place], [place] [address]   RE:  LAVELL, RIDINGS  MRN:  [id number]  /  DOB:  [birth year]   Dear Westley Hummer:   I hope this letter finds you well and I wish you and your family a Happy  Thanksgiving.   [name] comes in.  She continues to have problems with atrial fibrillation  that is rapid and quite discombobulating.   She continues to take amiodarone but notably she is having more atrial  fibrillation now.  We started Digitek last time, again she has more  symptomatic atrial fibrillation currently.   Her other medications include atenolol 50, amitriptyline and allopurinol.   I should also note that she has orthostatic lightheadedness which is  becoming increasingly problematic.   On examination, her blood pressure is 132/72 with a pulse of 59.  It is  stable for two minutes after standing, but at five minutes after standing it  falls to 118/62 with a pulse of 60.  LUNGS:  Clear.  HEART SOUNDS:  Regular.  EXTREMITIES:  Without edema.   Interrogation of her Medtronic EnRhythm pulse generator demonstrates a P  wave of 1.9 with an impedance of 316, a threshold of 1 volt at 0.4, the R  wave was 4.5 with an impedance of 544, a threshold of 1 volt at 0.4, battery  voltage was 3.02.   Interrogation of her device demonstrates also, Joyce, that she has atrial  fibrillation comprising about 15-18% of all of her heartbeats.  Her median  heart rate is over 105-110 b.p.m.   IMPRESSION:  1. Atrial fibrillation - Paroxysmal with rapid ventricular response and      quite symptomatic.  2. Amiodarone therapy for #1 - Inadequately effective.  3. Bradycardia status post pacemaker implantation.  4. Orthostatic lightheadedness with demonstrated hypotension.   Joyce, I think the options for Ms. Kocurek are becoming increasingly  limited.  As we try to up titrate her rate controlling agents, I wonder  whether we are going to get  into further problems with her baseline blood  pressure as well as further orthostasis.  I think it is unlikely that the  amiodarone is contributing to orthostasis, although there is some peripheral  neuropathy associated with it; because of its lack of effectiveness we will  plan to go ahead and discontinue it now anyway.  We will also discontinue  the Digitek, as it has been effective; that is to say, the heart rates have  been faster over the last 6 months then they were over the preceding 6  months.   We have discussed again the role of AV ablation and I suspect that this is  going to be coming more clear as the best of our options.  Ms. Furnas,  however, is somewhat reluctant to undergo a procedure that leaves her  irreversibly dependent upon her pacemaker; to that end, we will plan to  increase her atenolol as we discontinue her amiodarone and hope that she  does not end up with symptomatic hypotension and/or side effects from the  drug.  Will also hope that it is effective in controlling the rates.  I was  going to go ahead and give her some diltiazem at 120 mg a day, in addition,  if she needs it.   I have also mentioned to her that we will discontinue her amiodarone and her  Digitek.  I will plan to see her  again in 4 months' time at the new office  on [facility].  Please let me know if there is anything else I can  do.    Sincerely,      Duke Salvia, MD, [facility]  Electronically Signed    SCK/MedQ  DD: 09/12/2006  DT: 09/12/2006  Job #: [id number]

## 2011-03-23 ENCOUNTER — Telehealth: Payer: Self-pay

## 2011-03-23 NOTE — Telephone Encounter (Signed)
Patient pending dental extraction per dentist needs to hold coumadin x 2 days prior.  Please advise if okay to hold.

## 2011-03-24 NOTE — Telephone Encounter (Signed)
Ok to hold warfarin, restart after procedure

## 2011-03-29 NOTE — Telephone Encounter (Signed)
Patient had her tooth extraction today and wanted to be sure she was to start back on coumadin tonight.  Notified patient need to start back on coumadin tonight.

## 2011-04-01 ENCOUNTER — Ambulatory Visit: Payer: Self-pay | Admitting: Internal Medicine

## 2011-04-04 ENCOUNTER — Ambulatory Visit (INDEPENDENT_AMBULATORY_CARE_PROVIDER_SITE_OTHER): Payer: Medicare Other | Admitting: Internal Medicine

## 2011-04-04 ENCOUNTER — Other Ambulatory Visit (INDEPENDENT_AMBULATORY_CARE_PROVIDER_SITE_OTHER): Payer: Medicare Other | Admitting: *Deleted

## 2011-04-04 ENCOUNTER — Encounter: Payer: Self-pay | Admitting: Internal Medicine

## 2011-04-04 DIAGNOSIS — R0989 Other specified symptoms and signs involving the circulatory and respiratory systems: Secondary | ICD-10-CM

## 2011-04-04 DIAGNOSIS — Z950 Presence of cardiac pacemaker: Secondary | ICD-10-CM

## 2011-04-04 DIAGNOSIS — I4891 Unspecified atrial fibrillation: Secondary | ICD-10-CM

## 2011-04-04 DIAGNOSIS — G4730 Sleep apnea, unspecified: Secondary | ICD-10-CM | POA: Insufficient documentation

## 2011-04-04 DIAGNOSIS — I495 Sick sinus syndrome: Secondary | ICD-10-CM

## 2011-04-04 DIAGNOSIS — G478 Other sleep disorders: Secondary | ICD-10-CM

## 2011-04-04 HISTORY — DX: Presence of cardiac pacemaker: Z95.0

## 2011-04-04 NOTE — Assessment & Plan Note (Signed)
The patient has significant daytime fatigue as well as waking herself with snoring. It went away she has sleep apnea. Is not taking any medications to explain her fatigue. I will*discuss with her primary care physician Dr. Lorin Picket with her sleep study may be appropriate.

## 2011-04-04 NOTE — Progress Notes (Signed)
  HPI  [name] is a 77 y.o. female  Seen in followup for  a history of sick sinus syndrome and atrial fibrillation status post AV node ablation followed by pacemaker implantation. She had some intermittent postprocedural palpitations. These were associated with tracking and addressed by programming DDIR from [facility]. She also had her beta blockers adjusted. She denies SOB, CP or edema; her major complaint is fatigue. She has daytime somnolence. She occasionally awakens herself snoring at night. Past Medical History  Diagnosis Date  . Sick sinus syndrome     with atrial fibrillation  . Non Hodgkin's lymphoma     stable  . Coronary artery disease   . Hyperlipidemia   . Hypertension   . Hereditary hemochromatosis   . GERD (gastroesophageal reflux disease)     Past Surgical History  Procedure Date  . Cardiac catheterization   . Insert / replace / remove pacemaker   . Abdominal hysterectomy   . Cholecystectomy     Current Outpatient Prescriptions  Medication Sig Dispense Refill  . allopurinol (ZYLOPRIM) 300 MG tablet Take 100 mg by mouth daily.       [name] esomeprazole (NEXIUM) 40 MG capsule Take 40 mg by mouth daily before breakfast.        . folic acid (FOLVITE) 1 MG tablet Take 1 mg by mouth daily.        [name] gabapentin (NEURONTIN) 100 MG tablet Take 300 mg by mouth 3 (three) times daily.       [name] warfarin (COUMADIN) 2 MG tablet Take 1 tablet (2 mg total) by mouth as directed.  30 tablet  3  . DISCONTD: amLODipine (NORVASC) 5 MG tablet Take 5 mg by mouth daily.        [name] DISCONTD: metoprolol (TOPROL-XL) 50 MG 24 hr tablet Take 50 mg by mouth as directed. One and a half tablets by mouth daily.         Allergies  Allergen Reactions  . Levofloxacin     Review of Systems negative except from HPI and PMH  Physical Exam Well developed and well nourished in no acute distress HENT normal E scleral and icterus clear Neck Supple JVP flat; carotids brisk and full Clear to  ausculation Regular rate and rhythm, a 2/6 murmur is heard at the right sternal borderSoft with active bowel sounds No clubbing cyanosis and edema Alert and oriented, grossly normal motor and sensory function Skin Warm and Dry   Assessment and  Plan

## 2011-04-04 NOTE — Patient Instructions (Signed)
You are doing well. No medication changes were made. Please call us if you have new issues that need to be addressed before your next appt.  We will call you for a follow up Appt. With Dr. Graciela Husbands in 1 year.

## 2011-04-04 NOTE — Assessment & Plan Note (Signed)
Likely permanent. Dhe is on anticoagulation.

## 2011-04-04 NOTE — Assessment & Plan Note (Signed)
The patient's device was interrogated.  The information was reviewed. No changes were made in the programming.    

## 2011-04-07 ENCOUNTER — Inpatient Hospital Stay: Payer: Self-pay | Admitting: *Deleted

## 2011-04-13 ENCOUNTER — Ambulatory Visit (INDEPENDENT_AMBULATORY_CARE_PROVIDER_SITE_OTHER): Payer: Medicare Other | Admitting: Emergency Medicine

## 2011-04-13 DIAGNOSIS — I4891 Unspecified atrial fibrillation: Secondary | ICD-10-CM

## 2011-04-18 ENCOUNTER — Encounter: Payer: Self-pay | Admitting: Emergency Medicine

## 2011-04-20 ENCOUNTER — Ambulatory Visit (INDEPENDENT_AMBULATORY_CARE_PROVIDER_SITE_OTHER): Payer: Medicare Other | Admitting: Emergency Medicine

## 2011-04-20 DIAGNOSIS — E876 Hypokalemia: Secondary | ICD-10-CM

## 2011-04-20 DIAGNOSIS — I4891 Unspecified atrial fibrillation: Secondary | ICD-10-CM

## 2011-04-21 LAB — BASIC METABOLIC PANEL
BUN: 14 mg/dL (ref 6–23)
Calcium: 9.3 mg/dL (ref 8.4–10.5)
Creat: 0.78 mg/dL (ref 0.50–1.10)
Glucose, Bld: 178 mg/dL — ABNORMAL HIGH (ref 70–99)

## 2011-04-27 ENCOUNTER — Encounter: Payer: Self-pay | Admitting: Cardiovascular Disease

## 2011-04-27 ENCOUNTER — Encounter: Payer: Medicare Other | Admitting: Emergency Medicine

## 2011-04-27 ENCOUNTER — Ambulatory Visit (INDEPENDENT_AMBULATORY_CARE_PROVIDER_SITE_OTHER): Payer: Medicare Other | Admitting: Emergency Medicine

## 2011-04-27 DIAGNOSIS — I4891 Unspecified atrial fibrillation: Secondary | ICD-10-CM

## 2011-04-27 LAB — POCT INR: INR: 1.9

## 2011-05-01 ENCOUNTER — Ambulatory Visit: Payer: Self-pay | Admitting: Internal Medicine

## 2011-05-05 ENCOUNTER — Other Ambulatory Visit: Payer: Self-pay | Admitting: Internal Medicine

## 2011-05-11 ENCOUNTER — Ambulatory Visit (INDEPENDENT_AMBULATORY_CARE_PROVIDER_SITE_OTHER): Payer: Medicare Other | Admitting: Emergency Medicine

## 2011-05-11 DIAGNOSIS — I4891 Unspecified atrial fibrillation: Secondary | ICD-10-CM

## 2011-06-01 ENCOUNTER — Ambulatory Visit (INDEPENDENT_AMBULATORY_CARE_PROVIDER_SITE_OTHER): Payer: Medicare Other | Admitting: Emergency Medicine

## 2011-06-01 DIAGNOSIS — I4891 Unspecified atrial fibrillation: Secondary | ICD-10-CM

## 2011-06-14 ENCOUNTER — Telehealth: Payer: Self-pay | Admitting: Internal Medicine

## 2011-06-14 NOTE — Telephone Encounter (Signed)
Lm on [facility]'s VM

## 2011-06-14 NOTE — Telephone Encounter (Signed)
Per call from [facility], office wants to schedule MRI for pt and needs to know if pt pacemaker is the kind that will allow for her to do MRI scan. Please return call to advise/discuss.

## 2011-06-15 NOTE — Telephone Encounter (Signed)
Notified [facility] That pt does not have a MRI safe device.

## 2011-06-20 ENCOUNTER — Ambulatory Visit: Payer: Self-pay | Admitting: Internal Medicine

## 2011-06-21 LAB — AFP TUMOR MARKER: AFP-Tumor Marker: 3.9 ng/mL (ref 0.0–8.3)

## 2011-06-29 ENCOUNTER — Ambulatory Visit (INDEPENDENT_AMBULATORY_CARE_PROVIDER_SITE_OTHER): Payer: Medicare Other | Admitting: Emergency Medicine

## 2011-06-29 DIAGNOSIS — I4891 Unspecified atrial fibrillation: Secondary | ICD-10-CM

## 2011-07-02 ENCOUNTER — Ambulatory Visit: Payer: Self-pay | Admitting: Internal Medicine

## 2011-07-07 ENCOUNTER — Ambulatory Visit (INDEPENDENT_AMBULATORY_CARE_PROVIDER_SITE_OTHER): Payer: Medicare Other | Admitting: *Deleted

## 2011-07-07 DIAGNOSIS — I4891 Unspecified atrial fibrillation: Secondary | ICD-10-CM

## 2011-07-07 DIAGNOSIS — Z950 Presence of cardiac pacemaker: Secondary | ICD-10-CM

## 2011-07-07 DIAGNOSIS — I495 Sick sinus syndrome: Secondary | ICD-10-CM

## 2011-07-11 ENCOUNTER — Encounter: Payer: Self-pay | Admitting: Internal Medicine

## 2011-07-11 ENCOUNTER — Other Ambulatory Visit: Payer: Self-pay | Admitting: Internal Medicine

## 2011-07-12 LAB — REMOTE PACEMAKER DEVICE
RV LEAD IMPEDENCE PM: 568 Ohm
VENTRICULAR PACING PM: 99.98

## 2011-07-18 NOTE — Progress Notes (Signed)
Pacer remote check  

## 2011-07-22 LAB — AMYLASE, BODY FLUID: Amylase, Fluid: <5

## 2011-07-27 ENCOUNTER — Encounter: Payer: Medicare Other | Admitting: Emergency Medicine

## 2011-07-27 ENCOUNTER — Ambulatory Visit (INDEPENDENT_AMBULATORY_CARE_PROVIDER_SITE_OTHER): Payer: Medicare Other | Admitting: Emergency Medicine

## 2011-07-27 DIAGNOSIS — I4891 Unspecified atrial fibrillation: Secondary | ICD-10-CM

## 2011-07-27 LAB — POCT INR: INR: 2.2

## 2011-07-28 ENCOUNTER — Encounter: Payer: Self-pay | Admitting: *Deleted

## 2011-08-01 ENCOUNTER — Other Ambulatory Visit: Payer: Self-pay

## 2011-08-05 ENCOUNTER — Ambulatory Visit: Payer: Medicare Other | Admitting: Cardiovascular Disease

## 2011-08-10 ENCOUNTER — Ambulatory Visit: Payer: Medicare Other | Admitting: Cardiovascular Disease

## 2011-08-12 ENCOUNTER — Ambulatory Visit: Payer: Self-pay | Admitting: Unknown Physician Specialty

## 2011-08-12 HISTORY — PX: COLONOSCOPY: SHX174

## 2011-08-24 ENCOUNTER — Ambulatory Visit (INDEPENDENT_AMBULATORY_CARE_PROVIDER_SITE_OTHER): Payer: Medicare Other | Admitting: Emergency Medicine

## 2011-08-24 ENCOUNTER — Ambulatory Visit (INDEPENDENT_AMBULATORY_CARE_PROVIDER_SITE_OTHER): Payer: Medicare Other | Admitting: *Deleted

## 2011-08-24 ENCOUNTER — Encounter: Payer: Self-pay | Admitting: *Deleted

## 2011-08-24 DIAGNOSIS — R5383 Other fatigue: Secondary | ICD-10-CM

## 2011-08-24 DIAGNOSIS — Z7901 Long term (current) use of anticoagulants: Secondary | ICD-10-CM

## 2011-08-24 DIAGNOSIS — I4891 Unspecified atrial fibrillation: Secondary | ICD-10-CM

## 2011-08-24 DIAGNOSIS — R0602 Shortness of breath: Secondary | ICD-10-CM

## 2011-08-24 DIAGNOSIS — R42 Dizziness and giddiness: Secondary | ICD-10-CM

## 2011-08-24 LAB — POCT INR: INR: 2.2

## 2011-08-25 ENCOUNTER — Ambulatory Visit (INDEPENDENT_AMBULATORY_CARE_PROVIDER_SITE_OTHER): Payer: Medicare Other | Admitting: Internal Medicine

## 2011-08-25 ENCOUNTER — Encounter: Payer: Self-pay | Admitting: Internal Medicine

## 2011-08-25 DIAGNOSIS — R42 Dizziness and giddiness: Secondary | ICD-10-CM

## 2011-08-25 DIAGNOSIS — R5383 Other fatigue: Secondary | ICD-10-CM

## 2011-08-25 DIAGNOSIS — I951 Orthostatic hypotension: Secondary | ICD-10-CM

## 2011-08-25 DIAGNOSIS — I495 Sick sinus syndrome: Secondary | ICD-10-CM

## 2011-08-25 DIAGNOSIS — R5381 Other malaise: Secondary | ICD-10-CM

## 2011-08-25 DIAGNOSIS — E7800 Pure hypercholesterolemia, unspecified: Secondary | ICD-10-CM

## 2011-08-25 DIAGNOSIS — I4891 Unspecified atrial fibrillation: Secondary | ICD-10-CM

## 2011-08-25 DIAGNOSIS — Z950 Presence of cardiac pacemaker: Secondary | ICD-10-CM

## 2011-08-25 LAB — PACEMAKER DEVICE OBSERVATION
AL AMPLITUDE: 3.2505 mv
AL IMPEDENCE PM: 392 Ohm
BATTERY VOLTAGE: 2.92 V
BRDY-0004RV: 120 {beats}/min
RV LEAD THRESHOLD: 1 V
VENTRICULAR PACING PM: 99.98

## 2011-08-25 NOTE — Assessment & Plan Note (Signed)
The patient's device was interrogated.  The information was reviewed. No changes were made in the programming.    

## 2011-08-25 NOTE — Assessment & Plan Note (Signed)
Chronic on anticoagulation

## 2011-08-25 NOTE — Assessment & Plan Note (Signed)
Her dizziness seems to be aggravated by being upright. There is no objective evidence of orthostatic hypotension. Her ENT evaluation is negative. I wonder whether the possibility of having had some kind of small stroke. We have reviewed maneuvers that are anti-orthostatic i.e. Isometric contraction to see if that doesn't help with her symptoms. She is also just had therapy for her C. Difficile colitis and if her diarrhea abate may be   the dizziness will abate also

## 2011-08-25 NOTE — Patient Instructions (Signed)
Continue current medications.  Your physician recommends that you schedule a follow-up appointment in: 3 months with Dr. Mariah Milling   Follow up with Dr. Graciela Husbands as already scheduled in June of 2013.

## 2011-08-25 NOTE — Progress Notes (Signed)
  HPI  [name] is a 77 y.o. female Seen in followup for a history of sick sinus syndrome and atrial fibrillation status post AV node ablation followed by pacemaker implantation. She had some intermittent postprocedural palpitations. These were associated with tracking and addressed by programming DDIR from [facility]. She also had her beta blockers adjusted.  She was seen yesterday in the office with complaints of shortness of breath. She comes in today for further evaluation. Her other major complaint is dizziness. This is not evident lining is present sitting and worse standing. She has seen ENT who after extensive testing apparently said it was not her ears is not any better than it has been over the last couple of months.  She's also had chronic C. Difficile colitis. She was treated last week with a stool transplant and had her first normal stool today..   Past Medical History  Diagnosis Date  . Non Hodgkin's lymphoma     stable; followed by an oncologist  . Hyperlipidemia   . Hypertension   . Hereditary hemochromatosis     Phlebotomized in the past, not getting phlebotomy now  . GERD (gastroesophageal reflux disease)   . Sick sinus syndrome     with atrial fibrillation. Pt is s/p AV nodal ablation due to permanent and symptomatic atrial fibrillation.   . Coronary artery disease     Nonobstructive    Past Surgical History  Procedure Date  . Cardiac catheterization   . Insert / replace / remove pacemaker   . Abdominal hysterectomy   . Cholecystectomy   . Atrial ablation surgery 2008    AV nodal ablation  . US echocardiography 07/2008    EF 55%, mild LVH. No significant valvular abnormalities    Current Outpatient Prescriptions  Medication Sig Dispense Refill  . allopurinol (ZYLOPRIM) 300 MG tablet Take 100 mg by mouth daily as needed.       [name] amLODipine (NORVASC) 5 MG tablet Take 5 mg by mouth daily.        [name] esomeprazole (NEXIUM) 40 MG capsule Take 40 mg by mouth daily  before breakfast.        . folic acid (FOLVITE) 1 MG tablet Take 1 mg by mouth daily.        [name] gabapentin (NEURONTIN) 100 MG tablet Take 300 mg by mouth 3 (three) times daily.       [name] warfarin (COUMADIN) 2 MG tablet Take 1 tablet (2 mg total) by mouth as directed.  30 tablet  3  . warfarin (COUMADIN) 3 MG tablet Take 3 mg by mouth as directed.          Allergies  Allergen Reactions  . Levofloxacin     Review of Systems negative except from HPI and PMH  Physical Exam Well developed and well nourished in no acute distress HENT normal E scleral and icterus clear Neck Supple JVP flat; carotids brisk and full Clear to ausculation Regular rate and rhythm, no murmurs gallops or rub Soft with active bowel sounds No clubbing cyanosis and edema Alert and oriented, grossly normal motor and sensory function Skin Warm and Dry  ECG [name] yesterday demonstrates atrial fibrillation and ventricular pacing  Assessment and  Plan

## 2011-08-26 LAB — COMPREHENSIVE METABOLIC PANEL
ALT: 23 IU/L (ref 0–40)
AST: 27 IU/L (ref 0–40)
Alkaline Phosphatase: 104 IU/L (ref 25–165)
BUN/Creatinine Ratio: 18 (ref 11–26)
BUN: 13 mg/dL (ref 8–27)
CO2: 19 mmol/L — ABNORMAL LOW (ref 20–32)
Calcium: 9.2 mg/dL (ref 8.6–10.2)
Chloride: 100 mmol/L (ref 97–108)
Creatinine, Ser: 0.72 mg/dL (ref 0.57–1.00)
Globulin, Total: 2.5 g/dL (ref 1.5–4.5)
Sodium: 138 mmol/L (ref 134–144)

## 2011-08-26 LAB — LIPID PANEL
Cholesterol, Total: 196 mg/dL (ref 100–199)
HDL: 34 mg/dL — ABNORMAL LOW
LDL Calculated: 90 mg/dL (ref 0–99)
Triglycerides: 361 mg/dL — ABNORMAL HIGH (ref 0–149)
VLDL Cholesterol Cal: 72 mg/dL — ABNORMAL HIGH (ref 5–40)

## 2011-08-26 LAB — TSH: TSH: 2.67 u[IU]/mL (ref 0.450–4.500)

## 2011-09-05 ENCOUNTER — Other Ambulatory Visit: Payer: Self-pay | Admitting: Internal Medicine

## 2011-09-05 DIAGNOSIS — E878 Other disorders of electrolyte and fluid balance, not elsewhere classified: Secondary | ICD-10-CM

## 2011-09-08 ENCOUNTER — Ambulatory Visit: Payer: Self-pay | Admitting: Internal Medicine

## 2011-09-20 ENCOUNTER — Ambulatory Visit (INDEPENDENT_AMBULATORY_CARE_PROVIDER_SITE_OTHER): Payer: Medicare Other | Admitting: *Deleted

## 2011-09-20 DIAGNOSIS — E878 Other disorders of electrolyte and fluid balance, not elsewhere classified: Secondary | ICD-10-CM

## 2011-09-21 LAB — BASIC METABOLIC PANEL
BUN/Creatinine Ratio: 15 (ref 11–26)
Creatinine, Ser: 0.72 mg/dL (ref 0.57–1.00)
GFR calc Af Amer: 91 mL/min/{1.73_m2} (ref >59)
GFR calc non Af Amer: 79 mL/min/{1.73_m2} (ref >59)
Potassium: 4.1 mmol/L (ref 3.5–5.2)
Sodium: 136 mmol/L (ref 134–144)

## 2011-10-01 ENCOUNTER — Ambulatory Visit: Payer: Self-pay | Admitting: Internal Medicine

## 2011-10-05 ENCOUNTER — Ambulatory Visit (INDEPENDENT_AMBULATORY_CARE_PROVIDER_SITE_OTHER): Payer: Medicare Other | Admitting: Emergency Medicine

## 2011-10-05 DIAGNOSIS — I4891 Unspecified atrial fibrillation: Secondary | ICD-10-CM

## 2011-10-05 DIAGNOSIS — Z7901 Long term (current) use of anticoagulants: Secondary | ICD-10-CM

## 2011-10-13 ENCOUNTER — Encounter: Payer: Medicare Other | Admitting: *Deleted

## 2011-10-18 ENCOUNTER — Ambulatory Visit: Payer: Self-pay | Admitting: Internal Medicine

## 2011-11-16 ENCOUNTER — Ambulatory Visit (INDEPENDENT_AMBULATORY_CARE_PROVIDER_SITE_OTHER): Payer: Medicare Other | Admitting: Emergency Medicine

## 2011-11-16 DIAGNOSIS — I4891 Unspecified atrial fibrillation: Secondary | ICD-10-CM

## 2011-11-16 DIAGNOSIS — Z7901 Long term (current) use of anticoagulants: Secondary | ICD-10-CM

## 2011-11-24 ENCOUNTER — Encounter: Payer: Self-pay | Admitting: Internal Medicine

## 2011-11-24 ENCOUNTER — Ambulatory Visit (INDEPENDENT_AMBULATORY_CARE_PROVIDER_SITE_OTHER): Payer: Medicare Other | Admitting: *Deleted

## 2011-11-24 DIAGNOSIS — I495 Sick sinus syndrome: Secondary | ICD-10-CM

## 2011-11-24 DIAGNOSIS — I4891 Unspecified atrial fibrillation: Secondary | ICD-10-CM

## 2011-11-24 DIAGNOSIS — Z950 Presence of cardiac pacemaker: Secondary | ICD-10-CM

## 2011-11-25 ENCOUNTER — Ambulatory Visit: Payer: Medicare Other | Admitting: Cardiovascular Disease

## 2011-11-27 LAB — REMOTE PACEMAKER DEVICE
AL IMPEDENCE PM: 384 Ohm
ATRIAL PACING PM: 9.27

## 2011-11-29 ENCOUNTER — Encounter: Payer: Self-pay | Admitting: *Deleted

## 2011-11-29 NOTE — Progress Notes (Signed)
Remote pacer check  

## 2011-12-28 ENCOUNTER — Ambulatory Visit (INDEPENDENT_AMBULATORY_CARE_PROVIDER_SITE_OTHER): Payer: Medicare Other | Admitting: *Deleted

## 2011-12-28 DIAGNOSIS — I4891 Unspecified atrial fibrillation: Secondary | ICD-10-CM

## 2011-12-28 DIAGNOSIS — Z7901 Long term (current) use of anticoagulants: Secondary | ICD-10-CM

## 2012-02-08 ENCOUNTER — Ambulatory Visit (INDEPENDENT_AMBULATORY_CARE_PROVIDER_SITE_OTHER): Payer: Medicare Other

## 2012-02-08 DIAGNOSIS — Z7901 Long term (current) use of anticoagulants: Secondary | ICD-10-CM

## 2012-02-08 DIAGNOSIS — I4891 Unspecified atrial fibrillation: Secondary | ICD-10-CM

## 2012-02-08 LAB — POCT INR: INR: 2.6

## 2012-02-23 ENCOUNTER — Ambulatory Visit (INDEPENDENT_AMBULATORY_CARE_PROVIDER_SITE_OTHER): Payer: Medicare Other | Admitting: *Deleted

## 2012-02-23 DIAGNOSIS — I495 Sick sinus syndrome: Secondary | ICD-10-CM

## 2012-02-24 ENCOUNTER — Encounter: Payer: Self-pay | Admitting: Internal Medicine

## 2012-02-27 LAB — REMOTE PACEMAKER DEVICE: BATTERY VOLTAGE: 2.9 V

## 2012-02-28 NOTE — Progress Notes (Signed)
Remote pacer check  

## 2012-03-08 ENCOUNTER — Ambulatory Visit: Payer: Self-pay | Admitting: Internal Medicine

## 2012-03-08 LAB — CBC CANCER CENTER
Basophil %: 0.8 %
Eosinophil #: 0.2 x10 3/mm (ref 0.0–0.7)
Eosinophil %: 2.2 %
HGB: 15.7 g/dL (ref 12.0–16.0)
Lymphocyte #: 4.2 x10 3/mm — ABNORMAL HIGH (ref 1.0–3.6)
Monocyte #: 0.8 x10 3/mm (ref 0.2–0.9)
Monocyte %: 8 %
Neutrophil #: 5.1 x10 3/mm (ref 1.4–6.5)
Platelet: 244 x10 3/mm (ref 150–440)
WBC: 10.4 x10 3/mm (ref 3.6–11.0)

## 2012-03-08 LAB — FERRITIN: Ferritin (ARMC): 191 ng/mL (ref 8–388)

## 2012-03-08 LAB — CREATININE, SERUM
Creatinine: 0.86 mg/dL (ref 0.60–1.30)
EGFR (Non-African Amer.): >60

## 2012-03-19 ENCOUNTER — Encounter: Payer: Self-pay | Admitting: *Deleted

## 2012-03-21 ENCOUNTER — Ambulatory Visit (INDEPENDENT_AMBULATORY_CARE_PROVIDER_SITE_OTHER): Payer: Medicare Other

## 2012-03-21 ENCOUNTER — Ambulatory Visit: Payer: Self-pay | Admitting: Internal Medicine

## 2012-03-21 DIAGNOSIS — Z7901 Long term (current) use of anticoagulants: Secondary | ICD-10-CM

## 2012-03-21 DIAGNOSIS — I4891 Unspecified atrial fibrillation: Secondary | ICD-10-CM

## 2012-03-21 LAB — POCT INR: INR: 3.7

## 2012-03-31 ENCOUNTER — Ambulatory Visit: Payer: Self-pay | Admitting: Internal Medicine

## 2012-04-10 ENCOUNTER — Encounter: Payer: Self-pay | Admitting: Internal Medicine

## 2012-04-10 ENCOUNTER — Ambulatory Visit (INDEPENDENT_AMBULATORY_CARE_PROVIDER_SITE_OTHER): Payer: Medicare Other

## 2012-04-10 ENCOUNTER — Ambulatory Visit (INDEPENDENT_AMBULATORY_CARE_PROVIDER_SITE_OTHER): Payer: Medicare Other | Admitting: Internal Medicine

## 2012-04-10 VITALS — BP 112/60 | HR 64 | Ht 62.0 in | Wt 132.2 lb

## 2012-04-10 DIAGNOSIS — Z950 Presence of cardiac pacemaker: Secondary | ICD-10-CM

## 2012-04-10 DIAGNOSIS — Z7901 Long term (current) use of anticoagulants: Secondary | ICD-10-CM

## 2012-04-10 DIAGNOSIS — I4891 Unspecified atrial fibrillation: Secondary | ICD-10-CM

## 2012-04-10 LAB — POCT INR: INR: 2.6

## 2012-04-10 LAB — PACEMAKER DEVICE OBSERVATION
ATRIAL PACING PM: 7.49
BRDY-0002RV: 60 {beats}/min
RV LEAD IMPEDENCE PM: 568 Ohm
VENTRICULAR PACING PM: 99.98

## 2012-04-10 NOTE — Progress Notes (Signed)
  HPI  [name] is a 78 y.o. female Seen in followup for a history of sick sinus syndrome and atrial fibrillation status post AV node ablation followed by pacemaker implantation. She had some intermittent postprocedural palpitations. These were associated with tracking and addressed by programming DDIR from [facility]. She also had her beta blockers adjusted.  She is doinq quite well apart from a recent sinus infection       Past Medical History  Diagnosis Date  . Non Hodgkin's lymphoma     stable; followed by an oncologist  . Hyperlipidemia   . Hypertension   . Hereditary hemochromatosis     Phlebotomized in the past, not getting phlebotomy now  . GERD (gastroesophageal reflux disease)   . Complete heart block  s/p av ablation     with atrial fibrillation. Pt is s/p AV nodal ablation due to permanent and symptomatic atrial fibrillation.   . Coronary artery disease     Nonobstructive  . C. difficile colitis     feces transplant    Past Surgical History  Procedure Date  . Cardiac catheterization   . Insert / replace / remove pacemaker   . Abdominal hysterectomy   . Cholecystectomy   . Atrial ablation surgery 2008    AV nodal ablation  . US echocardiography 07/2008    EF 55%, mild LVH. No significant valvular abnormalities  . Colonoscopy oct.12, 2012  . Stool transplant     Current Outpatient Prescriptions  Medication Sig Dispense Refill  . ALPRAZolam (XANAX) 0.25 MG tablet Take 0.25 mg by mouth at bedtime as needed.      [name] amLODipine (NORVASC) 5 MG tablet Take 5 mg by mouth daily.        . Cetirizine HCl (ZYRTEC ALLERGY PO) Take by mouth. prn      . fluticasone (FLONASE) 50 MCG/ACT nasal spray Place 2 sprays into the nose daily.      . folic acid (FOLVITE) 1 MG tablet Take 1 mg by mouth daily.        [name] gabapentin (NEURONTIN) 400 MG capsule Take 400 mg by mouth daily.      . Pantoprazole Sodium (PROTONIX PO) Take 40 mg by mouth daily.      [name] warfarin (COUMADIN) 2 MG tablet  Take 1 tablet (2 mg total) by mouth as directed.  30 tablet  3  . warfarin (COUMADIN) 3 MG tablet Take 3 mg by mouth as directed.         Allergies  Allergen Reactions  . Amoxicillin   . Levofloxacin   . Plavix (Clopidogrel Bisulfate)     Review of Systems negative except from HPI and PMH  Physical Exam BP 112/60  Pulse 64  Ht 5\' 2"  (1.575 m)  Wt 132 lb 4 oz (59.988 kg)  BMI 24.19 kg/m2 Well developed and well nourished in no acute distress HENT normal E scleral and icterus clear Neck Supple JVP flat; carotids brisk and full Clear to ausculation regular rate and rhythm, no murmurs gallops or rub Soft with active bowel sounds No clubbing cyanosis none Edema Alert and oriented, grossly normal motor and sensory function Skin Warm and Dry    Assessment and  Plan

## 2012-04-10 NOTE — Assessment & Plan Note (Signed)
The patient's device was interrogated.  The information was reviewed. No changes were made in the programming.    

## 2012-04-10 NOTE — Assessment & Plan Note (Signed)
peramnaent on antioagulation   Discussed alternatives to coumadin and have her discuss it with her hematologist because of the uniqueness of the situation related to her hemachromatosis

## 2012-04-10 NOTE — Patient Instructions (Signed)
Continue to take 2 mg daily except 3 mg mondays and thursdays.  Return to clinic in 4 weeks.

## 2012-04-10 NOTE — Patient Instructions (Addendum)
Your physician wants you to follow-up in: 1 year with Dr. Graciela Husbands. Remote trransmission 07/16/12. You will receive a reminder letter in the mail two months in advance. If you don't receive a letter, please call our office to schedule the follow-up appointment.

## 2012-04-25 NOTE — Addendum Note (Signed)
Addended by: Antonieta Iba on: 04/25/2012 09:39 PM   Modules accepted: Level of Service

## 2012-05-09 ENCOUNTER — Ambulatory Visit (INDEPENDENT_AMBULATORY_CARE_PROVIDER_SITE_OTHER): Payer: Medicare Other

## 2012-05-09 ENCOUNTER — Ambulatory Visit (INDEPENDENT_AMBULATORY_CARE_PROVIDER_SITE_OTHER): Payer: Medicare Other | Admitting: Cardiovascular Disease

## 2012-05-09 ENCOUNTER — Encounter: Payer: Self-pay | Admitting: Internal Medicine

## 2012-05-09 ENCOUNTER — Encounter: Payer: Self-pay | Admitting: Cardiovascular Disease

## 2012-05-09 VITALS — BP 182/77 | HR 61 | Ht 62.0 in | Wt 132.0 lb

## 2012-05-09 DIAGNOSIS — I10 Essential (primary) hypertension: Secondary | ICD-10-CM | POA: Insufficient documentation

## 2012-05-09 DIAGNOSIS — I4891 Unspecified atrial fibrillation: Secondary | ICD-10-CM

## 2012-05-09 DIAGNOSIS — Z7901 Long term (current) use of anticoagulants: Secondary | ICD-10-CM

## 2012-05-09 DIAGNOSIS — R42 Dizziness and giddiness: Secondary | ICD-10-CM

## 2012-05-09 DIAGNOSIS — R079 Chest pain, unspecified: Secondary | ICD-10-CM

## 2012-05-09 NOTE — Progress Notes (Signed)
Patient ID: [name], female    DOB: 1928-01-12, 78 y.o.   MRN: [id number]  HPI Comments: Ms. Siefken has a history of sick sinus syndrome and atrial fibrillation status post AV node ablation followed by pacemaker implantation. She was last seen in the office of Dr. Graciela Husbands in June 2013. At that time she had complaints of shortness of breath. Also with symptoms of dizziness.  She does report a history of vertigo but the dizziness is not vertigo in her opinion. Notes indicate chronic C. difficile colitis.  She presented today for a Coumadin check and reported not feeling well. She has dizziness for several weeks, several days of heavy chest palpitations and some chest discomfort. No change in her dizziness with supine position or standing. She did take a Xanax this morning and a new motion sickness tablet for her dizziness. She has not taken this pill before and it was all she could find at [facility]. She does not check her blood pressure at home. Her blood pressure cuff does not work well.  Notes indicate she has permanent atrial fibrillation.  EKG shows paced rhythm with rate 61 beats per minute.    Outpatient Encounter Prescriptions as of 05/09/2012  Medication Sig Dispense Refill  . ALPRAZolam (XANAX) 0.25 MG tablet Take 0.25 mg by mouth at bedtime as needed.      [name] amLODipine (NORVASC) 5 MG tablet Take 5 mg by mouth daily.        . Cetirizine HCl (ZYRTEC ALLERGY PO) Take by mouth. prn      . fluticasone (FLONASE) 50 MCG/ACT nasal spray Place 2 sprays into the nose daily.      . folic acid (FOLVITE) 1 MG tablet Take 1 mg by mouth daily.        [name] gabapentin (NEURONTIN) 400 MG capsule Take 400 mg by mouth daily.      . Pantoprazole Sodium (PROTONIX PO) Take 40 mg by mouth daily.      [name] warfarin (COUMADIN) 2 MG tablet Take 1 tablet (2 mg total) by mouth as directed.  30 tablet  3  . warfarin (COUMADIN) 3 MG tablet Take 3 mg by mouth as directed.        Review of Systems  Constitutional: Positive  for fatigue.  HENT: Negative.   Eyes: Negative.   Respiratory: Negative.   Cardiovascular: Positive for palpitations.  Gastrointestinal: Negative.   Musculoskeletal: Negative.   Skin: Negative.   Neurological: Positive for weakness.  Hematological: Negative.   Psychiatric/Behavioral: Negative.   All other systems reviewed and are negative.    BP 182/77  Pulse 61  Ht 5\' 2"  (1.575 m)  Wt 132 lb (59.875 kg)  BMI 24.14 kg/m2  Physical Exam  Nursing note and vitals reviewed. Constitutional: She is oriented to person, place, and time. She appears well-developed and well-nourished.  HENT:  Head: Normocephalic.  Nose: Nose normal.  Mouth/Throat: Oropharynx is clear and moist.  Eyes: Conjunctivae are normal. Pupils are equal, round, and reactive to light.  Neck: Normal range of motion. Neck supple. No JVD present.  Cardiovascular: Normal rate, regular rhythm, S1 normal, S2 normal, normal heart sounds and intact distal pulses.  Exam reveals no gallop and no friction rub.   No murmur heard. Pulmonary/Chest: Effort normal and breath sounds normal. No respiratory distress. She has no wheezes. She has no rales. She exhibits no tenderness.  Abdominal: Soft. Bowel sounds are normal. She exhibits no distension. There is no tenderness.  Musculoskeletal: Normal range  of motion. She exhibits no edema and no tenderness.  Lymphadenopathy:    She has no cervical adenopathy.  Neurological: She is alert and oriented to person, place, and time. Coordination normal.  Skin: Skin is warm and dry. No rash noted. No erythema.  Psychiatric: She has a normal mood and affect. Her behavior is normal. Judgment and thought content normal.         Assessment and Plan

## 2012-05-09 NOTE — Assessment & Plan Note (Signed)
Blood pressure is very elevated today, confirmed by myself bilaterally. She did not take her medication and we have suggested she take this today with an additional dose later tonight if her blood pressure continues to be elevated, greater than 160 systolic. Uncertain if this is causing her general malaise and palpitations.

## 2012-05-09 NOTE — Patient Instructions (Addendum)
Blood pressure is elevated today Goal blood pressure is <140 on the top number Your bottom number is ok  Please take your amlodipine when you get home If the blood pressure  Stays elevated after a few hours, take another amlodipine  Please call us if you have new issues that need to be addressed before your next appt.  Your physician wants you to follow-up in: 1 week

## 2012-05-09 NOTE — Assessment & Plan Note (Signed)
Continued symptoms of orthostatic dizziness on today's visit. Blood pressure is elevated. She did not take her blood pressure medication today. No new testing at this time.

## 2012-05-09 NOTE — Assessment & Plan Note (Signed)
She appears to be sinus rhythm today, ventricular paced. There does not appear to be AV synchrony. We will discuss this with Dr. Graciela Husbands.

## 2012-05-18 ENCOUNTER — Ambulatory Visit (INDEPENDENT_AMBULATORY_CARE_PROVIDER_SITE_OTHER): Payer: Medicare Other

## 2012-05-18 VITALS — BP 122/60 | HR 63 | Ht 62.0 in | Wt 133.8 lb

## 2012-05-18 DIAGNOSIS — R42 Dizziness and giddiness: Secondary | ICD-10-CM

## 2012-05-18 DIAGNOSIS — R002 Palpitations: Secondary | ICD-10-CM

## 2012-05-18 NOTE — Progress Notes (Signed)
Pt here for f/u EKG/BP check after having c/o palpitations/dizziness 05/09/12.  Last EKG showed some AV dissociation, occasional V paced, occasional AV paced, not always tracking P wave.  Dr. Graciela Husbands reviewed and gave orders to bring her in for device check and to reprogram to DDDR with upper limit set at 110 BPM. Gunnar Fusi in [facility] office was made aware and scheduled pt for device check in [facility] 05/21/12.  Pt still c/o palpitations and dizziness.  She says she became very anxious last night. She lives alone and asked dtr to come stay with her for comfort. Dtr came and she finally fell asleep by 0200.   EKG performed today. Was unchanged from 7/10 EKG. Still shows AV dissociation, P waves not capturing.  I called Kristen in [place]. She is aware of plan to reprogram and asks that we reassure pt, once we change settings, pt should feel better.  BP under better control. I advised pt to continue current regimen and encouraged her to keep appt for device check on Monday 7/22.  Understanding verb.

## 2012-05-18 NOTE — Patient Instructions (Addendum)
Keep appt with device clinic Monday 7/22 at 1100.  Call us with any questions or concerns prior to visit.

## 2012-05-21 ENCOUNTER — Encounter: Payer: Self-pay | Admitting: Internal Medicine

## 2012-05-21 ENCOUNTER — Ambulatory Visit (INDEPENDENT_AMBULATORY_CARE_PROVIDER_SITE_OTHER): Payer: Medicare Other | Admitting: *Deleted

## 2012-05-21 DIAGNOSIS — I4891 Unspecified atrial fibrillation: Secondary | ICD-10-CM

## 2012-05-21 LAB — PACEMAKER DEVICE OBSERVATION
AL IMPEDENCE PM: 384 Ohm
ATRIAL PACING PM: 31.63
BAMS-0001: 170 {beats}/min
BATTERY VOLTAGE: 2.88 V
BRDY-0002RA: 60 {beats}/min
RV LEAD IMPEDENCE PM: 544 Ohm
VENTRICULAR PACING PM: 99.98

## 2012-05-21 NOTE — Progress Notes (Signed)
Per SK change mode to DDDR with MTR 110

## 2012-05-23 ENCOUNTER — Telehealth: Payer: Self-pay | Admitting: Physician Assistant

## 2012-05-23 NOTE — Telephone Encounter (Signed)
Patient called answering service regarding "heart pounding." Patient reports feeling better after having device reprogrammed to DDDI settings after follow-up with Dr. Mariah Milling earlier this month revealed incomplete P wave capture with some AV dissociation. During that visit she noted ongoing lightheadedness, dizziness and weakness on ambulating representative of orthostatic changes. She reports good fluid intake and denies diarrhea. She is taking all of her medications. Patient does note some dull, chronic chest pain going on for several weeks not associated with exertion. No radiation, sob, n/v or diaphoresis. She reports this is similar sensation to reflux. She has checked her HR (60s) and BP (SBP 110-150). Will forward this to Dr. Graciela Husbands and Dr. Mariah Milling for further recommendations. Have advised to continue fluid intake and rise slowly on ambulating. Reassured patient that reprogrammed setting should make her feel better. Advised that if her symptoms worsen throughout the night, to call her daughter and present to the nearest ED. Will leave message at office as well. Patient understood and agreed.    Jacqulyn Bath, PA-C 05/23/2012 7:53 PM

## 2012-05-24 ENCOUNTER — Telehealth: Payer: Self-pay | Admitting: Internal Medicine

## 2012-05-24 NOTE — Telephone Encounter (Signed)
LMTCb with Baxter Hire in [employer]

## 2012-05-24 NOTE — Telephone Encounter (Signed)
[name] with [facility] called back She can be here at 1000 tomm am Pt is agreeable to this and asks that I call her dtr, [name] at [phone], to explain as well Attempted to call [name] and [facility]

## 2012-05-24 NOTE — Telephone Encounter (Signed)
Pt says she went for device check as scheduled Monday 7/22 and felt better after the changes to device were made.  Tuesday she began to feel bad again and Wednesday even worse.  She says she contemplated going to ER last night but spoke with on call PA instead.  She was feeling dizzy/palpitations, like "heart was beating hard".  She lives alone and is concerned.  At the present time she is asymptomatic, says symptoms get worse with exertion throughout the day.  I have asked her to send in  PPM transmission to [address] and I would let those nurses know.  Once we receive the transmission one of [place] will call the patient back with feedback. Understanding verb.  I called Amber and made her aware.

## 2012-05-24 NOTE — Telephone Encounter (Signed)
Pt called stating that she had fast heart rate and was dizzy. Pt called on call physician last night (SEE NOTE)

## 2012-05-24 NOTE — Telephone Encounter (Signed)
I spoke with [name] in [place]. She received pt's transmission and says it shows her to be in atrial fib for the last 18 hours with rates between 70-80 BPM.  Mode switch is set for 171 BPM Otherwise no other issues noted with PPM

## 2012-05-24 NOTE — Telephone Encounter (Signed)
I called pt to tell her of the findings  She says she feels poorly in atrial fib and wonders if there are any other changes that can be made with PPM Dr. Graciela Husbands is on vacation, otherwise I would page him I reassured her rates are between 70-80 BPM in atrial fib, but she is not tolerating this well I offered to bring her in for device check tomm with [facility] rep She is agreeable to this I had her hold while I paged [employer] rep to see what time is best

## 2012-05-25 ENCOUNTER — Ambulatory Visit (INDEPENDENT_AMBULATORY_CARE_PROVIDER_SITE_OTHER): Payer: Medicare Other

## 2012-05-25 VITALS — BP 120/60 | HR 80 | Ht 62.0 in | Wt 136.0 lb

## 2012-05-25 DIAGNOSIS — R002 Palpitations: Secondary | ICD-10-CM

## 2012-05-25 DIAGNOSIS — Z5181 Encounter for therapeutic drug level monitoring: Secondary | ICD-10-CM

## 2012-05-25 DIAGNOSIS — R42 Dizziness and giddiness: Secondary | ICD-10-CM

## 2012-05-25 DIAGNOSIS — I4891 Unspecified atrial fibrillation: Secondary | ICD-10-CM

## 2012-05-25 DIAGNOSIS — I4892 Unspecified atrial flutter: Secondary | ICD-10-CM

## 2012-05-25 NOTE — Progress Notes (Signed)
Pt c/o feeling dizzy/palpitations x 2-3 days She went for device check in [place] Monday 7/22 Device settings were changed to DDDR/110 She says she felt much better Monday, after changes, but then began to feel poorly Tuesday through the present I had her sent [facility] transmission yesterday. This showed her to be in "atrial fib for the last 18 hours" I scheduled her to come in today to make sure device is working properly  [employer] rep present in office Interrogation shows pt to be in an atrial tachycardia with an atrial rate of 271 BPM. Rhythm is regular It shows she went into this AT 7/24 afternoon This correlates with pt's symptoms  Rep is gathering more data then I will have to call [place] office/page Dr. Johney Frame to see if she should come in to be paced out of this rhythm (Dr. Graciela Husbands and Dr. Ladona Ridgel are on vacation)  I spoke with [place], [place] in [place] about situation. She suggests I page Dr. Johney Frame to get further instruction.  I paged Dr. Johney Frame. EP lab called me back says he is scrubbed in and will call me back.  I have advised pt to go home and await my call re: Dr.Allred's instruction. She verb. Understanding and will go eat with dtr and I will call dtr on her cell phone.  I spoke with Dr. Johney Frame about the issue and he suggests we schedule pt for cardioversion Monday.  Cardioversion scheduled for Monday 05/28/12 at 0730  I spoke with pt's dtr, [name], who is with pt. They are both agreeable to this and will come in to office now for pre procedure labs/CXR.

## 2012-05-25 NOTE — Patient Instructions (Addendum)
Await my call re: Dr. Jenel Lucks instruction.

## 2012-05-28 ENCOUNTER — Ambulatory Visit (INDEPENDENT_AMBULATORY_CARE_PROVIDER_SITE_OTHER): Payer: Medicare Other

## 2012-05-28 ENCOUNTER — Telehealth: Payer: Self-pay

## 2012-05-28 ENCOUNTER — Ambulatory Visit: Payer: Self-pay | Admitting: Cardiovascular Disease

## 2012-05-28 VITALS — BP 120/72 | HR 93 | Ht 62.0 in | Wt 132.0 lb

## 2012-05-28 DIAGNOSIS — I4892 Unspecified atrial flutter: Secondary | ICD-10-CM

## 2012-05-28 DIAGNOSIS — I4891 Unspecified atrial fibrillation: Secondary | ICD-10-CM

## 2012-05-28 LAB — PROTIME-INR
INR: 2.1 — AB (ref 0.9–1.1)
Prothrombin Time: 23.6 secs — ABNORMAL HIGH (ref 11.5–14.7)

## 2012-05-28 MED ORDER — METOPROLOL TARTRATE 25 MG PO TABS: 25.0000 mg | ORAL_TABLET | Freq: Two times a day (BID) | ORAL | Status: DC

## 2012-05-28 NOTE — Telephone Encounter (Signed)
I spoke with Baxter Hire in [place] who says she rec'd PPM transmission. Says pt is A-S, V-P with controlled rate I called pt to tell her this. She verb. Understanding but says HR still feels fast. I had her check HR while I held on phone.

## 2012-05-28 NOTE — Telephone Encounter (Signed)
Line busy

## 2012-05-28 NOTE — Telephone Encounter (Signed)
Pt called to say she had cardioversion as scheduled this am.  She came home and slept a while. Since she woke up, she feels jittery and feels HR is going fast. She says she checked HR and it was 91 BPM.  I advised her to send PPM transmission and we will investigate further. I also wanted to tell her some SE may be from anesthesia that may be wearing off but she hung up. I will try calling her back.

## 2012-05-28 NOTE — Progress Notes (Signed)
EKG performed Shows NSR, AV paced, rate=93 BPM Dr. Kirke Corin advised reassurance for pt and gave order to "Start metoprolol tartrate 25 mg PO BID"V.O Dr. Adline Mango, RN  Pt verb. Understanding New RX sent to pharm Will keep f/u with Dr. Kirke Corin for Monday 8/5 and will schedule 2 week f/u with Dr. Graciela Husbands

## 2012-05-28 NOTE — Patient Instructions (Addendum)
Your physician has recommended you make the following change in your medication:  Start metoprolol tartrate 25 mg twice daily  Follow up with Dr. Graciela Husbands in 2 weeks

## 2012-05-28 NOTE — Telephone Encounter (Signed)
Pt checked HR while on phone and HR=98 BPM. She is on no antiarrythmic meds/beta blockers. I advised pt to come in for EKG to confirm rhythm.  She verb. Understanding.

## 2012-05-30 ENCOUNTER — Ambulatory Visit (INDEPENDENT_AMBULATORY_CARE_PROVIDER_SITE_OTHER): Payer: Medicare Other

## 2012-05-30 ENCOUNTER — Telehealth: Payer: Self-pay

## 2012-05-30 VITALS — BP 138/72 | HR 63

## 2012-05-30 DIAGNOSIS — I4891 Unspecified atrial fibrillation: Secondary | ICD-10-CM

## 2012-05-30 NOTE — Telephone Encounter (Signed)
Pt says she is feeling dizzy "all the time", unrelated to movement/position change.  She says she has felt this way since cardioversion BP/HR=131/63, 67;123/58,67;109/56,70 She also says she went to ENT 2 weeks prior to cardioversion for same complaints and was dx with inner ear issues. She was given exercises to do and meds I tried reassuring her, based on good BP readings and HR and the fact that dizziness is not made worse with position change, she should probably f/u with ENT to make sure it is not a recurrence of inner ear trouble She does not think it is r/t inner ear I asked if she can send PPM transmission. If this looks normal, I suggest she go to ENT/PCP. Understanding verb.

## 2012-05-30 NOTE — Progress Notes (Signed)
Pt here for c/o dizziness; PPM TTM normal, orthostatic BP's normal She denies syncope/near syncope I advised pt to f/u with ENT since she has recent hx of vertigo r/t inner ear I had pt wait in lobby while I called Dr. Talmage Nap office to see if they had any availability this afternoon They do not have any openings today but can see pt tomm at 1030 I gave pt appt info and advised her to refrain from driving until she sees Dr. Willeen Cass and cause of dizziness is confirmed. Understanding verb.

## 2012-05-30 NOTE — Telephone Encounter (Signed)
I called pt to inform her PPM looks to be fxn ok She says she is still dizzy Advised to come in for orthostatics Will come in now

## 2012-05-30 NOTE — Patient Instructions (Addendum)
Follow up with Dr. Willeen Cass (ENT) tomorrow 05/31/12 at 10:30 am

## 2012-05-30 NOTE — Telephone Encounter (Signed)
Pt is calling stating that she is VERY dizzy and cant hardly stand up.

## 2012-05-30 NOTE — Telephone Encounter (Signed)
Message copied by Marcelle Overlie on Wed May 30, 2012  3:58 PM ------      Message from: Lorine Bears A      Created: Wed May 30, 2012  3:44 PM      Regarding: RE: help       I agree with your assessment. It seems that her dizziness is likely non cardiac.             ----- Message -----         From: Marcelle Overlie, RN         Sent: 05/30/2012   3:35 PM           To: Iran Ouch, MD      Subject: help                                                     Can you please review my telephone note from today as well as office visit from today?      She's the one you cardioverted on Monday, still c/o feeling dizzy      PPM transmission showed her to be in NSR with controlled rate, home BPs were good (120-140 systolic)      She started metoprolol at your request Monday pm      I advised her to go to ENT for dizziness since she has recent hx (2 weeks ago) of inner ear issues/vertigo      I also brought her in office for orthostatics; no changes in BP (see note)      I advised her to f/u with ENT and made her an appt for tomm      She also has hosp f/u with you Monday 8/5      I would discuss with Graciela Husbands but he is on vacation      Anything I need to do differently?      Thanks!

## 2012-05-30 NOTE — Telephone Encounter (Signed)
I called [name] in [facility] device clinic. She says pt sent transmission today and it shows she is still in NSR with controlled rate with occasional PACs. I will call pt to inform

## 2012-06-03 ENCOUNTER — Telehealth: Payer: Self-pay | Admitting: Physician Assistant

## 2012-06-03 NOTE — Telephone Encounter (Signed)
Patient called Sunday morning reporting that she just does not feel well. She feels dizzy as she did before, somewhat weak. She has mild SOB. No CP. She is unable to tell if she is out of rhythm. She feels similarly to how she felt earlier in the week when she called in with NSR on pacemaker transmission, normal home BP. I did advise if she continues to feel worse I would advise that she go to ER for evaluation as there is no one in the office to "check her out." She is A+Ox3 and in sound state of mind by phone. She does not want to go and plans to wait until her appointment with Dr. Kirke Corin tomorrow. She does not drive and will be having one of her children take her. I encouraged her to proceed to emergency room if things change and she agreed. Addley Ballinger PA-C

## 2012-06-04 ENCOUNTER — Other Ambulatory Visit: Payer: Self-pay | Admitting: Internal Medicine

## 2012-06-04 ENCOUNTER — Encounter: Payer: Self-pay | Admitting: Cardiovascular Disease

## 2012-06-04 ENCOUNTER — Ambulatory Visit (INDEPENDENT_AMBULATORY_CARE_PROVIDER_SITE_OTHER): Payer: Medicare Other | Admitting: Cardiovascular Disease

## 2012-06-04 ENCOUNTER — Other Ambulatory Visit: Payer: Self-pay

## 2012-06-04 ENCOUNTER — Other Ambulatory Visit (INDEPENDENT_AMBULATORY_CARE_PROVIDER_SITE_OTHER): Payer: Medicare Other

## 2012-06-04 ENCOUNTER — Ambulatory Visit (INDEPENDENT_AMBULATORY_CARE_PROVIDER_SITE_OTHER): Payer: Medicare Other

## 2012-06-04 VITALS — BP 120/72 | HR 157 | Ht 62.0 in | Wt 130.5 lb

## 2012-06-04 DIAGNOSIS — I4891 Unspecified atrial fibrillation: Secondary | ICD-10-CM

## 2012-06-04 DIAGNOSIS — R42 Dizziness and giddiness: Secondary | ICD-10-CM

## 2012-06-04 DIAGNOSIS — I4892 Unspecified atrial flutter: Secondary | ICD-10-CM

## 2012-06-04 DIAGNOSIS — R002 Palpitations: Secondary | ICD-10-CM

## 2012-06-04 DIAGNOSIS — I10 Essential (primary) hypertension: Secondary | ICD-10-CM

## 2012-06-04 DIAGNOSIS — Z7901 Long term (current) use of anticoagulants: Secondary | ICD-10-CM

## 2012-06-04 LAB — POCT INR: INR: 2.3

## 2012-06-04 NOTE — Patient Instructions (Addendum)
Your physician has requested that you have an echocardiogram. Echocardiography is a painless test that uses sound waves to create images of your heart. It provides your doctor with information about the size and shape of your heart and how well your heart's chambers and valves are working. This procedure takes approximately one hour. There are no restrictions for this procedure.   

## 2012-06-04 NOTE — Assessment & Plan Note (Signed)
The patient has recurrent atrial flutter. She underwent successful cardioversion last week that she is back in atrial flutter noted on her ECG. The ventricular rate is normal given that she had previous AV nodal ablation and pacemaker placement. However, she appears to be symptomatic when she is in this rhythm although it cannot account for all of her symptoms given that she continued to feel slightly dizzy even shortly after the cardioversion. I recommend an echocardiogram. Previous ejection fraction was normal. Given that this is a recurrent episode of atrial flutter, I recommend EP evaluation for possible flutter ablation. She already has an appointment scheduled with Dr. Graciela Husbands on August 15. Continue metoprolol 25 mg twice daily as well as anticoagulation with warfarin.

## 2012-06-04 NOTE — Progress Notes (Signed)
HPI  [name] is an 78 year old female who is here today for followup visit after recent cardioversion. She has a history of sick sinus syndrome and atrial fibrillation status post AV node ablation followed by pacemaker implantation.  She was seen recently for symptoms of dizziness. Device interrogation indicated atrial flutter which seems to be a new problem for her. She underwent successful cardioversion last week to normal sinus rhythm. She felt better for about a day or 2 although her symptoms did not completely resolve. She just does not feel well. She is dizzy and tired. She seems to be very anxious as well. He is having hard time sleeping at night. She came back today after the cardioversion and got her blood pressure checked. She was noted to be in sinus rhythm at that time with PACs. She was started on metoprolol 25 mg twice daily.  Allergies  Allergen Reactions  . Amoxicillin   . Levofloxacin   . Plavix (Clopidogrel Bisulfate)      Current Outpatient Prescriptions on File Prior to Visit  Medication Sig Dispense Refill  . ALPRAZolam (XANAX) 0.25 MG tablet Take 0.25 mg by mouth at bedtime as needed.      [name] amLODipine (NORVASC) 5 MG tablet Take 5 mg by mouth daily.        . Cetirizine HCl (ZYRTEC ALLERGY PO) Take by mouth. prn      . fluticasone (FLONASE) 50 MCG/ACT nasal spray Place 2 sprays into the nose daily.      . folic acid (FOLVITE) 1 MG tablet Take 1 mg by mouth daily.        [name] gabapentin (NEURONTIN) 400 MG capsule Take 400 mg by mouth daily.      . metoprolol tartrate (LOPRESSOR) 25 MG tablet Take 1 tablet (25 mg total) by mouth 2 (two) times daily.  180 tablet  3  . Pantoprazole Sodium (PROTONIX PO) Take 40 mg by mouth daily.      [name] warfarin (COUMADIN) 2 MG tablet Take 1 tablet (2 mg total) by mouth as directed.  30 tablet  3  . warfarin (COUMADIN) 3 MG tablet Take 3 mg by mouth as directed.          Past Medical History  Diagnosis Date  . Non Hodgkin's lymphoma      stable; followed by an oncologist  . Hyperlipidemia   . Hypertension   . Hereditary hemochromatosis     Phlebotomized in the past, not getting phlebotomy now  . GERD (gastroesophageal reflux disease)   . Complete heart block  s/p av ablation     with atrial fibrillation. Pt is s/p AV nodal ablation due to permanent and symptomatic atrial fibrillation.   . Coronary artery disease     Nonobstructive  . C. difficile colitis     feces transplant     Past Surgical History  Procedure Date  . Cardiac catheterization   . Insert / replace / remove pacemaker   . Abdominal hysterectomy   . Cholecystectomy   . Atrial ablation surgery 2008    AV nodal ablation  . US echocardiography 07/2008    EF 55%, mild LVH. No significant valvular abnormalities  . Colonoscopy oct.12, 2012  . Stool transplant   . Cardioversion      Family History  Problem Relation Age of Onset  . Liver disease Father   . Cancer Mother      History   Social History  . Marital Status: Widowed  Spouse Name: N/A    Number of Children: N/A  . Years of Education: N/A   Occupational History  . Retired    Social History Main Topics  . Smoking status: Never Smoker   . Smokeless tobacco: Never Used  . Alcohol Use: No  . Drug Use: No  . Sexually Active:    Other Topics Concern  . Not on file   Social History Narrative  . No narrative on file      PHYSICAL EXAM   BP 120/72  Pulse 157  Ht 5\' 2"  (1.575 m)  Wt 130 lb 8 oz (59.194 kg)  BMI 23.87 kg/m2  Constitutional: She is oriented to person, place, and time. She appears well-developed and well-nourished. No distress.  HENT: No nasal discharge.  Head: Normocephalic and atraumatic.  Eyes: Pupils are equal and round. Right eye exhibits no discharge. Left eye exhibits no discharge.  Neck: Normal range of motion. Neck supple. No JVD present. No thyromegaly present.  Cardiovascular: Normal rate, regular rhythm, normal heart sounds. Exam reveals  no gallop and no friction rub. No murmur heard.  Pulmonary/Chest: Effort normal and breath sounds normal. No stridor. No respiratory distress. She has no wheezes. She has no rales. She exhibits no tenderness.  Abdominal: Soft. Bowel sounds are normal. She exhibits no distension. There is no tenderness. There is no rebound and no guarding.  Musculoskeletal: Normal range of motion. She exhibits no edema and no tenderness.  Neurological: She is alert and oriented to person, place, and time. Coordination normal.  Skin: Skin is warm and dry. No rash noted. She is not diaphoretic. No erythema. No pallor.  Psychiatric: She has a normal mood and affect. Her behavior is normal. Judgment and thought content normal.   ZOX:WRUEAV flutter. Electronic ventricular pacemaker  -possibly demand type    ASSESSMENT AND PLAN

## 2012-06-05 ENCOUNTER — Telehealth: Payer: Self-pay

## 2012-06-05 NOTE — Telephone Encounter (Signed)
Patient would like to know when can drive. Patient still has some dizziness. Please advise.

## 2012-06-07 NOTE — Telephone Encounter (Signed)
Told patient would not drive while still feeling dizzy. The patient will await to here from Dr. Kirke Corin on Monday,  Aug. 12, 2013.

## 2012-06-14 ENCOUNTER — Ambulatory Visit (INDEPENDENT_AMBULATORY_CARE_PROVIDER_SITE_OTHER): Payer: Medicare Other | Admitting: Internal Medicine

## 2012-06-14 ENCOUNTER — Encounter: Payer: Self-pay | Admitting: Internal Medicine

## 2012-06-14 VITALS — BP 123/72 | HR 62 | Ht 62.0 in | Wt 132.0 lb

## 2012-06-14 DIAGNOSIS — I10 Essential (primary) hypertension: Secondary | ICD-10-CM

## 2012-06-14 DIAGNOSIS — I4891 Unspecified atrial fibrillation: Secondary | ICD-10-CM

## 2012-06-14 DIAGNOSIS — Z950 Presence of cardiac pacemaker: Secondary | ICD-10-CM

## 2012-06-14 NOTE — Assessment & Plan Note (Signed)
Her blood pressure is well-controlled we will stop her metoprolol

## 2012-06-14 NOTE — Assessment & Plan Note (Signed)
I spent 30+ minutes reviewing the old electrocardiograms and the results with the patient and her daughter regarding the burden of atrial arrhythmias over the last years. I do not think her atrial arrhythmias were responsible for her symptoms of late. There was an issue where in her pacemaker was programmed in a DDI mode and with a transient return to sinus rhythm she was A-V dissociated. Her device was reprogrammed to DDD with an upper rate limit of 110. She has had complaints of palpitations" her heart beating out of her chest". She is device dependent and so any fast beats are either tract beats or PVCs.

## 2012-06-14 NOTE — Patient Instructions (Addendum)
Your physician recommends that you schedule a follow-up appointment in: 8-10 weeks with Dr. Klein.  

## 2012-06-14 NOTE — Progress Notes (Signed)
  HPI  [name] is a 78 y.o. female Seen in followup for a history of sick sinus syndrome and atrial fibrillation status post AV node ablation followed by pacemaker implantation. She had some intermittent postprocedural palpitations. These were associated with tracking and addressed by programming DDIR from DDDR.    In the last 2 months, she was seen by Dr. Knute Neu and MA whom noted that she was out of rhythm. Cardioversion was undertaken and she rapidly reverted back to atrial arrhythmias. And at least on one occasion appears to be atypical like flutter. Perhaps unbeknownst to them, she is out of rhythm 85-90% of the time over the last number of years and we have undertaken rate control as a strategy because of the inability to maintain sinus. Metoprolol was started. She is now seen at the request of Dr. Marcheta Grammes for question of ablation Past Medical History  Diagnosis Date  . Non Hodgkin's lymphoma     stable; followed by an oncologist  . Hyperlipidemia   . Hypertension   . Hereditary hemochromatosis     Phlebotomized in the past, not getting phlebotomy now  . GERD (gastroesophageal reflux disease)   . Complete heart block  s/p av ablation     with atrial fibrillation. Pt is s/p AV nodal ablation due to permanent and symptomatic atrial fibrillation.   . Coronary artery disease     Nonobstructive  . C. difficile colitis     feces transplant    Past Surgical History  Procedure Date  . Cardiac catheterization   . Insert / replace / remove pacemaker   . Abdominal hysterectomy   . Cholecystectomy   . Atrial ablation surgery 2008    AV nodal ablation  . US echocardiography 07/2008    EF 55%, mild LVH. No significant valvular abnormalities  . Colonoscopy oct.12, 2012  . Stool transplant   . Cardioversion     Current Outpatient Prescriptions  Medication Sig Dispense Refill  . ALPRAZolam (XANAX) 0.25 MG tablet Take 0.25 mg by mouth at bedtime as needed.      [name] amLODipine (NORVASC) 5 MG  tablet Take 5 mg by mouth daily.        . Cetirizine HCl (ZYRTEC ALLERGY PO) Take by mouth daily.       . fluticasone (FLONASE) 50 MCG/ACT nasal spray Place 2 sprays into the nose daily.      . folic acid (FOLVITE) 1 MG tablet Take 1 mg by mouth daily.        [name] gabapentin (NEURONTIN) 400 MG capsule Take 400 mg by mouth daily.      . metoprolol tartrate (LOPRESSOR) 25 MG tablet Take 1 tablet (25 mg total) by mouth 2 (two) times daily.  180 tablet  3  . Pantoprazole Sodium (PROTONIX PO) Take 40 mg by mouth daily.      [name] warfarin (COUMADIN) 2 MG tablet Take 1 tablet (2 mg total) by mouth as directed.  30 tablet  3  . warfarin (COUMADIN) 3 MG tablet Take 3 mg by mouth as directed.         Allergies  Allergen Reactions  . Amoxicillin   . Levofloxacin   . Plavix (Clopidogrel Bisulfate)     Review of Systems negative except from HPI and PMH  Physical Exam BP 123/72  Pulse 62  Ht 5\' 2"  (1.575 m)  Wt 132 lb (59.875 kg)  BMI 24.14 kg/m2     Assessment and  Plan

## 2012-06-14 NOTE — Assessment & Plan Note (Signed)
The patient's device was interrogated.  The information was reviewed. She was left in DDD but URL>>90

## 2012-06-15 NOTE — Telephone Encounter (Signed)
Patient saw Dr. Graciela Husbands on 06/14/2012 and driving was discussed at office visit to not drive while feeling dizzy.

## 2012-06-22 ENCOUNTER — Telehealth: Payer: Self-pay | Admitting: Internal Medicine

## 2012-06-22 NOTE — Telephone Encounter (Signed)
Pt says she feels real weak and has been having DOE since PPM change in office last week.  I reassured her, based on Dr. Odessa Fleming note, her symptoms are not cardiac related.  She verb. Understanding and thinks it may also be attributed to vertigo issues.  She has appt with PCP next week (06/25/12).  She asks if I could check with Dr. Graciela Husbands to see if there is anything else she needs to do.  I explained he is in [place] but that I would send him a message.  She denies being in acute distress and will send PPM transmission as well. I will call [place] in a few minutes to see if they have rec'd transmission.

## 2012-06-22 NOTE — Telephone Encounter (Signed)
I called pt, who says she thinks she needs to change batteries in home monitor. She says "im all right". i reassured her I sent note to Dr. Graciela Husbands and I will call her when he responds. I reassured her, based on Dr. Odessa Fleming last note, her arrythmia is chronic and PPM has been adjusted to optimal settings.  She verb. Understanding and tells me she doesn't think her symptoms are cardiac related.  She will keep appt with PCP 8/26

## 2012-06-22 NOTE — Telephone Encounter (Signed)
Please see below. Pt feels more weak and DOE since office visit last week. i tried reassuring her symptoms are likely non cardiac. She asks that I check with you. i am also having her send pacemaker transmission.

## 2012-06-22 NOTE — Telephone Encounter (Signed)
Patient called and ever since her reprogramming of her pacemaker last week she has had extreme fatigue, would like for RN to call her back.

## 2012-06-22 NOTE — Telephone Encounter (Signed)
Per [place] office, they have not received a transmission.  I will call pt.

## 2012-06-25 ENCOUNTER — Telehealth: Payer: Self-pay

## 2012-06-25 LAB — BASIC METABOLIC PANEL
BUN/Creatinine Ratio: 17 (ref 11–26)
CO2: 21 mmol/L (ref 19–28)
Creatinine, Ser: 0.81 mg/dL (ref 0.57–1.00)
GFR calc Af Amer: 78 mL/min/{1.73_m2} (ref >59)
GFR calc non Af Amer: 68 mL/min/{1.73_m2} (ref >59)
Sodium: 135 mmol/L (ref 134–144)

## 2012-06-25 NOTE — Telephone Encounter (Signed)
If she feels that way, have her come back and paula can reprogram her back thanks

## 2012-06-25 NOTE — Telephone Encounter (Signed)
Gunnar Fusi from [facility] office called to tell me they did receive PPM transmission on pt. It showed pt to be in atrial fib since last OV 8/15. No ventricular high rates or other abnormalities noted.

## 2012-06-26 NOTE — Telephone Encounter (Signed)
Pt informed.  She reassures me she is feeling much better. She says she went to PCP yesterday and was told to stop mirtazepine d/t SE. She has stopped this and is feeling better. She does not feel she needs to come in for PPM to be reprogrammed. She will keep appt with Dr. Graciela Husbands scheduled for October. She wonders if she will stay in atrial fib. I explained, based on his last office note, this may be a chronic issue for her and we will have to just manage her symptoms and rate control.  I reassured her stroke risk is low d/t the fact she is on coumadin. She verb. Understanding and will call us should she begin to feel poorly again.

## 2012-07-03 ENCOUNTER — Telehealth: Payer: Self-pay | Admitting: Internal Medicine

## 2012-07-03 NOTE — Telephone Encounter (Signed)
Pt returning nurse call, she can be reached at [phone]

## 2012-07-04 ENCOUNTER — Ambulatory Visit (INDEPENDENT_AMBULATORY_CARE_PROVIDER_SITE_OTHER): Payer: Medicare Other

## 2012-07-04 DIAGNOSIS — I4891 Unspecified atrial fibrillation: Secondary | ICD-10-CM

## 2012-07-04 DIAGNOSIS — Z7901 Long term (current) use of anticoagulants: Secondary | ICD-10-CM

## 2012-07-04 LAB — POCT INR: INR: 3

## 2012-07-04 NOTE — Telephone Encounter (Signed)
Pt says she rec'd call from [place] office re: labs. She tried calling them back yesterday but office was closed. I explained, according to lab result notes, Dr. Johney Frame wants pt to f/u with PCP re: elevated glucose. Pt verb. Understanding and asks that I fax results to Dr. Daniel Nones. She says she no longer sees Dr. Lorin Picket but now sees Dr. Graciela Husbands. I will fax results but also instructed pt to call office as well to discuss. Understanding verb.

## 2012-07-13 ENCOUNTER — Ambulatory Visit: Payer: Self-pay | Admitting: Internal Medicine

## 2012-07-13 LAB — CBC CANCER CENTER
Basophil #: 0.1 x10 3/mm (ref 0.0–0.1)
Basophil %: 1.2 %
HGB: 15.8 g/dL (ref 12.0–16.0)
Lymphocyte #: 3.4 x10 3/mm (ref 1.0–3.6)
Lymphocyte %: 39.4 %
Monocyte #: 0.8 x10 3/mm (ref 0.2–0.9)
Monocyte %: 9.2 %
Neutrophil #: 4.2 x10 3/mm (ref 1.4–6.5)
Neutrophil %: 48.6 %
Platelet: 251 x10 3/mm (ref 150–440)
WBC: 8.6 x10 3/mm (ref 3.6–11.0)

## 2012-07-13 LAB — CREATININE, SERUM
Creatinine: 1.02 mg/dL (ref 0.60–1.30)
EGFR (African American): 59 — ABNORMAL LOW
EGFR (Non-African Amer.): 51 — ABNORMAL LOW

## 2012-07-13 LAB — FERRITIN: Ferritin (ARMC): 208 ng/mL (ref 8–388)

## 2012-07-14 LAB — AFP TUMOR MARKER: AFP-Tumor Marker: 9.3 ng/mL — ABNORMAL HIGH (ref 0.0–8.3)

## 2012-07-16 ENCOUNTER — Encounter: Payer: Medicare Other | Admitting: *Deleted

## 2012-07-25 ENCOUNTER — Ambulatory Visit: Payer: Self-pay | Admitting: Unknown Physician Specialty

## 2012-07-26 LAB — PATHOLOGY REPORT

## 2012-07-27 ENCOUNTER — Encounter: Payer: Self-pay | Admitting: *Deleted

## 2012-07-31 ENCOUNTER — Ambulatory Visit: Payer: Self-pay | Admitting: Internal Medicine

## 2012-08-08 ENCOUNTER — Ambulatory Visit (INDEPENDENT_AMBULATORY_CARE_PROVIDER_SITE_OTHER): Payer: Medicare Other

## 2012-08-08 DIAGNOSIS — I4891 Unspecified atrial fibrillation: Secondary | ICD-10-CM

## 2012-08-08 DIAGNOSIS — Z7901 Long term (current) use of anticoagulants: Secondary | ICD-10-CM

## 2012-08-08 LAB — POCT INR: INR: 2.5

## 2012-08-14 ENCOUNTER — Encounter: Payer: Self-pay | Admitting: Internal Medicine

## 2012-08-14 ENCOUNTER — Ambulatory Visit (INDEPENDENT_AMBULATORY_CARE_PROVIDER_SITE_OTHER): Payer: Medicare Other | Admitting: Internal Medicine

## 2012-08-14 VITALS — BP 128/62 | HR 76 | Ht 62.0 in | Wt 127.8 lb

## 2012-08-14 DIAGNOSIS — I493 Ventricular premature depolarization: Secondary | ICD-10-CM | POA: Insufficient documentation

## 2012-08-14 DIAGNOSIS — I10 Essential (primary) hypertension: Secondary | ICD-10-CM

## 2012-08-14 DIAGNOSIS — I4891 Unspecified atrial fibrillation: Secondary | ICD-10-CM

## 2012-08-14 DIAGNOSIS — Z950 Presence of cardiac pacemaker: Secondary | ICD-10-CM

## 2012-08-14 DIAGNOSIS — I4949 Other premature depolarization: Secondary | ICD-10-CM

## 2012-08-14 NOTE — Progress Notes (Signed)
Patient Care Team: Charm Barges, MD as PCP - General (Unknown Physician Specialty) Daniel Nones as Referring Physician (Internal Medicine)   HPI  [name] is a 78 y.o. female Seen in followup for a history of sick sinus syndrome and atrial fibrillation status post AV node ablation followed by pacemaker implantation.  Because of palpitations we have reprogrammed her device to the DD Mobitz and upper rate limit 110 trying to address her palpitations which are either tracked beats or PVC  Presumably  She is better.  Past Medical History  Diagnosis Date  . Non Hodgkin's lymphoma     stable; followed by an oncologist  . Hyperlipidemia   . Hypertension   . Hereditary hemochromatosis     Phlebotomized in the past, not getting phlebotomy now  . GERD (gastroesophageal reflux disease)   . Complete heart block  s/p av ablation     with atrial fibrillation. Pt is s/p AV nodal ablation due to permanent and symptomatic atrial fibrillation.   . Coronary artery disease     Nonobstructive  . C. difficile colitis     feces transplant    Past Surgical History  Procedure Date  . Cardiac catheterization   . Insert / replace / remove pacemaker   . Abdominal hysterectomy   . Cholecystectomy   . Atrial ablation surgery 2008    AV nodal ablation  . US echocardiography 07/2008    EF 55%, mild LVH. No significant valvular abnormalities  . Colonoscopy oct.12, 2012  . Stool transplant   . Cardioversion     Current Outpatient Prescriptions  Medication Sig Dispense Refill  . ALPRAZolam (XANAX) 0.25 MG tablet Take 0.25 mg by mouth at bedtime as needed.      [name] amLODipine (NORVASC) 5 MG tablet Take 5 mg by mouth daily.        . Cetirizine HCl (ZYRTEC ALLERGY PO) Take by mouth daily.       . fluticasone (FLONASE) 50 MCG/ACT nasal spray Place 2 sprays into the nose daily.      . folic acid (FOLVITE) 1 MG tablet Take 1 mg by mouth daily.        [name] gabapentin (NEURONTIN) 400 MG capsule Take 400 mg  by mouth daily.      . Pantoprazole Sodium (PROTONIX PO) Take 40 mg by mouth daily.      [name] warfarin (COUMADIN) 2 MG tablet Take 1 tablet (2 mg total) by mouth as directed.  30 tablet  3  . warfarin (COUMADIN) 3 MG tablet Take 3 mg by mouth as directed.         Allergies  Allergen Reactions  . Amoxicillin   . Levofloxacin   . Plavix (Clopidogrel Bisulfate)     Review of Systems negative except from HPI and PMH  Physical Exam BP 128/62  Pulse 76  Ht 5\' 2"  (1.575 m)  Wt 127 lb 12 oz (57.947 kg)  BMI 23.37 kg/m2 Well developed and well nourished in no acute distress HENT normal E scleral and icterus clear Neck Supple JVP flat; carotids brisk and full Clear to ausculation Regular rate and rhythm occasional irregularities early systolic murmur  Soft with active bowel sounds No clubbing cyanosis none Edema Alert and oriented, grossly normal motor and sensory function Skin Warm and Dry    Assessment and  Plan

## 2012-08-14 NOTE — Assessment & Plan Note (Signed)
Well controlled 

## 2012-08-14 NOTE — Assessment & Plan Note (Signed)
The patient's device was interrogated.  The information was reviewed. No changes were made in the programming.    

## 2012-08-14 NOTE — Assessment & Plan Note (Signed)
Atrial fibrillation which is long-standing persistent. AV junction ablation. On warfarin

## 2012-08-14 NOTE — Assessment & Plan Note (Signed)
Seems likely responsible for the palpitations as if her them today on auscultation. Will follow currently no changes in medication

## 2012-08-14 NOTE — Patient Instructions (Addendum)
Your physician wants you to follow-up in: January 20th for [facility] transmission. You will receive a reminder letter in the mail two months in advance. If you don't receive a letter, please call our office to schedule the follow-up appointment.

## 2012-08-20 DIAGNOSIS — H35079 Retinal telangiectasis, unspecified eye: Secondary | ICD-10-CM | POA: Insufficient documentation

## 2012-08-20 DIAGNOSIS — Z961 Presence of intraocular lens: Secondary | ICD-10-CM | POA: Insufficient documentation

## 2012-09-19 ENCOUNTER — Ambulatory Visit (INDEPENDENT_AMBULATORY_CARE_PROVIDER_SITE_OTHER): Payer: Medicare Other

## 2012-09-19 DIAGNOSIS — Z7901 Long term (current) use of anticoagulants: Secondary | ICD-10-CM

## 2012-09-19 DIAGNOSIS — I4891 Unspecified atrial fibrillation: Secondary | ICD-10-CM

## 2012-09-19 LAB — POCT INR: INR: 1.7

## 2012-10-08 DIAGNOSIS — Z9849 Cataract extraction status, unspecified eye: Secondary | ICD-10-CM | POA: Insufficient documentation

## 2012-10-10 ENCOUNTER — Other Ambulatory Visit: Payer: Self-pay

## 2012-10-10 MED ORDER — WARFARIN SODIUM 2 MG PO TABS: 2.0000 mg | ORAL_TABLET | ORAL | Status: DC

## 2012-10-10 MED ORDER — WARFARIN SODIUM 3 MG PO TABS: 3.0000 mg | ORAL_TABLET | ORAL | Status: DC

## 2012-10-17 ENCOUNTER — Ambulatory Visit (INDEPENDENT_AMBULATORY_CARE_PROVIDER_SITE_OTHER): Payer: Medicare Other

## 2012-10-17 DIAGNOSIS — Z7901 Long term (current) use of anticoagulants: Secondary | ICD-10-CM

## 2012-10-17 DIAGNOSIS — I4891 Unspecified atrial fibrillation: Secondary | ICD-10-CM

## 2012-10-17 LAB — POCT INR: INR: 2.2

## 2012-10-30 ENCOUNTER — Telehealth: Payer: Self-pay

## 2012-10-30 NOTE — Telephone Encounter (Signed)
Received t/c from pt's PCP, Dr. Daniel Nones, saying they checked pt's INR 12/30 which was 1.7 Wanted to make [place] aware Pt is awaiting our call with instruction

## 2012-11-01 NOTE — Telephone Encounter (Signed)
Pt denies missing any doses of Coumadin, no changes in medications, INR at primary MD office on 12/30 1.7.  Pt's usual dosage of Coumadin is 2mg  daily except 3mg  on MWF.  Advised pt to take 3mg  today, then resume same dosage .  Advised pt to keep scheduled appt in [facility] for repeat INR on 11/14/12.

## 2012-11-14 ENCOUNTER — Ambulatory Visit: Payer: Self-pay | Admitting: Internal Medicine

## 2012-11-15 LAB — BILIRUBIN, TOTAL: Bilirubin,Total: 2.2 mg/dL — ABNORMAL HIGH (ref 0.2–1.0)

## 2012-11-15 LAB — LACTATE DEHYDROGENASE: LDH: 467 U/L — ABNORMAL HIGH (ref 81–246)

## 2012-11-16 ENCOUNTER — Ambulatory Visit (INDEPENDENT_AMBULATORY_CARE_PROVIDER_SITE_OTHER): Payer: Medicare Other

## 2012-11-16 DIAGNOSIS — Z7901 Long term (current) use of anticoagulants: Secondary | ICD-10-CM

## 2012-11-16 DIAGNOSIS — I4891 Unspecified atrial fibrillation: Secondary | ICD-10-CM

## 2012-11-16 LAB — AFP TUMOR MARKER: AFP-Tumor Marker: 3.5 ng/mL (ref 0.0–8.3)

## 2012-11-16 LAB — POCT INR: INR: 2.3

## 2012-11-19 ENCOUNTER — Ambulatory Visit (INDEPENDENT_AMBULATORY_CARE_PROVIDER_SITE_OTHER): Payer: Medicare Other | Admitting: *Deleted

## 2012-11-19 DIAGNOSIS — Z950 Presence of cardiac pacemaker: Secondary | ICD-10-CM

## 2012-11-20 ENCOUNTER — Encounter: Payer: Self-pay | Admitting: Internal Medicine

## 2012-11-21 LAB — REMOTE PACEMAKER DEVICE
AL IMPEDENCE PM: 392 Ohm
BATTERY VOLTAGE: 2.79 V
RV LEAD IMPEDENCE PM: 536 Ohm

## 2012-12-01 ENCOUNTER — Ambulatory Visit: Payer: Self-pay | Admitting: Internal Medicine

## 2012-12-11 ENCOUNTER — Telehealth: Payer: Self-pay | Admitting: Physician Assistant

## 2012-12-11 ENCOUNTER — Encounter: Payer: Self-pay | Admitting: Internal Medicine

## 2012-12-11 ENCOUNTER — Ambulatory Visit (INDEPENDENT_AMBULATORY_CARE_PROVIDER_SITE_OTHER): Payer: Medicare Other | Admitting: Internal Medicine

## 2012-12-11 ENCOUNTER — Encounter: Payer: Self-pay | Admitting: *Deleted

## 2012-12-11 ENCOUNTER — Encounter (HOSPITAL_COMMUNITY): Payer: Self-pay | Admitting: Pharmacist

## 2012-12-11 VITALS — BP 120/63 | HR 137 | Ht 62.0 in | Wt 125.2 lb

## 2012-12-11 DIAGNOSIS — Z950 Presence of cardiac pacemaker: Secondary | ICD-10-CM

## 2012-12-11 DIAGNOSIS — I442 Atrioventricular block, complete: Secondary | ICD-10-CM | POA: Insufficient documentation

## 2012-12-11 DIAGNOSIS — I10 Essential (primary) hypertension: Secondary | ICD-10-CM

## 2012-12-11 DIAGNOSIS — I4891 Unspecified atrial fibrillation: Secondary | ICD-10-CM

## 2012-12-11 LAB — PROTIME-INR
INR: 2 ratio — ABNORMAL HIGH (ref 0.8–1.0)
Prothrombin Time: 21 s — ABNORMAL HIGH (ref 10.2–12.4)

## 2012-12-11 LAB — BASIC METABOLIC PANEL
CO2: 27 mEq/L (ref 19–32)
Chloride: 102 mEq/L (ref 96–112)
Creatinine, Ser: 0.7 mg/dL (ref 0.4–1.2)
Potassium: 4.6 mEq/L (ref 3.5–5.1)

## 2012-12-11 NOTE — Telephone Encounter (Signed)
Pt was seen in office today, had blood drawn and had pre-op visit for gen change on her PPM and had blood drawn. She is concerned that she had a knot at the blood draw site and wants to know if she should stop her coumadin pre-op. The knot is about the size of a quarter.  Advised her a small knot is OK, but she should keep an eye on it to make sure it does not get bigger. Call if any questions.  Advised her patients do not usually stop coumadin before a gen change, but will confirm this based on her INR today.  Advised her will have the [place] office call her in am to make sure she is OK, and have her come in if she is having more concerns.

## 2012-12-11 NOTE — Assessment & Plan Note (Signed)
Permanent. On anticoagulation. We've addressed the issue of  NOACs. She will consider changing.

## 2012-12-11 NOTE — Patient Instructions (Addendum)
Your physician has recommended that you have a pacemaker generator change.  Please see the instruction sheet given to you today for more information.  Your physician recommends that you continue on your current medications as directed. Please refer to the Current Medication list given to you today.

## 2012-12-11 NOTE — Assessment & Plan Note (Signed)
Device has reached ERI The benefits and risks were reviewed including but not limited to death,  perforation, infection, lead dislodgement and device malfunction.  The patient understands agrees and is willing to proceed.

## 2012-12-11 NOTE — Progress Notes (Signed)
Patient Care Team: Charm Barges, MD as PCP - General (Unknown Physician Specialty) Daniel Nones as Referring Physician (Internal Medicine)   HPI  [name] is a 79 y.o. female Seen in followup for a history of sick sinus syndrome and atrial fibrillation status post AV node ablation followed by pacemaker implantation.  Because of palpitations we head reprogrammed her device and it seemed like the palpitations related to PVCs  I saw her in October.  Her device reached ERI in December. SHe is tired and dizzy. This is been attributed to a year. Her Neurontin dose was recently increased.    Past Medical History  Diagnosis Date  . Non Hodgkin's lymphoma     stable; followed by an oncologist  . Hyperlipidemia   . Hypertension   . Hereditary hemochromatosis     Phlebotomized in the past, not getting phlebotomy now  . GERD (gastroesophageal reflux disease)   . Complete heart block  s/p av ablation     with atrial fibrillation. Pt is s/p AV nodal ablation due to permanent and symptomatic atrial fibrillation.   . Coronary artery disease     Nonobstructive  . C. difficile colitis     feces transplant    Past Surgical History  Procedure Laterality Date  . Cardiac catheterization    . Insert / replace / remove pacemaker    . Abdominal hysterectomy    . Cholecystectomy    . Atrial ablation surgery  2008    AV nodal ablation  . US echocardiography  07/2008    EF 55%, mild LVH. No significant valvular abnormalities  . Colonoscopy  oct.12, 2012  . Stool transplant    . Cardioversion      Current Outpatient Prescriptions  Medication Sig Dispense Refill  . ALPRAZolam (XANAX) 0.25 MG tablet Take 0.25 mg by mouth at bedtime as needed.      [name] amLODipine (NORVASC) 5 MG tablet Take 5 mg by mouth daily.        . Cetirizine HCl (ZYRTEC ALLERGY PO) Take by mouth daily.       . fluticasone (FLONASE) 50 MCG/ACT nasal spray Place 2 sprays into the nose daily.      . folic acid (FOLVITE) 1  MG tablet Take 1 mg by mouth daily.        [name] gabapentin (NEURONTIN) 400 MG capsule Take 400 mg by mouth daily.      . Pantoprazole Sodium (PROTONIX PO) Take 40 mg by mouth daily.      [name] warfarin (COUMADIN) 2 MG tablet Take 1 tablet (2 mg total) by mouth as directed.  30 tablet  3  . warfarin (COUMADIN) 3 MG tablet Take 1 tablet (3 mg total) by mouth as directed.  30 tablet  3   No current facility-administered medications for this visit.    Allergies  Allergen Reactions  . Amoxicillin   . Levofloxacin   . Plavix (Clopidogrel Bisulfate)     Review of Systems negative except from HPI and PMH  Physical Exam BP 120/63  Pulse 64  Ht 5\' 2"  (1.575 m)  Wt 125 lb 3.2 oz (56.79 kg)  BMI 22.89 kg/m2  Well developed and well nourished in no acute distress HENT normal E scleral and icterus clear Neck Supple JVP flat; carotids brisk and full Clear to ausculation Regular rate and rhythm occasional irregularities early systolic murmur  Soft with active bowel sounds No clubbing cyanosis none Edema Alert and oriented, grossly normal motor and sensory function  Skin Warm and Dry  Her ECG demonstrates atrial flutter with complete heart block and underlying ventricular pacing  Assessment and  Plan WNL

## 2012-12-11 NOTE — Assessment & Plan Note (Signed)
Blood pressure is well-controlled. At this point I would be inclined with the dizziness think stopping amlodipine was elevated the last time we'll follow it for now.

## 2012-12-11 NOTE — Assessment & Plan Note (Signed)
Stable post pacing 

## 2012-12-12 ENCOUNTER — Other Ambulatory Visit (INDEPENDENT_AMBULATORY_CARE_PROVIDER_SITE_OTHER): Payer: Medicare Other

## 2012-12-12 ENCOUNTER — Other Ambulatory Visit: Payer: Self-pay

## 2012-12-12 ENCOUNTER — Telehealth: Payer: Self-pay

## 2012-12-12 DIAGNOSIS — I495 Sick sinus syndrome: Secondary | ICD-10-CM

## 2012-12-12 LAB — CBC WITH DIFFERENTIAL
Basos: 1 % (ref 0–3)
Eos: 2 % (ref 0–5)
Eosinophils Absolute: 0.1 10*3/uL (ref 0.0–0.4)
HCT: 39.5 % (ref 34.0–46.6)
Hemoglobin: 13.8 g/dL (ref 11.1–15.9)
Immature Grans (Abs): 0 10*3/uL (ref 0.0–0.1)
Lymphs: 47 % — ABNORMAL HIGH (ref 14–46)
MCHC: 34.9 g/dL (ref 31.5–35.7)
Monocytes: 8 % (ref 4–12)
Neutrophils Absolute: 3.2 10*3/uL (ref 1.4–7.0)
Neutrophils Relative %: 42 % (ref 40–74)
RBC: 4.26 x10E6/uL (ref 3.77–5.28)
nRBC: 0 % (ref 0–0)

## 2012-12-12 NOTE — Telephone Encounter (Signed)
Per Bjorn Loser, [place], she left message on our machine asking [place] to call to assess pt's arm (see note from PA 2/11) Pt reports arm/knot has improved Says she is having PPM gen change 2/17 Denies any other complaints

## 2012-12-17 ENCOUNTER — Ambulatory Visit (HOSPITAL_COMMUNITY)
Admission: RE | Admit: 2012-12-17 | Discharge: 2012-12-17 | Disposition: A | Discharge status: Home / Self Care | Payer: Medicare Other | Source: Ambulatory Visit | Attending: Internal Medicine | Admitting: Internal Medicine

## 2012-12-17 ENCOUNTER — Encounter (HOSPITAL_COMMUNITY)
Admission: RE | Disposition: A | Discharge status: Home / Self Care | Payer: Self-pay | Source: Ambulatory Visit | Attending: Internal Medicine

## 2012-12-17 DIAGNOSIS — I10 Essential (primary) hypertension: Secondary | ICD-10-CM | POA: Insufficient documentation

## 2012-12-17 DIAGNOSIS — I442 Atrioventricular block, complete: Secondary | ICD-10-CM | POA: Insufficient documentation

## 2012-12-17 DIAGNOSIS — Z45018 Encounter for adjustment and management of other part of cardiac pacemaker: Secondary | ICD-10-CM | POA: Insufficient documentation

## 2012-12-17 DIAGNOSIS — E785 Hyperlipidemia, unspecified: Secondary | ICD-10-CM | POA: Insufficient documentation

## 2012-12-17 DIAGNOSIS — C8589 Other specified types of non-Hodgkin lymphoma, extranodal and solid organ sites: Secondary | ICD-10-CM | POA: Insufficient documentation

## 2012-12-17 HISTORY — PX: PACEMAKER GENERATOR CHANGE: SHX5481

## 2012-12-17 LAB — PROTIME-INR
INR: 2.1 — ABNORMAL HIGH (ref 0.00–1.49)
Prothrombin Time: 22.7 seconds — ABNORMAL HIGH (ref 11.6–15.2)

## 2012-12-17 LAB — SURGICAL PCR SCREEN: MRSA, PCR: NEGATIVE

## 2012-12-17 SURGERY — PACEMAKER GENERATOR CHANGE
Anesthesia: LOCAL

## 2012-12-17 MED ORDER — SODIUM CHLORIDE 0.9 % IR SOLN
80.0000 mg | Status: DC
  Filled 2012-12-17: qty 2

## 2012-12-17 MED ORDER — MUPIROCIN 2 % EX OINT
TOPICAL_OINTMENT | CUTANEOUS | Status: AC
  Filled 2012-12-17: qty 22

## 2012-12-17 MED ORDER — MIDAZOLAM HCL 5 MG/5ML IJ SOLN
INTRAMUSCULAR | Status: AC
  Filled 2012-12-17: qty 5

## 2012-12-17 MED ORDER — ONDANSETRON HCL 4 MG/2ML IJ SOLN: 4.0000 mg | Freq: Four times a day (QID) | INTRAMUSCULAR | Status: DC | PRN

## 2012-12-17 MED ORDER — SODIUM CHLORIDE 0.9 % IV SOLN: INTRAVENOUS | Status: AC

## 2012-12-17 MED ORDER — LIDOCAINE HCL (PF) 1 % IJ SOLN
INTRAMUSCULAR | Status: AC
  Filled 2012-12-17: qty 60

## 2012-12-17 MED ORDER — VANCOMYCIN HCL IN DEXTROSE 1-5 GM/200ML-% IV SOLN
1000.0000 mg | INTRAVENOUS | Status: DC
  Filled 2012-12-17: qty 200

## 2012-12-17 MED ORDER — SODIUM CHLORIDE 0.9 % IV SOLN: INTRAVENOUS | Status: DC

## 2012-12-17 MED ORDER — FENTANYL CITRATE 0.05 MG/ML IJ SOLN
INTRAMUSCULAR | Status: AC
  Filled 2012-12-17: qty 2

## 2012-12-17 MED ORDER — SODIUM CHLORIDE 0.45 % IV SOLN
INTRAVENOUS | Status: DC
  Administered 2012-12-17: 07:00:00 via INTRAVENOUS

## 2012-12-17 MED ORDER — ACETAMINOPHEN 325 MG PO TABS: 325.0000 mg | ORAL_TABLET | ORAL | Status: DC | PRN

## 2012-12-17 MED ORDER — CHLORHEXIDINE GLUCONATE 4 % EX LIQD
60.0000 mL | Freq: Once | CUTANEOUS | Status: DC
  Filled 2012-12-17: qty 60

## 2012-12-17 MED ORDER — MUPIROCIN 2 % EX OINT
TOPICAL_OINTMENT | Freq: Two times a day (BID) | CUTANEOUS | Status: DC
  Administered 2012-12-17: 07:00:00 via NASAL
  Filled 2012-12-17: qty 22

## 2012-12-17 NOTE — CV Procedure (Signed)
Preoperative diagnosis complete heart block, previous pacer now at [facility] Postoperative diagnosis same/   Procedure: Generator replacement   Pocket revision  Following informed consent the patient was brought to the electrophysiology laboratory in place of the fluoroscopic table in the supine position after routine prep and drape lidocaine was infiltrated in the region of the previous incision and carried down to later the device pocket using sharp dissection and electrocautery. The pocket was opened the device was freed up and was explanted.  Interrogation of the previously implanted ventricular lead St Jude 1488  demonstrated an R wave of NONE millivolts., and impedance of 564ohms, and a pacing threshold of 1.0 volts at o.4 msec.    The previously implanted atrial lead St Jude 1488 added P-wave amplitude of n/a  illlivolts  and impedance of  376 ohms, and a pacing threshold of N/A volts at  milliseconds.  The leads were inspected. The leads were then attached to a Medtronic adapta L  pulse generator, serial number [id number].    The pocket was irrigated with antibiotic containing saline solution hemostasis was assured and the leads and the device were placed in the pocket. The wound is then closed in 2 layers in normal fashion.  The patient tolerated the procedure without apparent complication.  Sherryl Manges

## 2012-12-17 NOTE — H&P (View-Only) (Signed)
Patient Care Team: Charlene S Scott, MD as PCP - General (Unknown Physician Specialty) Bert Yarisbel Miranda as Referring Physician (Internal Medicine)   HPI  [name] is a 79 y.o. female Seen in followup for a history of sick sinus syndrome and atrial fibrillation status post AV node ablation followed by pacemaker implantation.  Because of palpitations we head reprogrammed her device and it seemed like the palpitations related to PVCs  I saw her in October.  Her device reached ERI in December. SHe is tired and dizzy. This is been attributed to a year. Her Neurontin dose was recently increased.    Past Medical History  Diagnosis Date  . Non Hodgkin's lymphoma     stable; followed by an oncologist  . Hyperlipidemia   . Hypertension   . Hereditary hemochromatosis     Phlebotomized in the past, not getting phlebotomy now  . GERD (gastroesophageal reflux disease)   . Complete heart block  s/p av ablation     with atrial fibrillation. Pt is s/p AV nodal ablation due to permanent and symptomatic atrial fibrillation.   . Coronary artery disease     Nonobstructive  . C. difficile colitis     feces transplant    Past Surgical History  Procedure Laterality Date  . Cardiac catheterization    . Insert / replace / remove pacemaker    . Abdominal hysterectomy    . Cholecystectomy    . Atrial ablation surgery  2008    AV nodal ablation  . Us echocardiography  07/2008    EF 55%, mild LVH. No significant valvular abnormalities  . Colonoscopy  oct.12, 2012  . Stool transplant    . Cardioversion      Current Outpatient Prescriptions  Medication Sig Dispense Refill  . ALPRAZolam (XANAX) 0.25 MG tablet Take 0.25 mg by mouth at bedtime as needed.      . amLODipine (NORVASC) 5 MG tablet Take 5 mg by mouth daily.        . Cetirizine HCl (ZYRTEC ALLERGY PO) Take by mouth daily.       . fluticasone (FLONASE) 50 MCG/ACT nasal spray Place 2 sprays into the nose daily.      . folic acid (FOLVITE) 1  MG tablet Take 1 mg by mouth daily.        . gabapentin (NEURONTIN) 400 MG capsule Take 400 mg by mouth daily.      . Pantoprazole Sodium (PROTONIX PO) Take 40 mg by mouth daily.      . warfarin (COUMADIN) 2 MG tablet Take 1 tablet (2 mg total) by mouth as directed.  30 tablet  3  . warfarin (COUMADIN) 3 MG tablet Take 1 tablet (3 mg total) by mouth as directed.  30 tablet  3   No current facility-administered medications for this visit.    Allergies  Allergen Reactions  . Amoxicillin   . Levofloxacin   . Plavix (Clopidogrel Bisulfate)     Review of Systems negative except from HPI and PMH  Physical Exam BP 120/63  Pulse 64  Ht 5' 2" (1.575 m)  Wt 125 lb 3.2 oz (56.79 kg)  BMI 22.89 kg/m2  Well developed and well nourished in no acute distress HENT normal E scleral and icterus clear Neck Supple JVP flat; carotids brisk and full Clear to ausculation Regular rate and rhythm occasional irregularities early systolic murmur  Soft with active bowel sounds No clubbing cyanosis none Edema Alert and oriented, grossly normal motor and sensory function   Skin Warm and Dry  Her ECG demonstrates atrial flutter with complete heart block and underlying ventricular pacing  Assessment and  Plan WNL 

## 2012-12-17 NOTE — Discharge Instructions (Signed)
Pacemaker Battery Change A pacemaker battery usually lasts 4 to 12 years. Once or twice per year, you will be asked to visit your caregiver to have a full evaluation of your pacemaker. When a battery needs to be replaced, the entire pacemaker is actually replaced so that you can benefit from new circuitry and any new features that have recently been added to pacemakers. Most often, this procedure is very simple because the leads are already in place. After giving medicine to numb the skin, your health care provider makes a cut to reopen the pocket holding the pacemaker and disconnects the old device from its leads. The leads are routinely tested at this time. If they are working okay, the new pacemaker may simply be connected to the existing leads. If there is any problem with the old lead system, it may be wise to replace the lead system while inserting the new pacemaker. There are many things that affect how long a pacemaker battery will last:  Age of the pacemaker.  Number of leads (1, 2 or 3).  Pacemaker work load. If the pacemaker is helping the heart more often, then the battery will not last as long as if the pacemaker does not need to help the heart.  Resistance of the leads. The greater the resistance, the greater the drain on the battery. This can increase as the leads get older or if one or more of the leads does not have the best contact with the heart.  Power (voltage) settings.  The health of the person's heart. If the health of the heart gets worse, then the pacemaker may have to work more often and the setting changed to accommodate these changes. Your health care provider will be alerted to the fact that it is time to replace the battery during follow-up exams. He or she will check your pacemaker using a small table-top computer, called a programmer, and a wand. The wand is about the same size as a remote control. Your provider puts the wand on your body in the area where the  pacemaker is located. Information from the pacemaker is received about how well your heart is working and the status of the battery. It is not painful, and it usually takes just a few minutes. You will have plenty of time before the battery is fully used up to plan for replacement.  LET YOUR CAREGIVER KNOW ABOUT:   Symptoms of chest pain, trouble breathing, palpitations, lightheadedness, or feelings of an abnormal or irregular heart beat.  Allergies.  Medications taken including herbs, eye drops, over the counter medications, and creams  Use of steroids (by mouth or creams).  Possible pregnancy, if applicable.  Previous problems with anesthetics or Novocaine.  History of blood clots (thrombophlebitis).  History of bleeding or blood problems.  Surgery since your last pacemaker placement.  Other health problems. RISKS AND COMPLICATIONS These are very uncommon but include:  Bleeding.  Bruising of the skin around where the incision was made.  Pain at the site of the incision.  Pulling apart of the skin at the incision site.  Infection.  Allergic reaction to anesthetics or medicines used during the procedure. Diabetics may have a temporary increase in their blood sugar after any surgical procedure.  BEFORE THE PROCEDURE  Wash all of the skin around the area of the chest where the pacemaker is located. Try to remove any loose, scaling skin. Unless advised otherwise, avoid using aspirin, ibuprofen, or naproxen for 3-4 days before the procedure.  Ask your caregiver for help with any other medication adjustments before the pacemaker is replaced. Unless advised otherwise, do not eat or drink after midnight on the night before the procedure EXCEPT for drinking water and taking your medications as you normally would. AFTER THE PROCEDURE   A heart monitor and the pacemaker programmer will be used to make sure that the new pacemaker is working properly.  You can go home after the  procedure.  Your caregiver will advise you if you need to have any stitches. They will be removed 5-7 days after the procedure. HOME CARE INSTRUCTIONS   Keep the incision clean and dry.  Unless advised otherwise, you may shower after carefully covering the incision with plastic wrap that is taped to your chest.  For the first week after the replacement, avoid stretching motions that pull at the incision site and avoid heavy exercise with the arm on the same side as the incision.  Only take over-the-counter or prescription medicines for pain, discomfort, or fever as directed by your caregiver.  Your caregiver will tell you when you will need to next test your pacemaker by telephone or when to return to the office for re-exam and/or removal of stitches, if necessary. SEEK MEDICAL CARE IF:   You have unusual pain at the incision site that is not adequately helped by over-the-counter or prescription medicine.  There is drainage or pus from the incision site.  You develop red streaking that extends above or below the incision site.  You feel brief intermittent palpitations, lightheadedness or any symptoms that you feel might be related to your heart. SEEK IMMEDIATE MEDICAL CARE IF:   You experience chest pain that is different than the pain at the incision site.  You experience:  Shortness of breath.  Palpitations.  Irregular heart beat.  Lightheadedness that does not go away quickly.  Fainting.  You develop a fever.  You have pain that gets worse even though you are taking pain medicine. MAKE SURE YOU:   Understand these instructions.  Will watch your condition.  Will get help right away if you are not doing well or get worse. Document Released: 01/25/2007 Document Revised: 01/09/2012 Document Reviewed: 04/30/2007 [facility] Patient Information 2013 [place], [place].

## 2012-12-17 NOTE — Interval H&P Note (Signed)
History and Physical Interval Note:  12/17/2012 7:25 AM  [name]  has presented today for surgery, with the diagnosis of End of life  The various methods of treatment have been discussed with the patient and family. After consideration of risks, benefits and other options for treatment, the patient has consented to  Procedure(s): PACEMAKER GENERATOR CHANGE (N/A) as a surgical intervention .  The patient's history has been reviewed, patient examined, no change in status, stable for surgery.  I have reviewed the patient's chart and labs.  Questions were answered to the patient's satisfaction.     Sherryl Manges

## 2012-12-19 ENCOUNTER — Ambulatory Visit (INDEPENDENT_AMBULATORY_CARE_PROVIDER_SITE_OTHER): Payer: Medicare Other

## 2012-12-19 ENCOUNTER — Encounter: Payer: Medicare Other | Admitting: Internal Medicine

## 2012-12-19 DIAGNOSIS — I4891 Unspecified atrial fibrillation: Secondary | ICD-10-CM

## 2012-12-19 DIAGNOSIS — Z7901 Long term (current) use of anticoagulants: Secondary | ICD-10-CM

## 2012-12-24 DIAGNOSIS — H35359 Cystoid macular degeneration, unspecified eye: Secondary | ICD-10-CM | POA: Insufficient documentation

## 2012-12-29 ENCOUNTER — Ambulatory Visit: Payer: Self-pay | Admitting: Internal Medicine

## 2013-01-01 ENCOUNTER — Encounter: Payer: Self-pay | Admitting: Internal Medicine

## 2013-01-01 ENCOUNTER — Ambulatory Visit (INDEPENDENT_AMBULATORY_CARE_PROVIDER_SITE_OTHER): Payer: Medicare Other | Admitting: Internal Medicine

## 2013-01-01 VITALS — BP 136/72 | HR 60 | Ht 62.0 in | Wt 126.0 lb

## 2013-01-01 DIAGNOSIS — I442 Atrioventricular block, complete: Secondary | ICD-10-CM

## 2013-01-01 DIAGNOSIS — I4891 Unspecified atrial fibrillation: Secondary | ICD-10-CM

## 2013-01-01 DIAGNOSIS — Z950 Presence of cardiac pacemaker: Secondary | ICD-10-CM

## 2013-01-01 DIAGNOSIS — R079 Chest pain, unspecified: Secondary | ICD-10-CM

## 2013-01-01 LAB — PACEMAKER DEVICE OBSERVATION
AL AMPLITUDE: 4 mv
ATRIAL PACING PM: 0
BAMS-0001: 150 {beats}/min
BATTERY VOLTAGE: 2.8 V
BRDY-0002RV: 60 {beats}/min
BRDY-0003RV: 120 {beats}/min
RV LEAD IMPEDENCE PM: 603 Ohm
RV LEAD IMPEDENCE PM: 605 Ohm
VENTRICULAR PACING PM: 100
VENTRICULAR PACING PM: 100

## 2013-01-01 NOTE — Progress Notes (Signed)
Patient Care Team: Daniel Nones as PCP - General (Internal Medicine)   HPI  [name] is a 79 y.o. female S/p pacemaker and AV abaltion for uncontrolled VR  Underwent device generator replacement 2/14 and device was programmed VVI without rate response  She has also had problems with chest pain assoc with upper GI gas   Cath [facility] and myoview neg 2001`  Past Medical History  Diagnosis Date  . Non Hodgkin's lymphoma     stable; followed by an oncologist  . Hyperlipidemia   . Hypertension   . Hereditary hemochromatosis     Phlebotomized in the past, not getting phlebotomy now  . GERD (gastroesophageal reflux disease)   . Complete heart block  s/p av ablation     with atrial fibrillation. Pt is s/p AV nodal ablation due to permanent and symptomatic atrial fibrillation.   . Coronary artery disease     Nonobstructive  . C. difficile colitis     feces transplant  . Atrial fibrillation 07/20/2009    Qualifier: Diagnosis of  By: Graciela Husbands, MD, [facility], [place]     Past Surgical History  Procedure Laterality Date  . Cardiac catheterization    . Abdominal hysterectomy    . Cholecystectomy    . Atrial ablation surgery  2008    AV nodal ablation  . US echocardiography  07/2008    EF 55%, mild LVH. No significant valvular abnormalities  . Colonoscopy  oct.12, 2012  . Stool transplant    . Cardioversion    . Insert / replace / remove pacemaker      Current Outpatient Prescriptions  Medication Sig Dispense Refill  . alendronate (FOSAMAX) 70 MG tablet Take 70 mg by mouth every 7 (seven) days. Takes on Tuesdays. Take with a full glass of water on an empty stomach.      . ALPRAZolam (XANAX) 0.25 MG tablet Take 0.25 mg by mouth at bedtime as needed for sleep.       [name] amLODipine (NORVASC) 5 MG tablet Take 5 mg by mouth daily.        . Cetirizine HCl (ZYRTEC ALLERGY PO) Take by mouth daily.       . fluticasone (FLONASE) 50 MCG/ACT nasal spray Place 2 sprays into the nose daily as  needed for allergies.       . folic acid (FOLVITE) 1 MG tablet Take 1 mg by mouth daily.        [name] gabapentin (NEURONTIN) 400 MG capsule Take 400 mg by mouth daily.      . Pantoprazole Sodium (PROTONIX PO) Take 40 mg by mouth daily.      . polyvinyl alcohol (LIQUIFILM TEARS) 1.4 % ophthalmic solution Place 2 drops into both eyes 3 (three) times daily as needed (for dry eyes).      . warfarin (COUMADIN) 2 MG tablet Take 2 mg by mouth every Tuesday, Thursday, Saturday, and Sunday at 6 PM.      . warfarin (COUMADIN) 3 MG tablet Take 3 mg by mouth every Monday, Wednesday, and Friday at 6 PM.       No current facility-administered medications for this visit.    Allergies  Allergen Reactions  . Amoxicillin   . Levofloxacin   . Plavix (Clopidogrel Bisulfate)     12/12/11 patient reports she has never taken Plavix before.     Review of Systems negative except from HPI and PMH  Physical Exam BP 136/72  Pulse 60  Ht 5\' 2"  (1.575  m)  Wt 126 lb (57.153 kg)  BMI 23.04 kg/m2 Well developed and well nourished in no acute distress HENT normal E scleral and icterus clear Neck Supple JVP flat; carotids brisk and full Clear to ausculation Regular rate and rhythm, no murmurs gallops or rub Soft with active bowel sounds No clubbing cyanosis none Edema Alert and oriented, grossly normal motor and sensory function Skin Warm and Dry  ECG: afl  With complete heart block and ventricular pacing            Assessment and  Plan

## 2013-01-01 NOTE — Assessment & Plan Note (Signed)
pernmaent on anticoagulation

## 2013-01-01 NOTE — Assessment & Plan Note (Signed)
asso with gas  Suspect GI in origin and with gas have recommmended mylanta  Will also get myoview given new onset symptoms  Also noted that Device was programmed DDD instead of DDDR

## 2013-01-01 NOTE — Patient Instructions (Addendum)
Your physician wants you to follow-up in: 6 months with Dr. Graciela Husbands and device check. You will receive a reminder letter in the mail two months in advance. If you don't receive a letter, please call our office to schedule the follow-up appointment.  We will call you with instructions regarding the stress test.

## 2013-01-01 NOTE — Assessment & Plan Note (Signed)
The patient's device was interrogated and the information was fully reviewed.  The device was reprogrammed as above. 

## 2013-01-03 ENCOUNTER — Telehealth: Payer: Self-pay

## 2013-01-03 ENCOUNTER — Ambulatory Visit: Payer: Self-pay | Admitting: Internal Medicine

## 2013-01-03 DIAGNOSIS — R079 Chest pain, unspecified: Secondary | ICD-10-CM

## 2013-01-07 ENCOUNTER — Telehealth: Payer: Self-pay | Admitting: *Deleted

## 2013-01-07 NOTE — Telephone Encounter (Signed)
Pt calling states her HR is 88 and wants to know if this is to high

## 2013-01-08 ENCOUNTER — Telehealth: Payer: Self-pay

## 2013-01-08 NOTE — Telephone Encounter (Signed)
Pt called back I reassured her HR=88 is not too high  She says she had an episode of palpitations and felt her heart was "skipping" yesterday I explained she has intermittent atrial fib/flutter and may feel this from time to time. I explained if she is symptomatic/these continue to increase in frequency, I can talk with Dr. Graciela Husbands about medication for this She verb understanding and reassures me these "spells" do not occur often and she will let us know should these increase in frequency I gave her preliminary stress test results (normal)

## 2013-01-08 NOTE — Telephone Encounter (Signed)
No answer "has a voice mailbox that has not been set up yet" 

## 2013-01-08 NOTE — Telephone Encounter (Signed)
Patient notified of stress test per Sabino Snipes, RN.

## 2013-01-09 ENCOUNTER — Encounter: Payer: Self-pay | Admitting: Internal Medicine

## 2013-01-09 ENCOUNTER — Ambulatory Visit (INDEPENDENT_AMBULATORY_CARE_PROVIDER_SITE_OTHER): Payer: Medicare Other | Admitting: *Deleted

## 2013-01-09 ENCOUNTER — Other Ambulatory Visit: Payer: Self-pay

## 2013-01-09 ENCOUNTER — Telehealth: Payer: Self-pay

## 2013-01-09 DIAGNOSIS — I442 Atrioventricular block, complete: Secondary | ICD-10-CM

## 2013-01-09 LAB — PACEMAKER DEVICE OBSERVATION
BATTERY VOLTAGE: 2.8 V
RV LEAD IMPEDENCE PM: 434 Ohm
VENTRICULAR PACING PM: 99.7

## 2013-01-09 NOTE — Telephone Encounter (Signed)
Unable to speak with anyone in device clinic appt made with device clinic today at 3 pm Pt informed

## 2013-01-09 NOTE — Telephone Encounter (Signed)
Pt states she was up all night with her heart "just not right". States she thinks it is her new pacemaker. She would like for you to call her.

## 2013-01-09 NOTE — Telephone Encounter (Signed)
Pt states, "something is not right with my heart". She says an adjustment was made at last OV ansd thinks she feels worse since this happened.   She says her heart is beating "very irregularly with least bit of movement She says she does not have new PPM box to send transmission since change out I had her hold while I called device clinic in [place]

## 2013-01-09 NOTE — Progress Notes (Signed)
PPM interrogation. 

## 2013-01-09 NOTE — Telephone Encounter (Signed)
I had to [facility] on Paula's voicemail

## 2013-01-10 ENCOUNTER — Other Ambulatory Visit: Payer: Self-pay

## 2013-01-10 DIAGNOSIS — I4891 Unspecified atrial fibrillation: Secondary | ICD-10-CM

## 2013-01-10 DIAGNOSIS — Z950 Presence of cardiac pacemaker: Secondary | ICD-10-CM

## 2013-01-10 DIAGNOSIS — R079 Chest pain, unspecified: Secondary | ICD-10-CM

## 2013-01-10 DIAGNOSIS — I442 Atrioventricular block, complete: Secondary | ICD-10-CM

## 2013-01-17 ENCOUNTER — Encounter: Payer: Self-pay | Admitting: Internal Medicine

## 2013-01-17 ENCOUNTER — Ambulatory Visit (INDEPENDENT_AMBULATORY_CARE_PROVIDER_SITE_OTHER): Payer: Medicare Other | Admitting: *Deleted

## 2013-01-17 VITALS — BP 118/74 | HR 61 | Ht 62.0 in | Wt 125.5 lb

## 2013-01-17 DIAGNOSIS — I4891 Unspecified atrial fibrillation: Secondary | ICD-10-CM

## 2013-01-17 DIAGNOSIS — I10 Essential (primary) hypertension: Secondary | ICD-10-CM

## 2013-01-17 DIAGNOSIS — I4949 Other premature depolarization: Secondary | ICD-10-CM

## 2013-01-17 DIAGNOSIS — I493 Ventricular premature depolarization: Secondary | ICD-10-CM

## 2013-01-17 DIAGNOSIS — I442 Atrioventricular block, complete: Secondary | ICD-10-CM

## 2013-01-17 DIAGNOSIS — R0602 Shortness of breath: Secondary | ICD-10-CM

## 2013-01-17 DIAGNOSIS — I495 Sick sinus syndrome: Secondary | ICD-10-CM

## 2013-01-17 NOTE — Progress Notes (Signed)
PPM check 

## 2013-01-17 NOTE — Telephone Encounter (Signed)
error 

## 2013-01-24 ENCOUNTER — Telehealth: Payer: Self-pay | Admitting: *Deleted

## 2013-01-24 NOTE — Telephone Encounter (Signed)
Pt says she is in afib and wants to talk to the nurse. Gunnar Fusi told her to wait until Monday and see how she felt. She is in and out

## 2013-01-24 NOTE — Telephone Encounter (Signed)
Pt says she feels she is going in and out of atrial fib I explained she may be but we are aware of this  She says it makes her feel "weak" She asks if this is to be expected and asks if medications would help I told her  would send message to Dr. Graciela Husbands and call her back Understanding verb

## 2013-01-25 ENCOUNTER — Telehealth: Payer: Self-pay

## 2013-01-25 NOTE — Telephone Encounter (Signed)
Pt called again, asking if I have gotten a response from Dr. Graciela Husbands re: paroxysmal atrial fib and symptoms I explained he is out of office today She tells me she feels she is in atrial fib today I explained we are aware of this paroxysmal atrial fib and this may be a chronic issue for her but that symptom management may be the big issue She continues to report dizziness and fatigue with this She mentions a chronic fear is having a CVA I explained she should be protected from CVA while anticoagulated I reassured her last INR was WNL and risk for CVA from atrial fib should be low She felt reassured of this Her other main issue is symptoms in atrial fib She would like appt to discuss in further detail with Dr. Graciela Husbands next week I explained he is not in our [place] office next week; she is willing to go to [place] office I attempted to reach Herbert Seta (Dr. Odessa Fleming nurse) but she is out of office today, to get approval to schedule pt for appt Tuesday 4/1 at 1:45 I gave pt appt time and date with the explanation that we may have to r/s appt if Dr. Graciela Husbands unable to see pt Tuesday as we have scheduled Understanding verb

## 2013-01-25 NOTE — Telephone Encounter (Signed)
Please see below as to what I told pt Let me know if you guys are unable to see her Tuesday Thanks!!

## 2013-01-27 ENCOUNTER — Emergency Department (HOSPITAL_COMMUNITY): Payer: Medicare Other

## 2013-01-27 ENCOUNTER — Telehealth: Payer: Self-pay | Admitting: Physician Assistant

## 2013-01-27 ENCOUNTER — Encounter (HOSPITAL_COMMUNITY): Payer: Self-pay | Admitting: *Deleted

## 2013-01-27 ENCOUNTER — Emergency Department (HOSPITAL_COMMUNITY)
Admission: EM | Admit: 2013-01-27 | Discharge: 2013-01-27 | Disposition: A | Discharge status: Home / Self Care | Payer: Medicare Other | Attending: Emergency Medicine | Admitting: Emergency Medicine

## 2013-01-27 DIAGNOSIS — IMO0002 Reserved for concepts with insufficient information to code with codable children: Secondary | ICD-10-CM | POA: Insufficient documentation

## 2013-01-27 DIAGNOSIS — R002 Palpitations: Secondary | ICD-10-CM

## 2013-01-27 DIAGNOSIS — Z8619 Personal history of other infectious and parasitic diseases: Secondary | ICD-10-CM | POA: Insufficient documentation

## 2013-01-27 DIAGNOSIS — Z9861 Coronary angioplasty status: Secondary | ICD-10-CM | POA: Insufficient documentation

## 2013-01-27 DIAGNOSIS — Z950 Presence of cardiac pacemaker: Secondary | ICD-10-CM | POA: Insufficient documentation

## 2013-01-27 DIAGNOSIS — Z8679 Personal history of other diseases of the circulatory system: Secondary | ICD-10-CM | POA: Insufficient documentation

## 2013-01-27 DIAGNOSIS — C8589 Other specified types of non-Hodgkin lymphoma, extranodal and solid organ sites: Secondary | ICD-10-CM | POA: Insufficient documentation

## 2013-01-27 DIAGNOSIS — H9319 Tinnitus, unspecified ear: Secondary | ICD-10-CM | POA: Insufficient documentation

## 2013-01-27 DIAGNOSIS — Z79899 Other long term (current) drug therapy: Secondary | ICD-10-CM | POA: Insufficient documentation

## 2013-01-27 DIAGNOSIS — Z9889 Other specified postprocedural states: Secondary | ICD-10-CM | POA: Insufficient documentation

## 2013-01-27 DIAGNOSIS — Z862 Personal history of diseases of the blood and blood-forming organs and certain disorders involving the immune mechanism: Secondary | ICD-10-CM | POA: Insufficient documentation

## 2013-01-27 DIAGNOSIS — Z8639 Personal history of other endocrine, nutritional and metabolic disease: Secondary | ICD-10-CM | POA: Insufficient documentation

## 2013-01-27 DIAGNOSIS — I2510 Atherosclerotic heart disease of native coronary artery without angina pectoris: Secondary | ICD-10-CM | POA: Insufficient documentation

## 2013-01-27 DIAGNOSIS — R42 Dizziness and giddiness: Secondary | ICD-10-CM | POA: Insufficient documentation

## 2013-01-27 DIAGNOSIS — Z7901 Long term (current) use of anticoagulants: Secondary | ICD-10-CM | POA: Insufficient documentation

## 2013-01-27 DIAGNOSIS — R5381 Other malaise: Secondary | ICD-10-CM | POA: Insufficient documentation

## 2013-01-27 DIAGNOSIS — K219 Gastro-esophageal reflux disease without esophagitis: Secondary | ICD-10-CM | POA: Insufficient documentation

## 2013-01-27 DIAGNOSIS — I10 Essential (primary) hypertension: Secondary | ICD-10-CM | POA: Insufficient documentation

## 2013-01-27 LAB — CBC
MCH: 32.4 pg (ref 26.0–34.0)
MCV: 88.1 fL (ref 78.0–100.0)
Platelets: 215 10*3/uL (ref 150–400)
RDW: 13.8 % (ref 11.5–15.5)

## 2013-01-27 LAB — BASIC METABOLIC PANEL
GFR calc Af Amer: >90 mL/min (ref >90)
GFR calc non Af Amer: 78 mL/min — ABNORMAL LOW (ref >90)
Potassium: 4.1 mEq/L (ref 3.5–5.1)
Sodium: 135 mEq/L (ref 135–145)

## 2013-01-27 LAB — POCT I-STAT TROPONIN I: Troponin i, poc: 0.01 ng/mL (ref 0.00–0.08)

## 2013-01-27 LAB — PRO B NATRIURETIC PEPTIDE: Pro B Natriuretic peptide (BNP): 158.7 pg/mL (ref 0–450)

## 2013-01-27 NOTE — ED Notes (Signed)
Charge nurse at bedside to interrogate pacer

## 2013-01-27 NOTE — Consult Note (Signed)
Reason for Consult: generator change 12/14/12 with recurrent palpitations Referring Physician: Dr. Bill Salinas [name] is an 79 y.o. female.  HPI: 78 yo woman with atrial flutter/fibrillation s/p AV node ablation and recent generator change (her third device) 02/14 for EOL who has had many palpitations. She's seen Dr. Odessa [name] office multiple times with small changes but she continues to notice her heart-rate/palpitations particularly once every night (she hasn't noted the time) and sometimes during the day, most recently at 15:xx today. She has no chest pain, no lightheadedness or syncope associated with her palpitations. She has not recorded the rate. She is mainly aware of her heart beating. She has no associated symptoms. She otherwise is doing well and has no issues with her other medications.   Past Medical History  Diagnosis Date  . Non Hodgkin's lymphoma     stable; followed by an oncologist  . Hyperlipidemia   . Hypertension   . Hereditary hemochromatosis     Phlebotomized in the past, not getting phlebotomy now  . GERD (gastroesophageal reflux disease)   . Complete heart block  s/p av ablation     with atrial fibrillation. Pt is s/p AV nodal ablation due to permanent and symptomatic atrial fibrillation.   . Coronary artery disease     Nonobstructive  . C. difficile colitis     feces transplant  . Atrial fibrillation 07/20/2009    Qualifier: Diagnosis of  By: Graciela Husbands, MD, [facility], [place]     Past Surgical History  Procedure Laterality Date  . Cardiac catheterization    . Abdominal hysterectomy    . Cholecystectomy    . Atrial ablation surgery  2008    AV nodal ablation  . US echocardiography  07/2008    EF 55%, mild LVH. No significant valvular abnormalities  . Colonoscopy  oct.12, 2012  . Stool transplant    . Cardioversion    . Insert / replace / remove pacemaker      Family History  Problem Relation Age of Onset  . Liver disease Father   . Cancer Mother      Social History:  reports that she has never smoked. She has never used smokeless tobacco. She reports that she does not drink alcohol or use illicit drugs.  Allergies:  Allergies  Allergen Reactions  . Amoxicillin   . Levofloxacin   . Plavix (Clopidogrel Bisulfate)     12/12/11 patient reports she has never taken Plavix before.     Medications: I have reviewed the patient's current medications. Prior to Admission:  (Not in a hospital admission) Scheduled:  Results for orders placed during the hospital encounter of 01/27/13 (from the past 48 hour(s))  CBC     Status: Abnormal   Collection Time    01/27/13  5:24 PM      Result Value Range   WBC 8.9  4.0 - 10.5 K/uL   RBC 4.47  3.87 - 5.11 MIL/uL   Hemoglobin 14.5  12.0 - 15.0 g/dL   HCT 40.9  81.1 - 91.4 %   MCV 88.1  78.0 - 100.0 fL   MCH 32.4  26.0 - 34.0 pg   MCHC 36.8 (*) 30.0 - 36.0 g/dL   RDW 78.2  95.6 - 21.3 %   Platelets 215  150 - 400 K/uL  BASIC METABOLIC PANEL     Status: Abnormal   Collection Time    01/27/13  5:24 PM      Result Value Range   Sodium  135  135 - 145 mEq/L   Potassium 4.1  3.5 - 5.1 mEq/L   Chloride 97  96 - 112 mEq/L   CO2 27  19 - 32 mEq/L   Glucose, Bld 213 (*) 70 - 99 mg/dL   BUN 10  6 - 23 mg/dL   Creatinine, Ser 1.61  0.50 - 1.10 mg/dL   Calcium 9.3  8.4 - 09.6 mg/dL   GFR calc non Af Amer 78 (*) >90 mL/min   GFR calc Af Amer >90  >90 mL/min   Comment:            The eGFR has been calculated     using the CKD EPI equation.     This calculation has not been     validated in all clinical     situations.     eGFR's persistently     <90 mL/min signify     possible Chronic Kidney Disease.  PRO B NATRIURETIC PEPTIDE     Status: None   Collection Time    01/27/13  5:24 PM      Result Value Range   Pro B Natriuretic peptide (BNP) 158.7  0 - 450 pg/mL  POCT I-STAT TROPONIN I     Status: None   Collection Time    01/27/13  5:41 PM      Result Value Range   Troponin i, poc 0.01   0.00 - 0.08 ng/mL   Comment 3            Comment: Due to the release kinetics of cTnI,     a negative result within the first hours     of the onset of symptoms does not rule out     myocardial infarction with certainty.     If myocardial infarction is still suspected,     repeat the test at appropriate intervals.    Dg Chest 2 View  01/27/2013  *RADIOLOGY REPORT*  Clinical Data: Short of breath  CHEST - 2 VIEW  Comparison: 02/19/2005  Findings: Dual lead right subclavian pacemaker is stable and intact.  Hyperaeration.  Chronic mild bronchitic changes.  Normal heart size.  No pneumothorax.  No pleural effusion.  Osteopenia without compression deformity in the visualized spine.  IMPRESSION: Changes related to COPD.  No active cardiopulmonary disease.   Original Report Authenticated By: Jolaine Click, M.D.     Review of Systems  Constitutional: Negative for fever, chills and weight loss.  HENT: Positive for tinnitus. Negative for hearing loss and neck pain.   Eyes: Negative for blurred vision, double vision and photophobia.  Respiratory: Negative for cough, hemoptysis and sputum production.   Cardiovascular: Positive for palpitations. Negative for chest pain, orthopnea, claudication and leg swelling.  Gastrointestinal: Negative for heartburn, nausea and vomiting.  Genitourinary: Negative for dysuria, urgency and frequency.  Musculoskeletal: Negative for myalgias and back pain.  Skin: Negative for itching and rash.  Neurological: Positive for dizziness. Negative for tingling, tremors, sensory change and headaches.       Chronic dizziness related to meniere's disease - no changes  Endo/Heme/Allergies: Negative for environmental allergies and polydipsia.  Psychiatric/Behavioral: Negative for depression, suicidal ideas and substance abuse.   Blood pressure 132/54, pulse 59, temperature 98.2 F (36.8 C), temperature source Oral, resp. rate 16, SpO2 97.00%. Physical Exam  Nursing note and  vitals reviewed. Constitutional: She is oriented to person, place, and time. She appears well-developed and well-nourished. No distress.  HENT:  Head: Normocephalic and atraumatic.  Nose: Nose normal.  Mouth/Throat: Oropharynx is clear and moist. No oropharyngeal exudate.  Eyes: Conjunctivae and EOM are normal. Pupils are equal, round, and reactive to light. No scleral icterus.  Neck: Normal range of motion. Neck supple. No JVD present. No tracheal deviation present. No thyromegaly present.  Cardiovascular: Normal rate, regular rhythm, normal heart sounds and intact distal pulses.  Exam reveals no gallop.   No murmur heard. Respiratory: Effort normal and breath sounds normal. No respiratory distress. She has no wheezes. She has no rales.  GI: Soft. Bowel sounds are normal. She exhibits no distension. There is no tenderness. There is no rebound.  Musculoskeletal: Normal range of motion. She exhibits no edema and no tenderness.  Neurological: She is alert and oriented to person, place, and time. She has normal reflexes. No cranial nerve deficit. Coordination normal.  Skin: Skin is warm and dry. No rash noted. She is not diaphoretic. No erythema.  Psychiatric: She has a normal mood and affect. Her behavior is normal.   Labs reviewed; wbc 8.9, h/h 14.5/39.9, plt 215, na 135, K 4.1, bun/cr 10/0.71 ECG reviewed: V-paced at 63s with underlying flutter BNP 159 ECG with right-sided device, leads in good position  Device Transmission: VVIR, lower rate 60/upper rate 110 Paced 100%; ~ 80% HR 60-70, ~20% HR 70-80, <2% HR 80-120 Threshold 0.875V @ 0.4 msec Sensitivity 5.6 mV Impedance 591 ohms No high rate ventricular episodes Capture Test at 02:00 daily Atrial high rate episodes: detect at 180 bpm; Ventricular high rate episodes > 150 bpm   Problem List Palpitations Atrial flutter with AV node ablation in past and recent generator change 12/14/12 Complete Heart Block with DC PPM at  VVI-60 Hypertension Dyslipidemia Long-term anticoagulation with last INR 2.3 in 02/14  Assessment/Plan: 79 yo woman with atrial flutter s/p AV node ablation s/p recent 12/14/12 generator change for EOL who has had several visits and device interrogations in the past 4-6 weeks for her symptoms of palpitations with upcoming appointment with Dr. Graciela Husbands on Tuesday, 01/29/13. Currently, she is asymptomatic, she's not lightheaded, has no syncope, no chest pain, no exercise intolerance. She had symptoms of palpitations at 15:xx today but mostly she is waking up feeling her heart beat in the middle of the night. She has not checked her pulse but she is not lightheaded or dizzy when she notices her palpitations. She's had several small changes to her PPM since generator change. On review of her device transmission > 95% of the time her HR is 60-180 with only a very small % of time 80-120 (histogram is hard to read below 1%). She is not lightheaded during these episodes. She may be having short salvos of faster rates but her device interrogation per my read is not picking any up. I suspect she is just noticing some PVCs or changes in rate. At night, she may be waking up when her auto-testing occurs at 02:00. Given her hemodynamic stability, stable device interrogation and appointment on 01/29/13 we devised a plan: if/when she has recurrent episodes, she will, #1) check for safety, if is has any symptoms of chest pain, dizziness/lightheadedness or pre-syncope she will call 911 or our operator. #2) She is check her pulse and record it. #3) she will write down the time of day of her palpitations. She may be a candidate for pill-in-a-pocket diltiazem or beta-blocker use but her HR histogram is very benign. She and her family helped me formulate the current plan and know that if any symptoms come  on or change that she should see medical attention immediately.  Jayline Kilburg 01/27/2013, 11:11 PM

## 2013-01-27 NOTE — ED Notes (Signed)
Pt comfortable with d/c and f/u instructions. 

## 2013-01-27 NOTE — ED Provider Notes (Signed)
History     CSN: [id number]  Arrival date & time 01/27/13  1715   None     Chief Complaint  Patient presents with  . Atrial Fibrillation    (Consider location/radiation/quality/duration/timing/severity/associated sxs/prior treatment) HPI Comments: Patient is an 79 year old woman who had a pacemaker generator change. Been having problems with palpitations in his head reprogrammed 3 different times since that change of pacemaker generator. The last time this was done was a week ago Thursday, about 10 days ago. She states that she has episodes of very rapid heartbeat, and thinks that his atrial fibrillation. She says that around her nose and kept her up all last night she says sometimes changing position will seem to bring this on.. Call or return to Theodore Demark, PA-C for a power cardiology who advised her to come to the  hospital.  Patient is a 79 y.o. female presenting with palpitations. The history is provided by the patient and medical records. No language interpreter was used.  Palpitations  This is a recurrent problem. The current episode started 12 to 24 hours ago. Episode frequency: Intermittent episodes of rapid heartbeat. The problem has not changed since onset.Associated with: Changes body position. Episode Length: Episodes lasting minutes to an hour. Associated symptoms include irregular heartbeat and weakness. Pertinent negatives include no fever and no chest pain. She has tried nothing for the symptoms. Past medical history comments: Recent change of pacemaker generator..    Past Medical History  Diagnosis Date  . Non Hodgkin's lymphoma     stable; followed by an oncologist  . Hyperlipidemia   . Hypertension   . Hereditary hemochromatosis     Phlebotomized in the past, not getting phlebotomy now  . GERD (gastroesophageal reflux disease)   . Complete heart block  s/p av ablation     with atrial fibrillation. Pt is s/p AV nodal ablation due to permanent and symptomatic  atrial fibrillation.   . Coronary artery disease     Nonobstructive  . C. difficile colitis     feces transplant  . Atrial fibrillation 07/20/2009    Qualifier: Diagnosis of  By: Graciela Husbands, MD, [facility], [place]     Past Surgical History  Procedure Laterality Date  . Cardiac catheterization    . Abdominal hysterectomy    . Cholecystectomy    . Atrial ablation surgery  2008    AV nodal ablation  . US echocardiography  07/2008    EF 55%, mild LVH. No significant valvular abnormalities  . Colonoscopy  oct.12, 2012  . Stool transplant    . Cardioversion    . Insert / replace / remove pacemaker      Family History  Problem Relation Age of Onset  . Liver disease Father   . Cancer Mother     History  Substance Use Topics  . Smoking status: Never Smoker   . Smokeless tobacco: Never Used  . Alcohol Use: No    OB History   Grav Para Term Preterm Abortions TAB SAB Ect Mult Living                  Review of Systems  Constitutional: Negative for fever and chills.  HENT: Negative.   Eyes: Negative.   Respiratory: Negative.   Cardiovascular: Positive for palpitations. Negative for chest pain.       She thinks she is having episodes of rapid atrial fibrillation.  Gastrointestinal: Negative.   Genitourinary: Negative.   Musculoskeletal: Negative.   Skin: Negative.  Neurological: Positive for weakness.  Psychiatric/Behavioral: Negative.     Allergies  Amoxicillin; Levofloxacin; and Plavix  Home Medications   Current Outpatient Rx  Name  Route  Sig  Dispense  Refill  . alendronate (FOSAMAX) 70 MG tablet   Oral   Take 70 mg by mouth every 7 (seven) days. Takes on Tuesdays. Take with a full glass of water on an empty stomach.         . ALPRAZolam (XANAX) 0.25 MG tablet   Oral   Take 0.25 mg by mouth at bedtime as needed for sleep.          [name] amLODipine (NORVASC) 5 MG tablet   Oral   Take 5 mg by mouth daily.           . Cetirizine HCl (ZYRTEC ALLERGY PO)    Oral   Take by mouth daily.          . fluticasone (FLONASE) 50 MCG/ACT nasal spray   Nasal   Place 2 sprays into the nose daily as needed for allergies.          . folic acid (FOLVITE) 1 MG tablet   Oral   Take 1 mg by mouth daily.           [name] gabapentin (NEURONTIN) 400 MG capsule   Oral   Take 400 mg by mouth daily.         . Pantoprazole Sodium (PROTONIX PO)   Oral   Take 40 mg by mouth daily.         . polyvinyl alcohol (LIQUIFILM TEARS) 1.4 % ophthalmic solution   Both Eyes   Place 2 drops into both eyes 3 (three) times daily as needed (for dry eyes).         . warfarin (COUMADIN) 2 MG tablet   Oral   Take 2 mg by mouth every Tuesday, Thursday, Saturday, and Sunday at 6 PM.         . warfarin (COUMADIN) 3 MG tablet   Oral   Take 3 mg by mouth every Monday, Wednesday, and Friday at 6 PM.           BP 160/54  Pulse 63  Temp(Src) 98.2 F (36.8 C) (Oral)  Resp 18  SpO2 95%  Physical Exam  Nursing note and vitals reviewed. Constitutional: She is oriented to person, place, and time.  Pleasant elderly woman in no distress at rest.  HENT:  Head: Normocephalic and atraumatic.  Right Ear: External ear normal.  Left Ear: External ear normal.  Mouth/Throat: Oropharynx is clear and moist.  Eyes: Conjunctivae and EOM are normal. No scleral icterus.  Neck: Normal range of motion.  Cardiovascular: Normal rate, regular rhythm and normal heart sounds.   Rate is 60 with pacer beats with each QRS as seen on the monitor.  Pulmonary/Chest: Effort normal and breath sounds normal.  Abdominal: Soft. Bowel sounds are normal.  Musculoskeletal: Normal range of motion. She exhibits no edema and no tenderness.  Neurological: She is alert and oriented to person, place, and time.  No sensory or motor deficit.  Skin: Skin is warm and dry.  Psychiatric: She has a normal mood and affect. Her behavior is normal.    ED Course  Procedures (including critical care  time)  Labs Reviewed  BASIC METABOLIC PANEL - Abnormal; Notable for the following:    Glucose, Bld 213 (*)    GFR calc non Af Amer 78 (*)    All  other components within normal limits  PRO B NATRIURETIC PEPTIDE  CBC  POCT I-STAT TROPONIN I   Dg Chest 2 View  01/27/2013  *RADIOLOGY REPORT*  Clinical Data: Short of breath  CHEST - 2 VIEW  Comparison: 02/19/2005  Findings: Dual lead right subclavian pacemaker is stable and intact.  Hyperaeration.  Chronic mild bronchitic changes.  Normal heart size.  No pneumothorax.  No pleural effusion.  Osteopenia without compression deformity in the visualized spine.  IMPRESSION: Changes related to COPD.  No active cardiopulmonary disease.   Original Report Authenticated By: Jolaine Click, M.D.    6:50 PM  Date: 01/27/2013  Rate: 60  Rhythm: Electronically paced rhythm.  QRS Axis: left  Intervals: normal  ST/T Wave abnormalities: normal  Conduction Disutrbances:left bundle branch block from paced beat.  Narrative Interpretation: Abnormal EKG  Old EKG Reviewed: changes noted--tracing on December 17, 2012 showed atrial flutter with rate of 175 with a paced rhyrhm.of 60.  7:42 PM Pt was seen and had physical exam.  EKG shows paced rhythm of 60.  I requested interrogation of her pacemaker.  8:47 PM Pt's pacemaker has been interrogated.  Call to Dr. Leeann Must, cardiologist on call, who will be down to see her.  9:44 PM Pt seen by Dr. Tresa Endo who advised that it is safe for her to go home.  She has planned visit with Dr. Graciela Husbands in 2 days.  1. Palpitations            Carleene Cooper III, MD 01/27/13 2145

## 2013-01-27 NOTE — Telephone Encounter (Signed)
Pt went back into atrial fib. She is very SOB with this, feels weak and her heart rate is not controlled (she doesn't think). The palpitations are waking her at night. She feels she cannot wait till Tuesday (has appt with SK).  Advised pt if she needs to be seen, she should come to the closest hospital but pt refuses. Says she will come to [facility]. Advised her EMS would be safest but she refused this also. She will get family to bring her.

## 2013-01-27 NOTE — ED Notes (Signed)
Dr. Davidson at bedside.

## 2013-01-27 NOTE — Telephone Encounter (Signed)
Mostly she is in afib.  We should ask her to make a log of when she "thinks " she is in afib or not, we can then see what her symptoms correlate with thanks

## 2013-01-27 NOTE — ED Notes (Addendum)
Pt reports recently having pacemaker replaced and having problems with frequent atrial fib, recently had it reprogrammed but still in afib x 1 week, reports mild sob, having palpitations but no cp. No resp distress noted at triage, ekg being done.

## 2013-01-28 NOTE — Telephone Encounter (Signed)
Pt called back 3/28 She wanted to see you this week Only availability was tomm in [place] with you Is this ok or do you want me to manage via telephone?

## 2013-01-29 ENCOUNTER — Ambulatory Visit (INDEPENDENT_AMBULATORY_CARE_PROVIDER_SITE_OTHER): Payer: Medicare Other | Admitting: Internal Medicine

## 2013-01-29 ENCOUNTER — Encounter: Payer: Self-pay | Admitting: Internal Medicine

## 2013-01-29 VITALS — BP 138/60 | HR 65 | Ht 62.0 in | Wt 125.8 lb

## 2013-01-29 DIAGNOSIS — R002 Palpitations: Secondary | ICD-10-CM

## 2013-01-29 LAB — PACEMAKER DEVICE OBSERVATION
BMOD-0005RV: 90 {beats}/min
BRDY-0004RV: 130 {beats}/min
RV LEAD IMPEDENCE PM: 600 Ohm

## 2013-01-29 NOTE — Assessment & Plan Note (Signed)
We have reprogrammed rate response to make it both less sensitive at threshold, less aggressvie on slope but higher target rate

## 2013-01-29 NOTE — Telephone Encounter (Signed)
Patient seen today

## 2013-01-29 NOTE — Progress Notes (Signed)
Patient Care Team: Daniel Nones as PCP - General (Internal Medicine)   HPI  [name] is a 79 y.o. female S/p pacemaker and AV abaltion for uncontrolled VR  Underwent device generator replacement 2/14 and device was programmed VVI without rate response    She is complaining of recurrent atrial fibrillation  That is to say palpitations with some amounts of motioin   Cath [facility] 95 and myoview neg 2001`  Past Medical History  Diagnosis Date  . Non Hodgkin's lymphoma     stable; followed by an oncologist  . Hyperlipidemia   . Hypertension   . Hereditary hemochromatosis     Phlebotomized in the past, not getting phlebotomy now  . GERD (gastroesophageal reflux disease)   . Complete heart block  s/p av ablation     with atrial fibrillation. Pt is s/p AV nodal ablation due to permanent and symptomatic atrial fibrillation.   . Coronary artery disease     Nonobstructive  . C. difficile colitis     feces transplant  . Atrial fibrillation 07/20/2009    Qualifier: Diagnosis of  By: Graciela Husbands, MD, [facility], [place]     Past Surgical History  Procedure Laterality Date  . Cardiac catheterization    . Abdominal hysterectomy    . Cholecystectomy    . Atrial ablation surgery  2008    AV nodal ablation  . US echocardiography  07/2008    EF 55%, mild LVH. No significant valvular abnormalities  . Colonoscopy  oct.12, 2012  . Stool transplant    . Cardioversion    . Insert / replace / remove pacemaker      Current Outpatient Prescriptions  Medication Sig Dispense Refill  . alendronate (FOSAMAX) 70 MG tablet Take 70 mg by mouth every 7 (seven) days. Takes on Tuesdays. Take with a full glass of water on an empty stomach.      . ALPRAZolam (XANAX) 0.25 MG tablet Take 0.5 mg by mouth at bedtime as needed for sleep.      [name] amLODipine (NORVASC) 5 MG tablet Take 5 mg by mouth daily.        . Cetirizine HCl (ZYRTEC ALLERGY PO) Take by mouth daily.       . fluticasone (FLONASE) 50 MCG/ACT nasal  spray Place 2 sprays into the nose daily as needed for allergies.       . folic acid (FOLVITE) 1 MG tablet Take 1 mg by mouth daily.        [name] gabapentin (NEURONTIN) 400 MG capsule Take 400 mg by mouth daily.      . Pantoprazole Sodium (PROTONIX PO) Take 40 mg by mouth daily.      . polyvinyl alcohol (LIQUIFILM TEARS) 1.4 % ophthalmic solution Place 2 drops into both eyes 3 (three) times daily as needed (for dry eyes).      . warfarin (COUMADIN) 2 MG tablet Take 2 mg by mouth every Tuesday, Thursday, Saturday, and Sunday at 6 PM.      . warfarin (COUMADIN) 3 MG tablet Take 3 mg by mouth every Monday, Wednesday, and Friday at 6 PM.       No current facility-administered medications for this visit.    Allergies  Allergen Reactions  . Amoxicillin   . Levofloxacin   . Plavix (Clopidogrel Bisulfate)     12/12/11 patient reports she has never taken Plavix before.     Review of Systems negative except from HPI and PMH  Physical Exam BP 138/60  Pulse  65  Ht 5\' 2"  (1.575 m)  Wt 125 lb 12.8 oz (57.063 kg)  BMI 23 kg/m2  SpO2 95% Well developed and well nourished in no acute distress HENT normal E scleral and icterus clear Neck Supple JVP flat; carotids brisk and full Clear to ausculation Regular rate and rhythm, no murmurs gallops or rub Soft with active bowel sounds No clubbing cyanosis none Edema Alert and oriented, grossly normal motor and sensory function Skin Warm and Dry  ECG: afl  With complete heart block and ventricular pacing            Assessment and  Plan

## 2013-01-29 NOTE — Patient Instructions (Signed)
Your physician wants you to follow-up in: August 2014 in [place] with Dr. Graciela Husbands. You will receive a reminder letter in the mail two months in advance. If you don't receive a letter, please call our office to schedule the follow-up appointment.  Your physician recommends that you continue on your current medications as directed. Please refer to the Current Medication list given to you today.

## 2013-01-30 ENCOUNTER — Ambulatory Visit (INDEPENDENT_AMBULATORY_CARE_PROVIDER_SITE_OTHER): Payer: Medicare Other

## 2013-01-30 DIAGNOSIS — I4891 Unspecified atrial fibrillation: Secondary | ICD-10-CM

## 2013-01-30 DIAGNOSIS — Z7901 Long term (current) use of anticoagulants: Secondary | ICD-10-CM

## 2013-03-13 ENCOUNTER — Ambulatory Visit (INDEPENDENT_AMBULATORY_CARE_PROVIDER_SITE_OTHER): Payer: Medicare Other

## 2013-03-13 DIAGNOSIS — Z7901 Long term (current) use of anticoagulants: Secondary | ICD-10-CM

## 2013-03-13 DIAGNOSIS — I4891 Unspecified atrial fibrillation: Secondary | ICD-10-CM

## 2013-04-08 ENCOUNTER — Other Ambulatory Visit: Payer: Self-pay

## 2013-04-08 ENCOUNTER — Other Ambulatory Visit: Payer: Self-pay | Admitting: *Deleted

## 2013-04-08 MED ORDER — WARFARIN SODIUM 2 MG PO TABS: 2.0000 mg | ORAL_TABLET | ORAL | Status: DC

## 2013-04-08 MED ORDER — AMLODIPINE BESYLATE 5 MG PO TABS: 5.0000 mg | ORAL_TABLET | Freq: Every day | ORAL | Status: DC

## 2013-04-08 NOTE — Telephone Encounter (Signed)
Refill sent for amlodipine.  

## 2013-04-24 ENCOUNTER — Ambulatory Visit (INDEPENDENT_AMBULATORY_CARE_PROVIDER_SITE_OTHER): Payer: Medicare Other

## 2013-04-24 DIAGNOSIS — I4891 Unspecified atrial fibrillation: Secondary | ICD-10-CM

## 2013-04-24 DIAGNOSIS — Z7901 Long term (current) use of anticoagulants: Secondary | ICD-10-CM

## 2013-04-24 LAB — POCT INR: INR: 2.8

## 2013-05-06 ENCOUNTER — Other Ambulatory Visit: Payer: Self-pay | Admitting: Internal Medicine

## 2013-05-06 ENCOUNTER — Other Ambulatory Visit: Payer: Self-pay | Admitting: *Deleted

## 2013-05-06 MED ORDER — WARFARIN SODIUM 3 MG PO TABS: ORAL_TABLET | ORAL | Status: DC

## 2013-05-14 ENCOUNTER — Ambulatory Visit: Payer: Self-pay | Admitting: Internal Medicine

## 2013-05-18 LAB — AFP TUMOR MARKER: AFP-Tumor Marker: 4.1 ng/mL (ref 0.0–8.3)

## 2013-05-31 ENCOUNTER — Ambulatory Visit: Payer: Self-pay | Admitting: Internal Medicine

## 2013-06-05 ENCOUNTER — Ambulatory Visit (INDEPENDENT_AMBULATORY_CARE_PROVIDER_SITE_OTHER): Payer: Medicare Other

## 2013-06-05 DIAGNOSIS — Z7901 Long term (current) use of anticoagulants: Secondary | ICD-10-CM

## 2013-06-05 DIAGNOSIS — I4891 Unspecified atrial fibrillation: Secondary | ICD-10-CM

## 2013-06-05 LAB — POCT INR: INR: 2.5

## 2013-06-20 ENCOUNTER — Ambulatory Visit (INDEPENDENT_AMBULATORY_CARE_PROVIDER_SITE_OTHER): Payer: Medicare Other | Admitting: Internal Medicine

## 2013-06-20 ENCOUNTER — Encounter: Payer: Self-pay | Admitting: Internal Medicine

## 2013-06-20 VITALS — BP 145/86 | HR 62 | Ht 62.0 in | Wt 125.5 lb

## 2013-06-20 DIAGNOSIS — I4891 Unspecified atrial fibrillation: Secondary | ICD-10-CM

## 2013-06-20 DIAGNOSIS — I10 Essential (primary) hypertension: Secondary | ICD-10-CM

## 2013-06-20 DIAGNOSIS — R1900 Intra-abdominal and pelvic swelling, mass and lump, unspecified site: Secondary | ICD-10-CM | POA: Insufficient documentation

## 2013-06-20 DIAGNOSIS — I442 Atrioventricular block, complete: Secondary | ICD-10-CM

## 2013-06-20 DIAGNOSIS — Z950 Presence of cardiac pacemaker: Secondary | ICD-10-CM

## 2013-06-20 DIAGNOSIS — I493 Ventricular premature depolarization: Secondary | ICD-10-CM

## 2013-06-20 DIAGNOSIS — I4949 Other premature depolarization: Secondary | ICD-10-CM

## 2013-06-20 LAB — PACEMAKER DEVICE OBSERVATION
BMOD-0001RV: HIGH
BRDY-0002RV: 60 {beats}/min
BRDY-0004RV: 130 {beats}/min
RV LEAD IMPEDENCE PM: 686 Ohm
RV LEAD THRESHOLD: 0.75 V

## 2013-06-20 NOTE — Assessment & Plan Note (Signed)
stable °

## 2013-06-20 NOTE — Patient Instructions (Addendum)
[facility] Transmission 09/23/2013. Your physician wants you to follow-up in:12 months You will receive a reminder letter in the mail two months in advance. If you don't receive a letter, please call our office to schedule the follow-up appointment.  Abdominal ultrasound. Do not eat or drink 3 hours prior to exam.

## 2013-06-20 NOTE — Assessment & Plan Note (Signed)
Permanent on anticoagulation 

## 2013-06-20 NOTE — Assessment & Plan Note (Signed)
The patient's device was interrogated.  The information was reviewed. No changes were made in the programming.    

## 2013-06-20 NOTE — Progress Notes (Signed)
Patient Care Team: Curtis Sites III as PCP - General (Internal Medicine)   HPI  [name] is a 79 y.o. female With a history of permanent atrial fibrillation status post AV junction ablation for uncontrolled ventricular response this subsequently implanted pacemaker. She underwent generator replacement February 2014 which was inadvertently associated with failure to activate rate response. This was subsequently activated: Some degree of overresponsiveness causing her to have a sensation of tachypalpitations which was addressed by reprogramming. She's feeling much betteri  She also has noted abd pulsations of late  There are no assoc back pain   Past Medical History  Diagnosis Date  . Non Hodgkin's lymphoma     stable; followed by an oncologist  . Hyperlipidemia   . Hypertension   . Hereditary hemochromatosis     Phlebotomized in the past, not getting phlebotomy now  . GERD (gastroesophageal reflux disease)   . Complete heart block  s/p av ablation     with atrial fibrillation. Pt is s/p AV nodal ablation due to permanent and symptomatic atrial fibrillation.   . Coronary artery disease     Nonobstructive  . C. difficile colitis     feces transplant  . Atrial fibrillation 07/20/2009    Qualifier: Diagnosis of  By: Graciela Husbands, MD, [facility], Ty Hilts     Past Surgical History  Procedure Laterality Date  . Cardiac catheterization    . Abdominal hysterectomy    . Cholecystectomy    . Atrial ablation surgery  2008    AV nodal ablation  . US echocardiography  07/2008    EF 55%, mild LVH. No significant valvular abnormalities  . Colonoscopy  oct.12, 2012  . Stool transplant    . Cardioversion    . Insert / replace / remove pacemaker      Current Outpatient Prescriptions  Medication Sig Dispense Refill  . alendronate (FOSAMAX) 70 MG tablet Take 70 mg by mouth every 7 (seven) days. Takes on Tuesdays. Take with a full glass of water on an empty stomach.      . ALPRAZolam (XANAX)  0.25 MG tablet Take 0.5 mg by mouth at bedtime as needed for sleep.      [name] amLODipine (NORVASC) 5 MG tablet Take 1 tablet (5 mg total) by mouth daily.  30 tablet  6  . Cetirizine HCl (ZYRTEC ALLERGY PO) Take by mouth daily.       . fluticasone (FLONASE) 50 MCG/ACT nasal spray Place 2 sprays into the nose daily as needed for allergies.       . folic acid (FOLVITE) 1 MG tablet Take 1 mg by mouth daily.        [name] gabapentin (NEURONTIN) 400 MG capsule Take 400 mg by mouth daily.      . Pantoprazole Sodium (PROTONIX PO) Take 40 mg by mouth daily.      . polyvinyl alcohol (LIQUIFILM TEARS) 1.4 % ophthalmic solution Place 2 drops into both eyes 3 (three) times daily as needed (for dry eyes).      . warfarin (COUMADIN) 2 MG tablet Take 1 tablet (2 mg total) by mouth as directed.  30 tablet  3  . warfarin (COUMADIN) 3 MG tablet Take as directed by coumadin clinic  20 tablet  1   No current facility-administered medications for this visit.    Allergies  Allergen Reactions  . Amoxicillin   . Levofloxacin   . Plavix [Clopidogrel Bisulfate]     12/12/11 patient reports she has never taken Plavix  before.     Review of Systems negative except from HPI and PMH  Physical Exam BP 145/86  Pulse 62  Ht 5\' 2"  (1.575 m)  Wt 125 lb 8 oz (56.926 kg)  BMI 22.95 kg/m2 Well developed and well nourished in no acute distress HENT normal E scleral and icterus clear Neck Supple JVP flat; carotids brisk and full Clear to ausculation  Regular rate and rhythm, no murmurs gallops or rub Soft with active bowel sounds broad abd pulsation No clubbing cyanosis none Edema Alert and oriented, grossly normal motor and sensory function Skin Warm and Dry    Assessment and  Plan

## 2013-06-20 NOTE — Assessment & Plan Note (Signed)
Mid abd>> ultra sound

## 2013-06-27 ENCOUNTER — Encounter: Payer: Medicare Other | Admitting: Internal Medicine

## 2013-07-16 ENCOUNTER — Ambulatory Visit (INDEPENDENT_AMBULATORY_CARE_PROVIDER_SITE_OTHER): Payer: Medicare Other

## 2013-07-16 DIAGNOSIS — I4891 Unspecified atrial fibrillation: Secondary | ICD-10-CM

## 2013-07-16 DIAGNOSIS — Z7901 Long term (current) use of anticoagulants: Secondary | ICD-10-CM

## 2013-07-16 LAB — POCT INR: INR: 4.2

## 2013-08-07 ENCOUNTER — Ambulatory Visit: Payer: Self-pay | Admitting: Physician Assistant

## 2013-08-07 ENCOUNTER — Ambulatory Visit (INDEPENDENT_AMBULATORY_CARE_PROVIDER_SITE_OTHER): Payer: Medicare Other | Admitting: General Practice

## 2013-08-07 DIAGNOSIS — I4891 Unspecified atrial fibrillation: Secondary | ICD-10-CM

## 2013-08-07 DIAGNOSIS — Z7901 Long term (current) use of anticoagulants: Secondary | ICD-10-CM

## 2013-08-07 LAB — POCT INR: INR: 2.6

## 2013-08-12 ENCOUNTER — Ambulatory Visit (INDEPENDENT_AMBULATORY_CARE_PROVIDER_SITE_OTHER): Payer: Medicare Other | Admitting: Pharmacist

## 2013-08-12 DIAGNOSIS — I4891 Unspecified atrial fibrillation: Secondary | ICD-10-CM

## 2013-08-12 DIAGNOSIS — Z7901 Long term (current) use of anticoagulants: Secondary | ICD-10-CM

## 2013-09-04 ENCOUNTER — Ambulatory Visit (INDEPENDENT_AMBULATORY_CARE_PROVIDER_SITE_OTHER): Payer: Medicare Other | Admitting: General Practice

## 2013-09-04 DIAGNOSIS — Z7901 Long term (current) use of anticoagulants: Secondary | ICD-10-CM

## 2013-09-04 DIAGNOSIS — I4891 Unspecified atrial fibrillation: Secondary | ICD-10-CM

## 2013-09-04 LAB — POCT INR: INR: 3.7

## 2013-09-23 ENCOUNTER — Encounter: Payer: Medicare Other | Admitting: *Deleted

## 2013-09-25 ENCOUNTER — Ambulatory Visit (INDEPENDENT_AMBULATORY_CARE_PROVIDER_SITE_OTHER): Payer: Medicare Other | Admitting: General Practice

## 2013-09-25 ENCOUNTER — Telehealth: Payer: Self-pay

## 2013-09-25 DIAGNOSIS — I4891 Unspecified atrial fibrillation: Secondary | ICD-10-CM

## 2013-09-25 DIAGNOSIS — Z7901 Long term (current) use of anticoagulants: Secondary | ICD-10-CM

## 2013-09-25 LAB — POCT INR: INR: 2.4

## 2013-09-25 NOTE — Telephone Encounter (Signed)
Sending pt filter to see if that will send transmission/kwm

## 2013-09-25 NOTE — Telephone Encounter (Signed)
Pt reports tried to do remote telephone transmission yesterday.  Pt was able to do and got confirmation beep, but when transmitting to phone yellow light came on and would not complete transmission.  Pt has a Medtronic device and states has card to call for problems, but no telephone # on card.  Attempted to trouble shoot herself at the house, but was unable to get remote device to work.  Pt would like to speak to someone about.  Please return call to pt.

## 2013-10-03 ENCOUNTER — Encounter: Payer: Self-pay | Admitting: *Deleted

## 2013-10-07 ENCOUNTER — Other Ambulatory Visit: Payer: Self-pay

## 2013-10-07 MED ORDER — WARFARIN SODIUM 3 MG PO TABS: ORAL_TABLET | ORAL | Status: DC

## 2013-10-07 NOTE — Telephone Encounter (Signed)
Please review and refill the warfarin 3 mg.

## 2013-10-10 ENCOUNTER — Telehealth: Payer: Self-pay | Admitting: Internal Medicine

## 2013-10-10 NOTE — Telephone Encounter (Signed)
Pt has yellow light during send portion of a home transmission. Pt has [employer] Sheliah Hatch phone svc; also received DSL filter in mail a few days ago but has not connected it. Pt reluctant to attempt to trouble shoot on phone; pt is switching to [employer] in a few days. She plans to transmit once phone svc. changes.

## 2013-10-10 NOTE — Telephone Encounter (Signed)
New message    Want device clinic to know that she has been trying to send her pacermaker ck remotely but she is having trouble.  She thinks it is her phone.  She will keep trying.

## 2013-10-30 ENCOUNTER — Ambulatory Visit (INDEPENDENT_AMBULATORY_CARE_PROVIDER_SITE_OTHER): Payer: Medicare Other

## 2013-10-30 DIAGNOSIS — I4891 Unspecified atrial fibrillation: Secondary | ICD-10-CM

## 2013-10-30 DIAGNOSIS — Z7901 Long term (current) use of anticoagulants: Secondary | ICD-10-CM

## 2013-11-12 ENCOUNTER — Telehealth: Payer: Self-pay | Admitting: *Deleted

## 2013-11-12 NOTE — Telephone Encounter (Signed)
Please call [name] she is still struggling with her device at home. thanks

## 2013-11-20 NOTE — Telephone Encounter (Signed)
Patient instructed to call Medtronic [facility] 800# for troubleshooting.

## 2013-11-22 ENCOUNTER — Ambulatory Visit (INDEPENDENT_AMBULATORY_CARE_PROVIDER_SITE_OTHER): Payer: Medicare Other | Admitting: *Deleted

## 2013-11-22 DIAGNOSIS — I4891 Unspecified atrial fibrillation: Secondary | ICD-10-CM

## 2013-11-22 DIAGNOSIS — I442 Atrioventricular block, complete: Secondary | ICD-10-CM

## 2013-11-22 LAB — MDC_IDC_ENUM_SESS_TYPE_REMOTE
Lead Channel Impedance Value: 585 Ohm
Lead Channel Impedance Value: 67 Ohm
Lead Channel Setting Pacing Amplitude: 2.5 V
Lead Channel Setting Pacing Pulse Width: 0.4 ms
Lead Channel Setting Sensing Sensitivity: 5.6 mV
MDC IDC MSMT BATTERY IMPEDANCE: 111 Ohm
MDC IDC MSMT BATTERY VOLTAGE: 2.8 V
MDC IDC SESS DTM: 20150122193834
MDC IDC STAT BRADY RV PERCENT PACED: 100 %

## 2013-11-27 ENCOUNTER — Ambulatory Visit (INDEPENDENT_AMBULATORY_CARE_PROVIDER_SITE_OTHER): Payer: Medicare Other

## 2013-11-27 DIAGNOSIS — Z7901 Long term (current) use of anticoagulants: Secondary | ICD-10-CM

## 2013-11-27 DIAGNOSIS — I4891 Unspecified atrial fibrillation: Secondary | ICD-10-CM

## 2013-11-27 DIAGNOSIS — Z5181 Encounter for therapeutic drug level monitoring: Secondary | ICD-10-CM

## 2013-11-27 LAB — POCT INR: INR: 2.1

## 2013-12-03 ENCOUNTER — Other Ambulatory Visit: Payer: Self-pay

## 2013-12-03 MED ORDER — WARFARIN SODIUM 2 MG PO TABS: 2.0000 mg | ORAL_TABLET | ORAL | Status: DC

## 2013-12-03 NOTE — Telephone Encounter (Signed)
Please review for refill. Thanks!  

## 2013-12-10 ENCOUNTER — Encounter: Payer: Self-pay | Admitting: *Deleted

## 2013-12-19 ENCOUNTER — Encounter: Payer: Self-pay | Admitting: Internal Medicine

## 2013-12-30 ENCOUNTER — Other Ambulatory Visit: Payer: Self-pay | Admitting: Cardiovascular Disease

## 2013-12-30 NOTE — Telephone Encounter (Signed)
Please review for refill. Thanks!  

## 2013-12-31 NOTE — Telephone Encounter (Signed)
Refill request for warfarin 

## 2014-01-08 ENCOUNTER — Ambulatory Visit (INDEPENDENT_AMBULATORY_CARE_PROVIDER_SITE_OTHER): Payer: Medicare Other

## 2014-01-08 DIAGNOSIS — Z5181 Encounter for therapeutic drug level monitoring: Secondary | ICD-10-CM

## 2014-01-08 DIAGNOSIS — Z7901 Long term (current) use of anticoagulants: Secondary | ICD-10-CM

## 2014-01-08 DIAGNOSIS — I4891 Unspecified atrial fibrillation: Secondary | ICD-10-CM

## 2014-01-08 LAB — POCT INR: INR: 2.7

## 2014-01-24 ENCOUNTER — Encounter: Payer: Self-pay | Admitting: Internal Medicine

## 2014-01-24 ENCOUNTER — Telehealth: Payer: Self-pay | Admitting: Internal Medicine

## 2014-01-24 ENCOUNTER — Ambulatory Visit (INDEPENDENT_AMBULATORY_CARE_PROVIDER_SITE_OTHER): Payer: Medicare Other | Admitting: Internal Medicine

## 2014-01-24 VITALS — BP 137/71 | HR 66 | Ht 62.0 in | Wt 129.0 lb

## 2014-01-24 DIAGNOSIS — I97190 Other postprocedural cardiac functional disturbances following cardiac surgery: Secondary | ICD-10-CM

## 2014-01-24 DIAGNOSIS — Z950 Presence of cardiac pacemaker: Secondary | ICD-10-CM

## 2014-01-24 DIAGNOSIS — I442 Atrioventricular block, complete: Secondary | ICD-10-CM

## 2014-01-24 DIAGNOSIS — I4891 Unspecified atrial fibrillation: Secondary | ICD-10-CM

## 2014-01-24 DIAGNOSIS — R0602 Shortness of breath: Secondary | ICD-10-CM

## 2014-01-24 DIAGNOSIS — I97110 Postprocedural cardiac insufficiency following cardiac surgery: Secondary | ICD-10-CM | POA: Insufficient documentation

## 2014-01-24 HISTORY — DX: Other postprocedural cardiac functional disturbances following cardiac surgery: I97.190

## 2014-01-24 LAB — MDC_IDC_ENUM_SESS_TYPE_INCLINIC
Battery Voltage: 2.8 V
Brady Statistic AP VP Percent: 0 %
Brady Statistic AS VP Percent: 0 %
Brady Statistic AS VS Percent: 0 %
Brady Statistic RV Percent Paced: 99 %
Date Time Interrogation Session: 20150327195353
Lead Channel Impedance Value: 67 Ohm
Lead Channel Pacing Threshold Amplitude: 0.75 V
Lead Channel Sensing Intrinsic Amplitude: 11.2 mV
Lead Channel Sensing Intrinsic Amplitude: 2.8 mV
MDC IDC MSMT BATTERY IMPEDANCE: 134 Ohm
MDC IDC MSMT BATTERY REMAINING LONGEVITY: 142 mo
MDC IDC MSMT LEADCHNL RV IMPEDANCE VALUE: 594 Ohm
MDC IDC MSMT LEADCHNL RV PACING THRESHOLD PULSEWIDTH: 0.4 ms
MDC IDC SET LEADCHNL RA PACING AMPLITUDE: 2.5 V
MDC IDC SET LEADCHNL RV PACING AMPLITUDE: 2.5 V
MDC IDC SET LEADCHNL RV PACING PULSEWIDTH: 0.4 ms
MDC IDC SET LEADCHNL RV SENSING SENSITIVITY: 5.6 mV
MDC IDC STAT BRADY AP VS PERCENT: 0 %

## 2014-01-24 NOTE — Telephone Encounter (Signed)
New problem   Pt is having SOB and rapid heart beat.

## 2014-01-24 NOTE — Progress Notes (Signed)
Patient Care Team: Tama High III as PCP - General (Internal Medicine)   HPI  [name] is a 80 y.o. female With a history of permanent atrial fibrillation status post AV junction ablation for uncontrolled ventricular response this subsequently implanted pacemaker. She underwent generator replacement February 2014 which was inadvertently associated with failure to activate rate response. This was subsequently activated: Some degree of overresponsiveness causing her to have a sensation of tachypalpitations which was addressed by reprogramming.  She comes in with complaints of the last 3 weeks of palpitations dyspnea and lightheadedness.   Past Medical History  Diagnosis Date  . Non Hodgkin's lymphoma     stable; followed by an oncologist  . Hyperlipidemia   . Hypertension   . Hereditary hemochromatosis     Phlebotomized in the past, not getting phlebotomy now  . GERD (gastroesophageal reflux disease)   . Complete heart block  s/p av ablation     with atrial fibrillation. Pt is s/p AV nodal ablation due to permanent and symptomatic atrial fibrillation.   . Coronary artery disease     Nonobstructive  . C. difficile colitis     feces transplant  . Atrial fibrillation 07/20/2009    Qualifier: Diagnosis of  By: Caryl Comes, MD, [facility] Guise   . Pacemaker-dual-chamber-Medtronic 04/04/2011    Past Surgical History  Procedure Laterality Date  . Cardiac catheterization    . Abdominal hysterectomy    . Cholecystectomy    . Atrial ablation surgery  2008    AV nodal ablation  . US echocardiography  07/2008    EF 55%, mild LVH. No significant valvular abnormalities  . Colonoscopy  oct.12, 2012  . Stool transplant    . Cardioversion    . Insert / replace / remove pacemaker      Current Outpatient Prescriptions  Medication Sig Dispense Refill  . alendronate (FOSAMAX) 70 MG tablet Take 70 mg by mouth every 7 (seven) days. Takes on Tuesdays. Take with a full glass of water  on an empty stomach.      . ALPRAZolam (XANAX) 0.25 MG tablet Take 0.5 mg by mouth at bedtime as needed for sleep.      [name] amLODipine (NORVASC) 5 MG tablet Take 1 tablet (5 mg total) by mouth daily.  30 tablet  6  . Cetirizine HCl (ZYRTEC ALLERGY PO) Take by mouth daily.       . fluticasone (FLONASE) 50 MCG/ACT nasal spray Place 2 sprays into the nose daily as needed for allergies.       . folic acid (FOLVITE) 1 MG tablet Take 1 mg by mouth daily.        [name] gabapentin (NEURONTIN) 400 MG capsule Take 400 mg by mouth daily.      . Pantoprazole Sodium (PROTONIX PO) Take 40 mg by mouth daily.      . polyvinyl alcohol (LIQUIFILM TEARS) 1.4 % ophthalmic solution Place 2 drops into both eyes 3 (three) times daily as needed (for dry eyes).      . warfarin (COUMADIN) 2 MG tablet Take 1 tablet (2 mg total) by mouth as directed.  30 tablet  3  . warfarin (COUMADIN) 3 MG tablet TAKE AS DIRECTED  20 tablet  3   No current facility-administered medications for this visit.    Allergies  Allergen Reactions  . Amoxicillin   . Levofloxacin   . Plavix [Clopidogrel Bisulfate]     12/12/11 patient reports she has never taken  Plavix before.     Review of Systems negative except from HPI and PMH  Physical Exam BP 137/71  Pulse 66  Ht 5\' 2"  (1.575 m)  Wt 129 lb (58.514 kg)  BMI 23.59 kg/m2 Well developed and nourished in no acute distress HENT normal Neck supple with JVP-flat Clear Regular rate and rhythm, no murmurs or gallops Abd-soft with active BS No Clubbing cyanosis edema Skin-warm and dry A & Oriented  Grossly normal sensory and motor function  ECG demonstrates ventricular pacing with complete heart block and underlying sinus rhythm   Assessment and  Plan  Complete heart block S./P. AV nodal ablation  Pacemaker-Medtronic   Pacemaker syndrome  Chest pain  The patient has reverted intercurrently to sinus rhythm resulting in complete heart block and atrial activity. Her device was  reprogrammed from the VVI mode--DDD mode with a significant improvement in her symptoms.  She has some ongoing problems with chest discomfort this afternoon. She had a negative Myoview 2014 so we will follow this.

## 2014-01-24 NOTE — Patient Instructions (Signed)
Your physician recommends that you continue on your current medications as directed. Please refer to the Current Medication list given to you today.  Remote monitoring is used to monitor your Pacemaker of ICD from home. This monitoring reduces the number of office visits required to check your device to one time per year. It allows [place] to keep an eye on the functioning of your device to ensure it is working properly. You are scheduled for a device check from home on 04/28/14. You may send your transmission at any time that day. If you have a wireless device, the transmission will be sent automatically. After your physician reviews your transmission, you will receive a postcard with your next transmission date.  Keep August follow up with Dr. Caryl Comes

## 2014-01-24 NOTE — Telephone Encounter (Signed)
Pt calling b/c she has been short of breath for 2 weeks. States she feels like her heart had also been beating too fast & maybe needs pacer rechecked. I have made pt appt today with Dr. Caryl Comes.  She will have someone bring her to the office & states she has been to the [facility] office in the past. Horton Chin RN

## 2014-01-28 ENCOUNTER — Telehealth: Payer: Self-pay | Admitting: Internal Medicine

## 2014-01-28 NOTE — Telephone Encounter (Signed)
Pt has had her pacemaker adjusted 01/24/14 and states she feels a bit better, less sob and chest ache since her heart rate isn't as fast. She was told to make a sooner app this week in [place] office if not better. She states she is still sob and chest aches, wants to know if she needs to live with this? Gave her an app in [place] and was told to go to ED with worsening sx. Pt agreed to plan.

## 2014-01-28 NOTE — Telephone Encounter (Signed)
New Message  Pt called states that she has aches in her chest and a little SOB.. She is not sure if an appt is needed or not.. She is requesting a call back to discuss.

## 2014-01-30 ENCOUNTER — Encounter: Payer: Self-pay | Admitting: Internal Medicine

## 2014-01-30 ENCOUNTER — Ambulatory Visit (INDEPENDENT_AMBULATORY_CARE_PROVIDER_SITE_OTHER): Payer: Medicare Other | Admitting: Internal Medicine

## 2014-01-30 VITALS — BP 144/84 | HR 138 | Ht 62.0 in | Wt 128.5 lb

## 2014-01-30 DIAGNOSIS — R079 Chest pain, unspecified: Secondary | ICD-10-CM

## 2014-01-30 DIAGNOSIS — I442 Atrioventricular block, complete: Secondary | ICD-10-CM

## 2014-01-30 LAB — MDC_IDC_ENUM_SESS_TYPE_INCLINIC
Battery Impedance: 111 Ohm
Battery Voltage: 2.79 V
Brady Statistic AP VP Percent: 1 %
Brady Statistic AS VS Percent: 0 %
Lead Channel Sensing Intrinsic Amplitude: 4 mV
Lead Channel Setting Pacing Amplitude: 2.5 V
Lead Channel Setting Pacing Pulse Width: 0.4 ms
Lead Channel Setting Sensing Sensitivity: 5.6 mV
MDC IDC MSMT BATTERY REMAINING LONGEVITY: 132 mo
MDC IDC MSMT LEADCHNL RA IMPEDANCE VALUE: 444 Ohm
MDC IDC MSMT LEADCHNL RV IMPEDANCE VALUE: 607 Ohm
MDC IDC MSMT LEADCHNL RV PACING THRESHOLD AMPLITUDE: 0.75 V
MDC IDC MSMT LEADCHNL RV PACING THRESHOLD PULSEWIDTH: 0.4 ms
MDC IDC SESS DTM: 20150402105748
MDC IDC SET LEADCHNL RV PACING AMPLITUDE: 2.5 V
MDC IDC STAT BRADY AP VS PERCENT: 0 %
MDC IDC STAT BRADY AS VP PERCENT: 99 %

## 2014-01-30 NOTE — Progress Notes (Signed)
Patient Care Team: Tama High III as PCP - General (Internal Medicine)   HPI  [name] is a 80 y.o. female With a history of permanent atrial fibrillation status post AV junction ablation for uncontrolled ventricular response this subsequently implanted pacemaker. She underwent generator replacement February 2014 which was inadvertently associated with failure to activate rate response. This was subsequently activated: Some degree of overresponsiveness causing her to have a sensation of tachypalpitations which was addressed by reprogramming.  She comes in with complaints of the last 3 weeks of palpitations dyspnea and lightheadedness.   Past Medical History  Diagnosis Date  . Non Hodgkin's lymphoma     stable; followed by an oncologist  . Hyperlipidemia   . Hypertension   . Hereditary hemochromatosis     Phlebotomized in the past, not getting phlebotomy now  . GERD (gastroesophageal reflux disease)   . Complete heart block  s/p av ablation     with atrial fibrillation. Pt is s/p AV nodal ablation due to permanent and symptomatic atrial fibrillation.   . Coronary artery disease     Nonobstructive  . C. difficile colitis     feces transplant  . Atrial fibrillation 07/20/2009    Qualifier: Diagnosis of  By: Caryl Comes, MD, [facility] Guise   . Pacemaker-dual-chamber-Medtronic 04/04/2011  . Pacemaker syndrome 01/24/2014    Past Surgical History  Procedure Laterality Date  . Cardiac catheterization    . Abdominal hysterectomy    . Cholecystectomy    . Atrial ablation surgery  2008    AV nodal ablation  . US echocardiography  07/2008    EF 55%, mild LVH. No significant valvular abnormalities  . Colonoscopy  oct.12, 2012  . Stool transplant    . Cardioversion    . Insert / replace / remove pacemaker      Current Outpatient Prescriptions  Medication Sig Dispense Refill  . alendronate (FOSAMAX) 70 MG tablet Take 70 mg by mouth every 7 (seven) days. Takes on Tuesdays.  Take with a full glass of water on an empty stomach.      . ALPRAZolam (XANAX) 0.25 MG tablet Take 0.5 mg by mouth at bedtime as needed for sleep.      [name] amLODipine (NORVASC) 5 MG tablet Take 1 tablet (5 mg total) by mouth daily.  30 tablet  6  . Cetirizine HCl (ZYRTEC ALLERGY PO) Take by mouth daily.       . fluticasone (FLONASE) 50 MCG/ACT nasal spray Place 2 sprays into the nose daily as needed for allergies.       . folic acid (FOLVITE) 1 MG tablet Take 1 mg by mouth daily.        [name] gabapentin (NEURONTIN) 400 MG capsule Take 400 mg by mouth daily.      . Pantoprazole Sodium (PROTONIX PO) Take 40 mg by mouth daily.      . polyvinyl alcohol (LIQUIFILM TEARS) 1.4 % ophthalmic solution Place 2 drops into both eyes 3 (three) times daily as needed (for dry eyes).      . warfarin (COUMADIN) 2 MG tablet Take 1 tablet (2 mg total) by mouth as directed.  30 tablet  3  . warfarin (COUMADIN) 3 MG tablet TAKE AS DIRECTED  20 tablet  3   No current facility-administered medications for this visit.    Allergies  Allergen Reactions  . Amoxicillin   . Levofloxacin   . Plavix [Clopidogrel Bisulfate]     12/12/11 patient  reports she has never taken Plavix before.     Review of Systems negative except from HPI and PMH  Physical Exam BP 144/84  Pulse 138  Ht 5\' 2"  (1.575 m)  Wt 128 lb 8 oz (58.287 kg)  BMI 23.50 kg/m2 Well developed and nourished in no acute distress HENT normal Neck supple with JVP-flat Clear Regular rate and rhythm, no murmurs or gallops Abd-soft with active BS No Clubbing cyanosis edema Skin-warm and dry A & Oriented  Grossly normal sensory and motor function  ECG demonstrates ventricular pacing with complete heart block and underlying sinus rhythm   Assessment and  Plan  Complete heart block S./P. AV nodal ablation  Pacemaker-Medtronic   Chest pain  Last week the patient did transiently reverted to sinus rhythm and we have reprogrammed her pacemaker VVI ICD DDD.  Unfortunately that were short sighted; and she is now returned back to atrial flutter with some tracking. Hence, we have reprogrammed her device [id number]. Hopefully this will take care of her symptoms..  She has some ongoing problems with chest discomfort this afternoon. She had a negative Myoview 2014 so we will follow this.

## 2014-01-30 NOTE — Patient Instructions (Signed)
Remote monitoring is used to monitor your Pacemaker of ICD from home. This monitoring reduces the number of office visits required to check your device to one time per year. It allows [place] to keep an eye on the functioning of your device to ensure it is working properly. You are scheduled for a device check from home on 05/05/14. You may send your transmission at any time that day. If you have a wireless device, the transmission will be sent automatically. After your physician reviews your transmission, you will receive a postcard with your next transmission date.  Your physician wants you to follow-up in: December 2015 with Dr. Caryl Comes. You will receive a reminder letter in the mail two months in advance. If you don't receive a letter, please call our office to schedule the follow-up appointment.

## 2014-02-05 ENCOUNTER — Encounter: Payer: Self-pay | Admitting: Internal Medicine

## 2014-02-05 DIAGNOSIS — J449 Chronic obstructive pulmonary disease, unspecified: Secondary | ICD-10-CM | POA: Insufficient documentation

## 2014-02-05 DIAGNOSIS — M545 Low back pain, unspecified: Secondary | ICD-10-CM | POA: Insufficient documentation

## 2014-02-05 DIAGNOSIS — M542 Cervicalgia: Secondary | ICD-10-CM | POA: Insufficient documentation

## 2014-02-05 DIAGNOSIS — K8010 Calculus of gallbladder with chronic cholecystitis without obstruction: Secondary | ICD-10-CM | POA: Insufficient documentation

## 2014-02-10 ENCOUNTER — Telehealth: Payer: Self-pay

## 2014-02-10 DIAGNOSIS — R002 Palpitations: Secondary | ICD-10-CM

## 2014-02-10 DIAGNOSIS — I4891 Unspecified atrial fibrillation: Secondary | ICD-10-CM

## 2014-02-10 NOTE — Telephone Encounter (Signed)
Pt called states her pacer was "reset last week, and my heart is still out of rhythm". Pt did schedule to come in tomorrow. Please call.

## 2014-02-10 NOTE — Telephone Encounter (Signed)
Patient feels like she is still out of rhythm  She stated she "feels fine other than just being able to tell she is out of rhythm" Her heart rate is 76  She made appt to see Dr. Caryl Comes tomorrow   I instructed her to seek emergency services if her heart rate sustained over 100 and she became symptomatic Patient verbalized understanding

## 2014-02-11 ENCOUNTER — Ambulatory Visit (INDEPENDENT_AMBULATORY_CARE_PROVIDER_SITE_OTHER): Payer: Medicare Other | Admitting: Internal Medicine

## 2014-02-11 ENCOUNTER — Encounter: Payer: Self-pay | Admitting: Internal Medicine

## 2014-02-11 VITALS — BP 146/59 | HR 61 | Ht 62.0 in | Wt 127.8 lb

## 2014-02-11 DIAGNOSIS — R079 Chest pain, unspecified: Secondary | ICD-10-CM

## 2014-02-11 LAB — MDC_IDC_ENUM_SESS_TYPE_INCLINIC
Battery Remaining Longevity: 124 mo
Brady Statistic AP VS Percent: 0 %
Brady Statistic AS VP Percent: 61 %
Date Time Interrogation Session: 20150414102323
Lead Channel Impedance Value: 451 Ohm
Lead Channel Setting Pacing Amplitude: 2.5 V
Lead Channel Setting Pacing Amplitude: 2.5 V
Lead Channel Setting Sensing Sensitivity: 5.6 mV
MDC IDC MSMT BATTERY IMPEDANCE: 135 Ohm
MDC IDC MSMT BATTERY VOLTAGE: 2.79 V
MDC IDC MSMT LEADCHNL RV IMPEDANCE VALUE: 607 Ohm
MDC IDC SET LEADCHNL RV PACING PULSEWIDTH: 0.4 ms
MDC IDC STAT BRADY AP VP PERCENT: 39 %
MDC IDC STAT BRADY AS VS PERCENT: 0 %

## 2014-02-11 MED ORDER — ISOSORBIDE MONONITRATE ER 30 MG PO TB24: 30.0000 mg | ORAL_TABLET | Freq: Every day | ORAL | Status: DC

## 2014-02-11 NOTE — Patient Instructions (Addendum)
Call me next week and let me know how you are feeling: [name] [phone]  Your physician has recommended you make the following change in your medication:  Start Imdur 30 mg daily   Your physician wants you to follow-up in: 6 months.  You will receive a reminder letter in the mail two months in advance. If you don't receive a letter, please call our office to schedule the follow-up appointment.

## 2014-02-11 NOTE — Progress Notes (Signed)
Patient Care Team: Tama High III as PCP - General (Internal Medicine)   HPI  [name] is a 80 y.o. female With a history of permanent atrial fibrillation status post AV junction ablation for uncontrolled ventricular response this subsequently implanted pacemaker. She underwent generator replacement February 2014 which was inadvertently associated with failure to activate rate response. This was subsequently activated: Some degree of overresponsiveness causing her to have a sensation of tachypalpitations which was addressed by reprogramming.  She comes in with complaints of the last 3 weeks of palpitations dyspnea and lightheadedness.  She continues to complain of palpitations but more chest discomfort and associated dyspnea. She has not had edema. She has had some recumbent dyspnea. Episodes of chest discomfort are about 5 minutes. They radiate into her neck and her back.  She has a history of remote catheterization in the 1990s. At that time her arteries were clean. His stress test in 2001 that was nonischemic and 80 Myoview 2014 that was unrevealing but is described as a suboptimal (very) study  Is also has a history of GE reflux disease. These episodes are unassociated with the brackish taste.  She is on PPI   Past Medical History  Diagnosis Date  . Non Hodgkin's lymphoma     stable; followed by an oncologist  . Hyperlipidemia   . Hypertension   . Hereditary hemochromatosis     Phlebotomized in the past, not getting phlebotomy now  . GERD (gastroesophageal reflux disease)   . Complete heart block  s/p av ablation     with atrial fibrillation. Pt is s/p AV nodal ablation due to permanent and symptomatic atrial fibrillation.   . Coronary artery disease     Nonobstructive  . C. difficile colitis     feces transplant  . Atrial fibrillation 07/20/2009    Qualifier: Diagnosis of  By: Caryl Comes, MD, Leon[facility] Rom[facility] Mack Guise   . Pacemaker-dual-chamber-Medtronic 04/04/2011  .  Pacemaker syndrome 01/24/2014    Past Surgical History  Procedure Laterality Date  . Cardiac catheterization    . Abdominal hysterectomy    . Cholecystectomy    . Atrial ablation surgery  2008    AV nodal ablation  . US echocardiography  07/2008    EF 55%, mild LVH. No significant valvular abnormalities  . Colonoscopy  oct.12, 2012  . Stool transplant    . Cardioversion    . Insert / replace / remove pacemaker      Current Outpatient Prescriptions  Medication Sig Dispense Refill  . alendronate (FOSAMAX) 70 MG tablet Take 70 mg by mouth every 7 (seven) days. Takes on Tuesdays. Take with a full glass of water on an empty stomach.      . ALPRAZolam (XANAX) 0.25 MG tablet Take 0.5 mg by mouth at bedtime as needed for sleep.      [name] amLODipine (NORVASC) 5 MG tablet Take 1 tablet (5 mg total) by mouth daily.  30 tablet  6  . Cetirizine HCl (ZYRTEC ALLERGY PO) Take by mouth daily.       . fluticasone (FLONASE) 50 MCG/ACT nasal spray Place 2 sprays into the nose daily as needed for allergies.       . folic acid (FOLVITE) 1 MG tablet Take 1 mg by mouth daily.        [name] gabapentin (NEURONTIN) 400 MG capsule Take 400 mg by mouth daily.      . Pantoprazole Sodium (PROTONIX PO) Take 40 mg by mouth daily.      [name]  polyvinyl alcohol (LIQUIFILM TEARS) 1.4 % ophthalmic solution Place 2 drops into both eyes 3 (three) times daily as needed (for dry eyes).      . warfarin (COUMADIN) 2 MG tablet Take 1 tablet (2 mg total) by mouth as directed.  30 tablet  3  . warfarin (COUMADIN) 3 MG tablet TAKE AS DIRECTED  20 tablet  3   No current facility-administered medications for this visit.    Allergies  Allergen Reactions  . Amoxicillin   . Levofloxacin   . Plavix [Clopidogrel Bisulfate]     12/12/11 patient reports she has never taken Plavix before.     Review of Systems negative except from HPI and PMH  Physical Exam BP 146/59  Pulse 61  Ht 5\' 2"  (1.575 m)  Wt 127 lb 12 oz (57.947 kg)  BMI 23.36  kg/m2 Well developed and nourished in no acute distress HENT normal Neck supple with JVP-flat Clear Regular rate and rhythm, no murmurs or gallops Abd-soft with active BS No Clubbing cyanosis edema Skin-warm and dry A & Oriented  Grossly normal sensory and motor function  ECG demonstrates ventricular pacing with complete heart block and underlying sinus rhythm   Assessment and  Plan  Complete heart block S./P. AV nodal ablation  Pacemaker-Medtronic   Chest pain  Chest pain with typical  and atypical findings.  I think the likelihood of coronary disease is low but unfortunately, with her last Myoview being such a poor study in the event that her symptoms persist I would undertake catheterization.  I note also that her pacemaker rate was programmed at 60; previously it had been 70. Perhaps this accounts for some of her symptoms. We will reprogram and see how she does.

## 2014-02-13 ENCOUNTER — Telehealth: Payer: Self-pay | Admitting: Internal Medicine

## 2014-02-13 NOTE — Telephone Encounter (Signed)
New problem    Pt's heart is racing and needs to speak to someone about her medication  ISOSORBIDE min-er.

## 2014-02-13 NOTE — Telephone Encounter (Signed)
[place] patient. Forwarding message to Dr. Olin Pia nurse in [place], [place].

## 2014-02-13 NOTE — Telephone Encounter (Signed)
Per Dr. Caryl Comes order 30 day monitor  Patient informed

## 2014-02-13 NOTE — Telephone Encounter (Signed)
Patient stated that Imdur was giving her a "really bad headache, a racing heart, keeping me from sleep, and making me anxious" Informed Ignacia Bayley NP  He instructed me to have patient stop the Imdur  Patient verbalized understanding   Instructed patient to notify EMS if she felt her situation becomes emergent  Also asked patient to call me tomorrow and let me know if symptoms improve

## 2014-02-19 ENCOUNTER — Ambulatory Visit (INDEPENDENT_AMBULATORY_CARE_PROVIDER_SITE_OTHER): Payer: Medicare Other

## 2014-02-19 DIAGNOSIS — Z5181 Encounter for therapeutic drug level monitoring: Secondary | ICD-10-CM

## 2014-02-19 DIAGNOSIS — I4891 Unspecified atrial fibrillation: Secondary | ICD-10-CM

## 2014-02-19 DIAGNOSIS — Z7901 Long term (current) use of anticoagulants: Secondary | ICD-10-CM

## 2014-02-19 LAB — POCT INR: INR: 3

## 2014-03-05 ENCOUNTER — Telehealth: Payer: Self-pay | Admitting: *Deleted

## 2014-03-05 NOTE — Telephone Encounter (Signed)
Looked up holter on [facility]  It was cancelled because patient did not return call to confirm address  Incorrect number was given to [facility]  Holter reordered  Patient instructed to call if she does not recv holter this week  Patient verbalized understanding

## 2014-03-05 NOTE — Telephone Encounter (Signed)
Please called and  is wanting to know about her heart monitor. When will it be set up?

## 2014-03-12 ENCOUNTER — Telehealth: Payer: Self-pay

## 2014-03-12 DIAGNOSIS — I4891 Unspecified atrial fibrillation: Secondary | ICD-10-CM

## 2014-03-12 NOTE — Telephone Encounter (Signed)
Pt wants to know how long she is supposed to wear the monitor. Please call.

## 2014-03-12 NOTE — Telephone Encounter (Signed)
Informed patient to wear monitor for 30 days  Patient verbalized understanding

## 2014-04-01 DIAGNOSIS — C9110 Chronic lymphocytic leukemia of B-cell type not having achieved remission: Secondary | ICD-10-CM | POA: Insufficient documentation

## 2014-04-02 ENCOUNTER — Ambulatory Visit (INDEPENDENT_AMBULATORY_CARE_PROVIDER_SITE_OTHER): Payer: Medicare Other

## 2014-04-02 ENCOUNTER — Other Ambulatory Visit: Payer: Self-pay | Admitting: Cardiovascular Disease

## 2014-04-02 DIAGNOSIS — Z5181 Encounter for therapeutic drug level monitoring: Secondary | ICD-10-CM

## 2014-04-02 DIAGNOSIS — I4891 Unspecified atrial fibrillation: Secondary | ICD-10-CM

## 2014-04-02 DIAGNOSIS — Z7901 Long term (current) use of anticoagulants: Secondary | ICD-10-CM

## 2014-04-02 LAB — POCT INR: INR: 4.2

## 2014-04-03 NOTE — Telephone Encounter (Signed)
Please review for refill, Thank You. 

## 2014-04-03 NOTE — Telephone Encounter (Signed)
Please review for refill. Thanks!  

## 2014-04-15 DIAGNOSIS — M6740 Ganglion, unspecified site: Secondary | ICD-10-CM | POA: Insufficient documentation

## 2014-04-15 DIAGNOSIS — M720 Palmar fascial fibromatosis [Dupuytren]: Secondary | ICD-10-CM | POA: Insufficient documentation

## 2014-04-15 DIAGNOSIS — M6530 Trigger finger, unspecified finger: Secondary | ICD-10-CM | POA: Insufficient documentation

## 2014-04-23 ENCOUNTER — Ambulatory Visit (INDEPENDENT_AMBULATORY_CARE_PROVIDER_SITE_OTHER): Payer: Medicare Other

## 2014-04-23 DIAGNOSIS — Z7901 Long term (current) use of anticoagulants: Secondary | ICD-10-CM

## 2014-04-23 DIAGNOSIS — Z5181 Encounter for therapeutic drug level monitoring: Secondary | ICD-10-CM

## 2014-04-23 DIAGNOSIS — I4891 Unspecified atrial fibrillation: Secondary | ICD-10-CM

## 2014-04-23 LAB — POCT INR: INR: 1.9

## 2014-04-29 ENCOUNTER — Ambulatory Visit (INDEPENDENT_AMBULATORY_CARE_PROVIDER_SITE_OTHER): Payer: Medicare Other

## 2014-04-29 ENCOUNTER — Telehealth: Payer: Self-pay | Admitting: *Deleted

## 2014-04-29 ENCOUNTER — Other Ambulatory Visit: Payer: Self-pay

## 2014-04-29 DIAGNOSIS — R002 Palpitations: Secondary | ICD-10-CM

## 2014-04-29 DIAGNOSIS — I4891 Unspecified atrial fibrillation: Secondary | ICD-10-CM

## 2014-04-29 NOTE — Telephone Encounter (Signed)
Spoke with patient  Informed her that per Dr. Caryl Comes her holter showed:  Mostly afib and some aflutter  Palpitations related to heart block   Patient would like to know if Dr. Caryl Comes would like her to start any new medications for this

## 2014-05-05 ENCOUNTER — Ambulatory Visit (INDEPENDENT_AMBULATORY_CARE_PROVIDER_SITE_OTHER): Payer: Medicare Other | Admitting: *Deleted

## 2014-05-05 ENCOUNTER — Encounter: Payer: Self-pay | Admitting: Internal Medicine

## 2014-05-05 ENCOUNTER — Telehealth: Payer: Self-pay | Admitting: Cardiology

## 2014-05-05 DIAGNOSIS — I4819 Other persistent atrial fibrillation: Secondary | ICD-10-CM

## 2014-05-05 DIAGNOSIS — Z950 Presence of cardiac pacemaker: Secondary | ICD-10-CM

## 2014-05-05 DIAGNOSIS — I4891 Unspecified atrial fibrillation: Secondary | ICD-10-CM

## 2014-05-05 NOTE — Progress Notes (Signed)
Remote pacemaker transmission.   

## 2014-05-05 NOTE — Telephone Encounter (Signed)
Attempt to call pt and confirm remote transmission for Monday July 6. Called both numbers listed. No answer.

## 2014-05-11 LAB — MDC_IDC_ENUM_SESS_TYPE_REMOTE
Battery Impedance: 135 Ohm
Brady Statistic AP VP Percent: 62 %
Brady Statistic AP VS Percent: 0 %
Brady Statistic AS VP Percent: 38 %
Brady Statistic AS VS Percent: 0 %
Lead Channel Impedance Value: 453 Ohm
Lead Channel Impedance Value: 564 Ohm
Lead Channel Sensing Intrinsic Amplitude: 2.8 mV
Lead Channel Setting Pacing Amplitude: 2.5 V
Lead Channel Setting Pacing Amplitude: 2.5 V
Lead Channel Setting Pacing Pulse Width: 0.4 ms
Lead Channel Setting Sensing Sensitivity: 5.6 mV
MDC IDC MSMT BATTERY REMAINING LONGEVITY: 118 mo
MDC IDC MSMT BATTERY VOLTAGE: 2.8 V
MDC IDC SESS DTM: 20150706134451

## 2014-05-13 DIAGNOSIS — R7309 Other abnormal glucose: Secondary | ICD-10-CM | POA: Insufficient documentation

## 2014-05-14 ENCOUNTER — Ambulatory Visit (INDEPENDENT_AMBULATORY_CARE_PROVIDER_SITE_OTHER): Payer: Medicare Other

## 2014-05-14 DIAGNOSIS — Z5181 Encounter for therapeutic drug level monitoring: Secondary | ICD-10-CM

## 2014-05-14 DIAGNOSIS — I4891 Unspecified atrial fibrillation: Secondary | ICD-10-CM

## 2014-05-14 DIAGNOSIS — Z7901 Long term (current) use of anticoagulants: Secondary | ICD-10-CM

## 2014-05-14 LAB — POCT INR: INR: 2.2

## 2014-05-14 NOTE — Telephone Encounter (Signed)
Spoke with Dr. Caryl Comes about this patients question  He stated that he will personally call patient today

## 2014-06-04 ENCOUNTER — Encounter: Payer: Self-pay | Admitting: Cardiology

## 2014-06-04 LAB — TROPONIN I: Troponin-I: <0.02 ng/mL

## 2014-06-04 LAB — URINALYSIS, COMPLETE
Bacteria: NONE SEEN
Bilirubin,UR: NEGATIVE
Blood: NEGATIVE
Glucose,UR: 50 mg/dL (ref 0–75)
Ketone: NEGATIVE
Leukocyte Esterase: NEGATIVE
NITRITE: NEGATIVE
PH: 7 (ref 4.5–8.0)
Protein: NEGATIVE
RBC,UR: 1 /HPF (ref 0–5)
SPECIFIC GRAVITY: 1.011 (ref 1.003–1.030)
SQUAMOUS EPITHELIAL: NONE SEEN

## 2014-06-04 LAB — COMPREHENSIVE METABOLIC PANEL
ALBUMIN: 3.6 g/dL (ref 3.4–5.0)
Alkaline Phosphatase: 105 U/L
Anion Gap: 10 (ref 7–16)
BILIRUBIN TOTAL: 0.8 mg/dL (ref 0.2–1.0)
BUN: 8 mg/dL (ref 7–18)
CHLORIDE: 93 mmol/L — AB (ref 98–107)
Calcium, Total: 8.4 mg/dL — ABNORMAL LOW (ref 8.5–10.1)
Co2: 22 mmol/L (ref 21–32)
Creatinine: 0.96 mg/dL (ref 0.60–1.30)
EGFR (African American): >60
GFR CALC NON AF AMER: 54 — AB
Glucose: 181 mg/dL — ABNORMAL HIGH (ref 65–99)
Osmolality: 254 (ref 275–301)
POTASSIUM: 4 mmol/L (ref 3.5–5.1)
SGOT(AST): 46 U/L — ABNORMAL HIGH (ref 15–37)
SGPT (ALT): 40 U/L
Sodium: 125 mmol/L — ABNORMAL LOW (ref 136–145)
Total Protein: 6.9 g/dL (ref 6.4–8.2)

## 2014-06-05 ENCOUNTER — Inpatient Hospital Stay: Payer: Self-pay | Admitting: Internal Medicine

## 2014-06-05 LAB — PROTIME-INR
INR: 3.1
INR: 2.6
PROTHROMBIN TIME: 27.4 s — AB (ref 11.5–14.7)
Prothrombin Time: 30.7 secs — ABNORMAL HIGH (ref 11.5–14.7)

## 2014-06-05 LAB — CBC WITH DIFFERENTIAL/PLATELET
Basophil #: 0.1 10*3/uL (ref 0.0–0.1)
Basophil %: 0.8 %
EOS ABS: 0 10*3/uL (ref 0.0–0.7)
EOS PCT: 0.5 %
HCT: 42.3 % (ref 35.0–47.0)
HGB: 15.1 g/dL (ref 12.0–16.0)
Lymphocyte #: 2.8 10*3/uL (ref 1.0–3.6)
Lymphocyte %: 37 %
MCH: 34.8 pg — ABNORMAL HIGH (ref 26.0–34.0)
MCHC: 35.7 g/dL (ref 32.0–36.0)
MCV: 98 fL (ref 80–100)
MONOS PCT: 9.4 %
Monocyte #: 0.7 x10 3/mm (ref 0.2–0.9)
Neutrophil #: 3.9 10*3/uL (ref 1.4–6.5)
Neutrophil %: 52.3 %
Platelet: 155 10*3/uL (ref 150–440)
RBC: 4.33 10*6/uL (ref 3.80–5.20)
RDW: 13.3 % (ref 11.5–14.5)
WBC: 7.5 10*3/uL (ref 3.6–11.0)

## 2014-06-05 LAB — CLOSTRIDIUM DIFFICILE(ARMC)

## 2014-06-05 LAB — WBCS, STOOL

## 2014-06-06 LAB — CBC WITH DIFFERENTIAL/PLATELET
BASOS PCT: 0.8 %
Basophil #: 0.1 10*3/uL (ref 0.0–0.1)
EOS PCT: 1.4 %
Eosinophil #: 0.1 10*3/uL (ref 0.0–0.7)
HCT: 43.1 % (ref 35.0–47.0)
HGB: 15.1 g/dL (ref 12.0–16.0)
Lymphocyte #: 3.6 10*3/uL (ref 1.0–3.6)
Lymphocyte %: 41.1 %
MCH: 34.1 pg — ABNORMAL HIGH (ref 26.0–34.0)
MCHC: 35.1 g/dL (ref 32.0–36.0)
MCV: 97 fL (ref 80–100)
MONO ABS: 0.8 x10 3/mm (ref 0.2–0.9)
Monocyte %: 9.4 %
Neutrophil #: 4.2 10*3/uL (ref 1.4–6.5)
Neutrophil %: 47.3 %
Platelet: 161 10*3/uL (ref 150–440)
RBC: 4.43 10*6/uL (ref 3.80–5.20)
RDW: 13.6 % (ref 11.5–14.5)
WBC: 8.9 10*3/uL (ref 3.6–11.0)

## 2014-06-06 LAB — BASIC METABOLIC PANEL
ANION GAP: 7 (ref 7–16)
BUN: 9 mg/dL (ref 7–18)
CO2: 25 mmol/L (ref 21–32)
CREATININE: 0.99 mg/dL (ref 0.60–1.30)
Calcium, Total: 8.2 mg/dL — ABNORMAL LOW (ref 8.5–10.1)
Chloride: 104 mmol/L (ref 98–107)
EGFR (Non-African Amer.): 52 — ABNORMAL LOW
GLUCOSE: 111 mg/dL — AB (ref 65–99)
OSMOLALITY: 271 (ref 275–301)
Potassium: 4.2 mmol/L (ref 3.5–5.1)
Sodium: 136 mmol/L (ref 136–145)

## 2014-06-06 LAB — MAGNESIUM: MAGNESIUM: 2 mg/dL

## 2014-06-06 LAB — PROTIME-INR
INR: 2.6
Prothrombin Time: 27.1 secs — ABNORMAL HIGH (ref 11.5–14.7)

## 2014-06-06 LAB — TSH: THYROID STIMULATING HORM: 6.19 u[IU]/mL — AB

## 2014-06-07 LAB — BASIC METABOLIC PANEL
Anion Gap: 5 — ABNORMAL LOW (ref 7–16)
BUN: 13 mg/dL (ref 7–18)
CHLORIDE: 105 mmol/L (ref 98–107)
Calcium, Total: 8.2 mg/dL — ABNORMAL LOW (ref 8.5–10.1)
Co2: 27 mmol/L (ref 21–32)
Creatinine: 0.93 mg/dL (ref 0.60–1.30)
EGFR (African American): >60
EGFR (Non-African Amer.): 56 — ABNORMAL LOW
Glucose: 114 mg/dL — ABNORMAL HIGH (ref 65–99)
Osmolality: 275 (ref 275–301)
Potassium: 4.5 mmol/L (ref 3.5–5.1)
SODIUM: 137 mmol/L (ref 136–145)

## 2014-06-07 LAB — URINALYSIS, COMPLETE
Bacteria: NONE SEEN
Bilirubin,UR: NEGATIVE
Blood: NEGATIVE
Glucose,UR: NEGATIVE mg/dL (ref 0–75)
KETONE: NEGATIVE
Leukocyte Esterase: NEGATIVE
NITRITE: NEGATIVE
Ph: 6 (ref 4.5–8.0)
Protein: NEGATIVE
RBC, UR: NONE SEEN /HPF (ref 0–5)
SQUAMOUS EPITHELIAL: NONE SEEN
Specific Gravity: 1.004 (ref 1.003–1.030)
WBC UR: NONE SEEN /HPF (ref 0–5)

## 2014-06-07 LAB — STOOL CULTURE

## 2014-06-07 LAB — PROTIME-INR
INR: 2.8
PROTHROMBIN TIME: 28.7 s — AB (ref 11.5–14.7)

## 2014-06-08 LAB — COMPREHENSIVE METABOLIC PANEL
ALBUMIN: 3.2 g/dL — AB (ref 3.4–5.0)
ALK PHOS: 89 U/L
ALT: 40 U/L
AST: 35 U/L (ref 15–37)
Anion Gap: 8 (ref 7–16)
BILIRUBIN TOTAL: 0.7 mg/dL (ref 0.2–1.0)
BUN: 12 mg/dL (ref 7–18)
CHLORIDE: 104 mmol/L (ref 98–107)
CREATININE: 0.79 mg/dL (ref 0.60–1.30)
Calcium, Total: 8.4 mg/dL — ABNORMAL LOW (ref 8.5–10.1)
Co2: 25 mmol/L (ref 21–32)
EGFR (African American): >60
Glucose: 111 mg/dL — ABNORMAL HIGH (ref 65–99)
Osmolality: 274 (ref 275–301)
Potassium: 5.3 mmol/L — ABNORMAL HIGH (ref 3.5–5.1)
SODIUM: 137 mmol/L (ref 136–145)
Total Protein: 6.3 g/dL — ABNORMAL LOW (ref 6.4–8.2)

## 2014-06-08 LAB — CBC WITH DIFFERENTIAL/PLATELET
Basophil #: 0.1 10*3/uL (ref 0.0–0.1)
Basophil %: 0.6 %
EOS PCT: 2.2 %
Eosinophil #: 0.2 10*3/uL (ref 0.0–0.7)
HCT: 43.6 % (ref 35.0–47.0)
HGB: 15.7 g/dL (ref 12.0–16.0)
Lymphocyte #: 4.4 10*3/uL — ABNORMAL HIGH (ref 1.0–3.6)
Lymphocyte %: 46.6 %
MCH: 35.6 pg — ABNORMAL HIGH (ref 26.0–34.0)
MCHC: 36 g/dL (ref 32.0–36.0)
MCV: 99 fL (ref 80–100)
Monocyte #: 0.9 x10 3/mm (ref 0.2–0.9)
Monocyte %: 9.4 %
NEUTROS ABS: 3.9 10*3/uL (ref 1.4–6.5)
NEUTROS PCT: 41.2 %
PLATELETS: 194 10*3/uL (ref 150–440)
RBC: 4.41 10*6/uL (ref 3.80–5.20)
RDW: 13.7 % (ref 11.5–14.5)
WBC: 9.5 10*3/uL (ref 3.6–11.0)

## 2014-06-08 LAB — PROTIME-INR
INR: 2.8
Prothrombin Time: 28.5 secs — ABNORMAL HIGH (ref 11.5–14.7)

## 2014-06-11 ENCOUNTER — Ambulatory Visit (INDEPENDENT_AMBULATORY_CARE_PROVIDER_SITE_OTHER): Payer: Medicare Other | Admitting: *Deleted

## 2014-06-11 DIAGNOSIS — Z7901 Long term (current) use of anticoagulants: Secondary | ICD-10-CM

## 2014-06-11 DIAGNOSIS — Z5181 Encounter for therapeutic drug level monitoring: Secondary | ICD-10-CM

## 2014-06-11 DIAGNOSIS — I4891 Unspecified atrial fibrillation: Secondary | ICD-10-CM

## 2014-06-11 LAB — POCT INR: INR: 2.9

## 2014-06-16 ENCOUNTER — Telehealth: Payer: Self-pay | Admitting: *Deleted

## 2014-06-16 NOTE — Telephone Encounter (Signed)
Error

## 2014-06-18 ENCOUNTER — Ambulatory Visit (INDEPENDENT_AMBULATORY_CARE_PROVIDER_SITE_OTHER): Payer: Medicare Other

## 2014-06-18 DIAGNOSIS — Z5181 Encounter for therapeutic drug level monitoring: Secondary | ICD-10-CM

## 2014-06-18 DIAGNOSIS — I4891 Unspecified atrial fibrillation: Secondary | ICD-10-CM

## 2014-06-18 DIAGNOSIS — Z7901 Long term (current) use of anticoagulants: Secondary | ICD-10-CM

## 2014-06-18 LAB — POCT INR: INR: 2.3

## 2014-06-25 ENCOUNTER — Ambulatory Visit (INDEPENDENT_AMBULATORY_CARE_PROVIDER_SITE_OTHER): Payer: Medicare Other

## 2014-06-25 DIAGNOSIS — Z5181 Encounter for therapeutic drug level monitoring: Secondary | ICD-10-CM

## 2014-06-25 DIAGNOSIS — Z7901 Long term (current) use of anticoagulants: Secondary | ICD-10-CM

## 2014-06-25 DIAGNOSIS — I4891 Unspecified atrial fibrillation: Secondary | ICD-10-CM

## 2014-06-25 LAB — POCT INR: INR: 3.7

## 2014-07-09 ENCOUNTER — Ambulatory Visit (INDEPENDENT_AMBULATORY_CARE_PROVIDER_SITE_OTHER): Payer: Medicare Other

## 2014-07-09 ENCOUNTER — Telehealth: Payer: Self-pay | Admitting: *Deleted

## 2014-07-09 DIAGNOSIS — Z7901 Long term (current) use of anticoagulants: Secondary | ICD-10-CM

## 2014-07-09 DIAGNOSIS — Z5181 Encounter for therapeutic drug level monitoring: Secondary | ICD-10-CM

## 2014-07-09 DIAGNOSIS — I4891 Unspecified atrial fibrillation: Secondary | ICD-10-CM

## 2014-07-09 LAB — POCT INR: INR: 2

## 2014-07-09 NOTE — Telephone Encounter (Signed)
error 

## 2014-07-22 ENCOUNTER — Telehealth: Payer: Self-pay | Admitting: *Deleted

## 2014-07-22 ENCOUNTER — Encounter: Payer: Self-pay | Admitting: Internal Medicine

## 2014-07-22 ENCOUNTER — Ambulatory Visit (INDEPENDENT_AMBULATORY_CARE_PROVIDER_SITE_OTHER): Payer: Medicare Other | Admitting: Internal Medicine

## 2014-07-22 VITALS — BP 126/77 | HR 75 | Wt 125.0 lb

## 2014-07-22 DIAGNOSIS — I4891 Unspecified atrial fibrillation: Secondary | ICD-10-CM

## 2014-07-22 DIAGNOSIS — I482 Chronic atrial fibrillation, unspecified: Secondary | ICD-10-CM

## 2014-07-22 DIAGNOSIS — I442 Atrioventricular block, complete: Secondary | ICD-10-CM

## 2014-07-22 LAB — MDC_IDC_ENUM_SESS_TYPE_INCLINIC
Battery Remaining Longevity: 114 mo
Battery Voltage: 2.79 V
Brady Statistic AP VP Percent: 61 %
Brady Statistic AS VS Percent: 0 %
Date Time Interrogation Session: 20150922110613
Lead Channel Impedance Value: 576 Ohm
Lead Channel Pacing Threshold Amplitude: 0.75 V
Lead Channel Pacing Threshold Pulse Width: 0.4 ms
Lead Channel Setting Pacing Pulse Width: 0.4 ms
MDC IDC MSMT BATTERY IMPEDANCE: 159 Ohm
MDC IDC MSMT LEADCHNL RA IMPEDANCE VALUE: 457 Ohm
MDC IDC MSMT LEADCHNL RA SENSING INTR AMPL: 0.7 mV
MDC IDC SET LEADCHNL RA PACING AMPLITUDE: 2.5 V
MDC IDC SET LEADCHNL RV PACING AMPLITUDE: 2.5 V
MDC IDC SET LEADCHNL RV SENSING SENSITIVITY: 5.6 mV
MDC IDC STAT BRADY AP VS PERCENT: 0 %
MDC IDC STAT BRADY AS VP PERCENT: 39 %

## 2014-07-22 NOTE — Patient Instructions (Signed)
Your physician wants you to follow-up in: 6 months with Dr. Caryl Comes. You will receive a reminder letter in the mail two months in advance. If you don't receive a letter, please call our office to schedule the follow-up appointment.  Remote monitoring is used to monitor your Pacemaker of ICD from home. This monitoring reduces the number of office visits required to check your device to one time per year. It allows [place] to keep an eye on the functioning of your device to ensure it is working properly. You are scheduled for a device check from home on 10/23/14. You may send your transmission at any time that day. If you have a wireless device, the transmission will be sent automatically. After your physician reviews your transmission, you will receive a postcard with your next transmission date.

## 2014-07-22 NOTE — Telephone Encounter (Signed)
Error

## 2014-07-22 NOTE — Progress Notes (Signed)
Patient Care Team: Tama High III as PCP - General (Internal Medicine)   HPI  [name] is a 80 y.o. female With a history of permanent atrial fibrillation status post AV junction ablation for uncontrolled ventricular response this subsequently implanted pacemaker. She underwent generator replacement February 2014 which was inadvertently associated with failure to activate rate response. This was subsequently activated: Some degree of overresponsiveness causing her to have a sensation of tachypalpitations which was addressed by reprogramming.    With was seen last in March, she also had complaints of chest discomfort and associated dyspnea.  She has had some recumbent dyspnea. Episodes of chest discomfort are about 5 minutes. They radiate into her neck and her back. Empiric efforts to use nitroglycerin were complicated by tachypalpitations and a headache. It was discontinued  she has no complaints of chest pain at this point. Mostly she is bothered by flutters.  We undertook a event recorder. This demonstrated complete heart block as anticipated from her AV junction ablation  She has a history of remote catheterization in the 1990s. At that time her arteries were clean. His stress test in 2001 that was nonischemic and Myoview 2014 that was unrevealing  \  She also complains of dizziness. It has been ascribed to a meclizine sensitive process. It is notable upon standing. It does not occur while seated or while lying down. It typically abates over a couple of minutes.  Is also has a history of GE reflux disease. These episodes are unassociated with the brackish taste.  She is on PPI   Past Medical History  Diagnosis Date  . Non Hodgkin's lymphoma     stable; followed by an oncologist  . Hyperlipidemia   . Hypertension   . Hereditary hemochromatosis     Phlebotomized in the past, not getting phlebotomy now  . GERD (gastroesophageal reflux disease)   . Complete heart block   s/p av ablation     with atrial fibrillation. Pt is s/p AV nodal ablation due to permanent and symptomatic atrial fibrillation.   . Coronary artery disease     Nonobstructive  . C. difficile colitis     feces transplant  . Atrial fibrillation 07/20/2009    Qualifier: Diagnosis of  By: Caryl Comes, MD, Leonidas Romberg Mack Guise   . Pacemaker-dual-chamber-Medtronic 04/04/2011  . 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 Diverticulitis     Past Surgical History  Procedure Laterality Date  . Cardiac catheterization    . Abdominal hysterectomy    . Cholecystectomy    . Atrial ablation surgery  2008    AV nodal ablation  . US echocardiography  07/2008    EF 55%, mild LVH. No significant valvular abnormalities  . Colonoscopy  oct.12, 2012  . Stool transplant    . Cardioversion    . Insert / replace / remove pacemaker      Current Outpatient Prescriptions  Medication Sig Dispense Refill  . alendronate (FOSAMAX) 70 MG tablet Take 70 mg by mouth every 7 (seven) days. Takes on Tuesdays. Take with a full glass of water on an empty stomach.      . ALPRAZolam (XANAX) 0.25 MG tablet Take 0.5 mg by mouth at bedtime as needed for sleep.      [name] amLODipine (NORVASC) 5 MG tablet Take 1 tablet (5 mg total) by mouth daily.  30 tablet  6  . Cetirizine HCl (ZYRTEC ALLERGY PO) Take by mouth daily.       [name]  fluticasone (FLONASE) 50 MCG/ACT nasal spray Place 2 sprays into the nose daily as needed for allergies.       . folic acid (FOLVITE) 1 MG tablet Take 1 mg by mouth daily.        [name] gabapentin (NEURONTIN) 100 MG capsule Take 100 mg by mouth. Takes 1 tablet in the am daily.      [name] gabapentin (NEURONTIN) 400 MG capsule Take 400 mg by mouth at bedtime.       . Pantoprazole Sodium (PROTONIX PO) Take 40 mg by mouth daily.      . polyvinyl alcohol (LIQUIFILM TEARS) 1.4 % ophthalmic solution Place 2 drops into both eyes 3 (three) times daily as needed (for dry eyes).      . warfarin (COUMADIN) 2 MG tablet TAKE 1 TABLET (2 MG TOTAL)  BY MOUTH AS DIRECTED.  30 tablet  3  . warfarin (COUMADIN) 3 MG tablet TAKE AS DIRECTED  20 tablet  3   No current facility-administered medications for this visit.    Allergies  Allergen Reactions  . Amoxicillin   . Levofloxacin   . Plavix [Clopidogrel Bisulfate]     12/12/11 patient reports she has never taken Plavix before.     Review of Systems negative except from HPI and PMH  Physical Exam BP 126/77  Pulse 75  Wt 125 lb (56.7 kg) Well developed and nourished in no acute distress HENT normal Neck supple with JVP-flat Clear Regular rate and rhythm, no murmurs or gallops Abd-soft with active BS No Clubbing cyanosis edema Skin-warm and dry A & Oriented  Grossly normal sensory and motor function  ECG demonstrates ventricular pacing with complete heart block and underlying sinus rhythm   Assessment and  Plan  Complete heart block S./P. AV nodal ablation  Pacemaker-Medtronic   Flutters  Orthostatic dizziness  We have reprogrammed her pacemaker lower rate from 70--60 to see if this doesn't attenuate some of her symptoms. We have also decrease her ADL rate from 2--1.  I was surprised that orthostatic vital signs did not confirm orthostatic hypotension given her symptoms. I suggested however that she go gradually from a lying to standing position and taken deep to her through sitting. In addition, I suggested that she stands still for just a couple of moments prior to starting to walk.  Her chest pain syndrome has resolved. Is primarily flutters. Event recorder demonstrated no irregularity in ventricular rate. There may be some association of her palpitations with a synchronous pacing versus ventricular pacing during atrial fibrillation; however, against this is a very high burden of atrial fibrillation that she endures, greater than 80%.

## 2014-07-24 ENCOUNTER — Other Ambulatory Visit: Payer: Self-pay | Admitting: Unknown Physician Specialty

## 2014-07-24 LAB — CLOSTRIDIUM DIFFICILE(ARMC)

## 2014-07-30 ENCOUNTER — Ambulatory Visit (INDEPENDENT_AMBULATORY_CARE_PROVIDER_SITE_OTHER): Payer: Medicare Other

## 2014-07-30 DIAGNOSIS — I4891 Unspecified atrial fibrillation: Secondary | ICD-10-CM

## 2014-07-30 DIAGNOSIS — Z7901 Long term (current) use of anticoagulants: Secondary | ICD-10-CM

## 2014-07-30 DIAGNOSIS — Z5181 Encounter for therapeutic drug level monitoring: Secondary | ICD-10-CM

## 2014-07-30 LAB — POCT INR: INR: 3.2

## 2014-08-20 ENCOUNTER — Ambulatory Visit (INDEPENDENT_AMBULATORY_CARE_PROVIDER_SITE_OTHER): Payer: Medicare Other

## 2014-08-20 DIAGNOSIS — Z7901 Long term (current) use of anticoagulants: Secondary | ICD-10-CM

## 2014-08-20 DIAGNOSIS — Z5181 Encounter for therapeutic drug level monitoring: Secondary | ICD-10-CM

## 2014-08-20 DIAGNOSIS — I4891 Unspecified atrial fibrillation: Secondary | ICD-10-CM

## 2014-08-20 LAB — POCT INR: INR: 3.5

## 2014-09-03 ENCOUNTER — Ambulatory Visit (INDEPENDENT_AMBULATORY_CARE_PROVIDER_SITE_OTHER): Payer: Medicare Other

## 2014-09-03 DIAGNOSIS — I4891 Unspecified atrial fibrillation: Secondary | ICD-10-CM

## 2014-09-03 DIAGNOSIS — Z5181 Encounter for therapeutic drug level monitoring: Secondary | ICD-10-CM

## 2014-09-03 DIAGNOSIS — Z7901 Long term (current) use of anticoagulants: Secondary | ICD-10-CM

## 2014-09-03 LAB — POCT INR: INR: 2.7

## 2014-09-08 DIAGNOSIS — M1990 Unspecified osteoarthritis, unspecified site: Secondary | ICD-10-CM | POA: Insufficient documentation

## 2014-10-01 ENCOUNTER — Ambulatory Visit (INDEPENDENT_AMBULATORY_CARE_PROVIDER_SITE_OTHER): Payer: Medicare Other

## 2014-10-01 DIAGNOSIS — I4891 Unspecified atrial fibrillation: Secondary | ICD-10-CM

## 2014-10-01 DIAGNOSIS — Z5181 Encounter for therapeutic drug level monitoring: Secondary | ICD-10-CM

## 2014-10-01 DIAGNOSIS — Z7901 Long term (current) use of anticoagulants: Secondary | ICD-10-CM

## 2014-10-01 LAB — POCT INR: INR: 2.4

## 2014-10-09 ENCOUNTER — Encounter (HOSPITAL_COMMUNITY): Payer: Self-pay | Admitting: Internal Medicine

## 2014-10-20 ENCOUNTER — Telehealth: Payer: Self-pay

## 2014-10-20 NOTE — Telephone Encounter (Signed)
Pt called trying to figure out why we have not called. Told patient it was sent to nurse. It may take some time. She stated she wants any doctor to tell her. She also stated that she needs to know today. So she said she would call .

## 2014-10-20 NOTE — Telephone Encounter (Signed)
Spoke with pt.  Explained if it is a simple extraction, it should be okay to do while on Coumadin.  Pt has not seen the dentist yet.  She has an appt this afternoon and will see what the plan is at that time.  If the dentist wants her off of Coumadin she is aware she needs to let our office know first.

## 2014-10-20 NOTE — Telephone Encounter (Signed)
Pt states she had a tooth break off this weekend, and may have to have it pulled. Asks if so, does she need to stop Coumadin? Please advise

## 2014-10-23 ENCOUNTER — Ambulatory Visit (INDEPENDENT_AMBULATORY_CARE_PROVIDER_SITE_OTHER): Payer: Medicare Other | Admitting: *Deleted

## 2014-10-23 DIAGNOSIS — I4891 Unspecified atrial fibrillation: Secondary | ICD-10-CM

## 2014-10-27 DIAGNOSIS — I4891 Unspecified atrial fibrillation: Secondary | ICD-10-CM

## 2014-10-27 LAB — MDC_IDC_ENUM_SESS_TYPE_REMOTE
Battery Impedance: 159 Ohm
Brady Statistic AS VS Percent: 0 %
Lead Channel Impedance Value: 432 Ohm
Lead Channel Sensing Intrinsic Amplitude: 2.8 mV
Lead Channel Sensing Intrinsic Amplitude: 5.6 mV
Lead Channel Setting Pacing Amplitude: 2.5 V
Lead Channel Setting Pacing Pulse Width: 0.4 ms
Lead Channel Setting Sensing Sensitivity: 5.6 mV
MDC IDC MSMT BATTERY REMAINING LONGEVITY: 116 mo
MDC IDC MSMT BATTERY VOLTAGE: 2.8 V
MDC IDC MSMT LEADCHNL RV IMPEDANCE VALUE: 534 Ohm
MDC IDC SESS DTM: 20151228133652
MDC IDC SET LEADCHNL RV PACING AMPLITUDE: 2.5 V
MDC IDC STAT BRADY AP VP PERCENT: 38 %
MDC IDC STAT BRADY AP VS PERCENT: 0 %
MDC IDC STAT BRADY AS VP PERCENT: 62 %

## 2014-10-27 NOTE — Progress Notes (Signed)
Remote pacemaker transmission.   

## 2014-10-29 ENCOUNTER — Ambulatory Visit (INDEPENDENT_AMBULATORY_CARE_PROVIDER_SITE_OTHER): Payer: Medicare Other

## 2014-10-29 DIAGNOSIS — I4891 Unspecified atrial fibrillation: Secondary | ICD-10-CM

## 2014-10-29 DIAGNOSIS — Z7901 Long term (current) use of anticoagulants: Secondary | ICD-10-CM

## 2014-10-29 DIAGNOSIS — Z5181 Encounter for therapeutic drug level monitoring: Secondary | ICD-10-CM

## 2014-10-29 LAB — POCT INR: INR: 1.9

## 2014-11-06 ENCOUNTER — Other Ambulatory Visit: Payer: Self-pay | Admitting: Cardiovascular Disease

## 2014-11-06 NOTE — Telephone Encounter (Signed)
Please review for refill, Thank you. 

## 2014-11-07 ENCOUNTER — Ambulatory Visit (INDEPENDENT_AMBULATORY_CARE_PROVIDER_SITE_OTHER): Payer: Medicare Other | Admitting: Cardiovascular Disease

## 2014-11-07 ENCOUNTER — Telehealth: Payer: Self-pay | Admitting: Internal Medicine

## 2014-11-07 DIAGNOSIS — I4891 Unspecified atrial fibrillation: Secondary | ICD-10-CM

## 2014-11-07 DIAGNOSIS — Z5181 Encounter for therapeutic drug level monitoring: Secondary | ICD-10-CM

## 2014-11-07 LAB — PROTIME-INR: INR: 2 — AB (ref <=1.1)

## 2014-11-07 NOTE — Telephone Encounter (Signed)
Results noted called pt with results, see anticoagulation note in Epic.

## 2014-11-07 NOTE — Telephone Encounter (Signed)
Patient had Labs drawn at [facility] today.  Dr. Olin Pia nurse called to give values.  PT: 21.9 INR: 2.0 drawn 11-07-14

## 2014-11-14 ENCOUNTER — Encounter: Payer: Self-pay | Admitting: Cardiology

## 2014-11-19 ENCOUNTER — Encounter: Payer: Self-pay | Admitting: Internal Medicine

## 2014-11-26 ENCOUNTER — Ambulatory Visit (INDEPENDENT_AMBULATORY_CARE_PROVIDER_SITE_OTHER): Payer: Medicare Other | Admitting: Pharmacist

## 2014-11-26 DIAGNOSIS — I4891 Unspecified atrial fibrillation: Secondary | ICD-10-CM

## 2014-11-26 DIAGNOSIS — Z7901 Long term (current) use of anticoagulants: Secondary | ICD-10-CM

## 2014-11-26 DIAGNOSIS — Z5181 Encounter for therapeutic drug level monitoring: Secondary | ICD-10-CM

## 2014-11-26 LAB — POCT INR: INR: 2.3

## 2014-12-24 ENCOUNTER — Ambulatory Visit (INDEPENDENT_AMBULATORY_CARE_PROVIDER_SITE_OTHER): Payer: Medicare Other

## 2014-12-24 DIAGNOSIS — Z7901 Long term (current) use of anticoagulants: Secondary | ICD-10-CM

## 2014-12-24 DIAGNOSIS — I4891 Unspecified atrial fibrillation: Secondary | ICD-10-CM

## 2014-12-24 DIAGNOSIS — Z5181 Encounter for therapeutic drug level monitoring: Secondary | ICD-10-CM

## 2014-12-24 LAB — POCT INR: INR: 3.4

## 2015-01-13 ENCOUNTER — Encounter: Payer: Self-pay | Admitting: Internal Medicine

## 2015-01-13 ENCOUNTER — Ambulatory Visit (INDEPENDENT_AMBULATORY_CARE_PROVIDER_SITE_OTHER): Payer: Medicare Other | Admitting: Internal Medicine

## 2015-01-13 ENCOUNTER — Ambulatory Visit (INDEPENDENT_AMBULATORY_CARE_PROVIDER_SITE_OTHER): Payer: Medicare Other | Admitting: Pharmacist

## 2015-01-13 VITALS — BP 116/70 | HR 67 | Ht 63.5 in | Wt 124.0 lb

## 2015-01-13 DIAGNOSIS — Z5181 Encounter for therapeutic drug level monitoring: Secondary | ICD-10-CM

## 2015-01-13 DIAGNOSIS — I4891 Unspecified atrial fibrillation: Secondary | ICD-10-CM | POA: Diagnosis not present

## 2015-01-13 DIAGNOSIS — Z7901 Long term (current) use of anticoagulants: Secondary | ICD-10-CM | POA: Diagnosis not present

## 2015-01-13 DIAGNOSIS — I442 Atrioventricular block, complete: Secondary | ICD-10-CM | POA: Diagnosis not present

## 2015-01-13 LAB — MDC_IDC_ENUM_SESS_TYPE_INCLINIC
Battery Impedance: 183 Ohm
Battery Voltage: 2.79 V
Brady Statistic AP VP Percent: 37 %
Brady Statistic AP VS Percent: 0 %
Brady Statistic AS VP Percent: 63 %
Brady Statistic AS VS Percent: 0 %
Date Time Interrogation Session: 20160315104731
Lead Channel Impedance Value: 450 Ohm
Lead Channel Pacing Threshold Amplitude: 1 V
Lead Channel Pacing Threshold Pulse Width: 0.4 ms
Lead Channel Setting Pacing Amplitude: 2.5 V
Lead Channel Setting Pacing Pulse Width: 0.4 ms
Lead Channel Setting Sensing Sensitivity: 5.6 mV
MDC IDC MSMT BATTERY REMAINING LONGEVITY: 112 mo
MDC IDC MSMT LEADCHNL RA SENSING INTR AMPL: 0.7 mV
MDC IDC MSMT LEADCHNL RV IMPEDANCE VALUE: 528 Ohm
MDC IDC SET LEADCHNL RV PACING AMPLITUDE: 2.5 V

## 2015-01-13 LAB — POCT INR: INR: 3.1

## 2015-01-13 NOTE — Progress Notes (Signed)
Patient Care Team: Tama High III as PCP - General (Internal Medicine)   HPI  [name] is a 81 y.o. female With a history of permanent atrial fibrillation status post AV junction ablation for uncontrolled ventricular response this subsequently implanted pacemaker. She underwent generator replacement February 2014 which was inadvertently associated with failure to activate rate response. This was subsequently activated: Some degree of overresponsiveness causing her to have a sensation of tachypalpitations which was addressed by reprogramming.    She has less dyspnea and just rare flutterrs;  Dizziness is almost absent, these since we reprogrammend.   She has a history of remote catheterization in the 1990s. At that time her arteries were clean. His stress test in 2001 that was nonischemic and Myoview 2014 that was unrevealing  \  She also has a history of GE reflux disease.    Past Medical History  Diagnosis Date  . Non Hodgkin's lymphoma     stable; followed by an oncologist  . Hyperlipidemia   . Hypertension   . Hereditary hemochromatosis     Phlebotomized in the past, not getting phlebotomy now  . GERD (gastroesophageal reflux disease)   . Complete heart block  s/p av ablation     with atrial fibrillation. Pt is s/p AV nodal ablation due to permanent and symptomatic atrial fibrillation.   . Coronary artery disease     Nonobstructive  . C. difficile colitis     feces transplant  . Atrial fibrillation 07/20/2009    Qualifier: Diagnosis of  By: Caryl Comes, MD, Leonidas Romberg Mack Guise   . Pacemaker-dual-chamber-Medtronic 04/04/2011  . 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 Diverticulitis   . Osteoarthritis     Past Surgical History  Procedure Laterality Date  . Cardiac catheterization    . Abdominal hysterectomy    . Cholecystectomy    . Atrial ablation surgery  2008    AV nodal ablation  . US echocardiography  07/2008    EF 55%, mild LVH. No significant valvular  abnormalities  . Colonoscopy  oct.12, 2012  . Stool transplant    . Cardioversion    . Insert / replace / remove pacemaker    . Pacemaker generator change N/A 12/17/2012    Procedure: PACEMAKER GENERATOR CHANGE;  Surgeon: Deboraha Sprang, MD;  Location[facility] CATH LAB;  Service: Cardiovascular;  Laterality: N/A;    Current Outpatient Prescriptions  Medication Sig Dispense Refill  . alendronate (FOSAMAX) 70 MG tablet Take 70 mg by mouth every 7 (seven) days. Takes on Tuesdays. Take with a full glass of water on an empty stomach.    . ALPRAZolam (XANAX) 0.25 MG tablet Take 0.5 mg by mouth at bedtime as needed for sleep.    [name] amLODipine (NORVASC) 5 MG tablet Take 1 tablet (5 mg total) by mouth daily. 30 tablet 6  . Cetirizine HCl (ZYRTEC ALLERGY PO) Take by mouth daily.     . fluticasone (FLONASE) 50 MCG/ACT nasal spray Place 2 sprays into the nose daily as needed for allergies.     . folic acid (FOLVITE) 1 MG tablet Take 1 mg by mouth daily.      [name] gabapentin (NEURONTIN) 100 MG capsule Take 100 mg by mouth. Takes 1 tablet in the am daily.    [name] gabapentin (NEURONTIN) 400 MG capsule Take 400 mg by mouth at bedtime.     . Pantoprazole Sodium (PROTONIX PO) Take 40 mg by mouth daily.    . polyvinyl alcohol (  LIQUIFILM TEARS) 1.4 % ophthalmic solution Place 2 drops into both eyes 3 (three) times daily as needed (for dry eyes).    . warfarin (COUMADIN) 2 MG tablet TAKE ONE TABLET BY MOUTH AS DIRECTED 30 tablet 2  . warfarin (COUMADIN) 3 MG tablet TAKE AS DIRECTED 20 tablet 2   No current facility-administered medications for this visit.    Allergies  Allergen Reactions  . Amoxicillin   . Levofloxacin   . Plavix [Clopidogrel Bisulfate]     12/12/11 patient reports she has never taken Plavix before.     Review of Systems negative except from HPI and PMH  Physical Exam BP 116/70 mmHg  Pulse 67  Ht 5' 3.5" (1.613 m)  Wt 124 lb (56.246 kg)  BMI 21.62 kg/m2 Well developed and nourished in no  acute distress HENT normal Neck supple with JVP-flat Clear Regular rate and rhythm, no murmurs or gallops Abd-soft with active BS No Clubbing cyanosis edema Skin-warm and dry A & Oriented  Grossly normal sensory and motor function  ECG demonstrates ventricular pacing with complete heart block and underlying sinus rhythm   Assessment and  Plan  Complete heart block S./P. AV nodal ablation  Pacemaker-Medtronic   She is much improved withjust scant palpitations  We discussed the use of the NOACs compared to Coumadin. We briefly reviewed the data of at least comparability in stroke prevention, bleeding and outcome. We discussed some of the new once wherein somewhat associated with decreased ischemic stroke risk, one to be taken daily, and has been shown to be comparable and bleeding risk to aspirin.  We also discussed bleeding associated with warfarin as well as NOACs and a wall bleeding as a complication of all these drugs intracranial bleeding is more frequently associated with warfarin then the NOACs and a GI bleeding is more commonly associated with the latter  She willlook into the cost and get back to [place].  I hae given her the program card for apixoban

## 2015-01-13 NOTE — Patient Instructions (Signed)
Remote monitoring is used to monitor your Pacemaker of ICD from home. This monitoring reduces the number of office visits required to check your device to one time per year. It allows [place] to keep an eye on the functioning of your device to ensure it is working properly. You are scheduled for a device check from home on 04/14/15. You may send your transmission at any time that day. If you have a wireless device, the transmission will be sent automatically. After your physician reviews your transmission, you will receive a postcard with your next transmission date.  Your physician wants you to follow-up in: 6 months with Dr. Caryl Comes. You will receive a reminder letter in the mail two months in advance. If you don't receive a letter, please call our office to schedule the follow-up appointment.

## 2015-02-04 ENCOUNTER — Ambulatory Visit (INDEPENDENT_AMBULATORY_CARE_PROVIDER_SITE_OTHER): Payer: Medicare Other

## 2015-02-04 DIAGNOSIS — Z5181 Encounter for therapeutic drug level monitoring: Secondary | ICD-10-CM | POA: Diagnosis not present

## 2015-02-04 DIAGNOSIS — Z7901 Long term (current) use of anticoagulants: Secondary | ICD-10-CM

## 2015-02-04 DIAGNOSIS — I4891 Unspecified atrial fibrillation: Secondary | ICD-10-CM

## 2015-02-04 LAB — POCT INR: INR: 3.1

## 2015-02-17 NOTE — Op Note (Signed)
PATIENT NAME:  [name], LAUMAN MR#:  [id number] DATE OF BIRTH:  May 07, 1928  DATE OF PROCEDURE:  05/28/2012  PROCEDURE PERFORMED: Cardioversion.  PREPROCEDURE DIAGNOSIS: Atrial flutter.   PROCEDURE DETAILS: Standard informed consent was obtained. A time out was performed. The patient was given sedation and propofol by Dr. Marcello Moores from anesthesia. The pads were applied in the anterior-posterior fashion. The patient was converted to sinus rhythm after one shock with 100 joules synchronized shock. She was in normal sinus rhythm. Device interrogation to follow. The patient has been on warfarin with therapeutic INR.   CONCLUSION: Successful cardioversion from atrial flutter to normal sinus rhythm.  ____________________________ Mertie Clause. Fletcher Anon, MD [id number] D: 05/28/2012 07:41:25 ET T: 05/28/2012 11:31:23 ET JOB#: [id number]  cc: Tram Wrenn A. Fletcher Anon, MD, <Dictator> Wellington Hampshire MD ELECTRONICALLY SIGNED 05/30/2012 15:25

## 2015-02-18 ENCOUNTER — Other Ambulatory Visit: Payer: Self-pay | Admitting: Physician Assistant

## 2015-02-18 DIAGNOSIS — N644 Mastodynia: Secondary | ICD-10-CM

## 2015-02-21 NOTE — Consult Note (Signed)
Pt feeling much better today, no abd pain with deep coughs.  Appetite picking up.  Agree with current management.  Electronic Signatures: Manya Silvas (MD)  (Signed on 06-Aug-15 17:43)  Authored  Last Updated: 06-Aug-15 17:43 by Manya Silvas (MD)

## 2015-02-21 NOTE — Consult Note (Signed)
Brief Consult Note: Diagnosis: abdominal pain, diarrhea.   Patient was seen by consultant.   Consult note dictated.   Comments: Appreciate consult for 81 y/o caucsasian woman for evaluation of diarrhea and nausea. recent history of left sided flank and LLQ pain- onset 10d ago after return from beach, felt it was similar to prior episode of diverticulitis, was evaluated by PCP, started on bactrim/flagyl/liquid diet x2d last Fri- then seen in GI Tues after starting regular foods: was feeling well at the time then later that night diarrhea, nausea, and loose stools started: had at least 10 stools yesterday, came to ED for evaluation & subsequently admitted. Bactrim has been stopped. On flagyl 500 iv tid for now. 4 small to med loose stools today.  Reports stools have been brown and yellow. States nausea gone, no noted antiemetic use. No pain today. Overall feeling better. Denies melena/hematochezia, vomiting, further GI related complaints. Had [place] lunch today and appears to be tolerating well. Abdomen benign on exam. Stool studies negative for c-diff, culture, CT negative for colitis/diverticulitis, acute abdominal issues. Afebrile. CBC unremarkable, hemodynamically stable. Impression and plan: Nausea, abdominal pain, diarrhea are side effects of antibiotics. Feeling better today. Did discuss her case with Dr Tiffany Kocher: 5d of combination antibiotic therapy may have been adequate for low grade diverticulitis, and all she requires. Would consider Flagyl 250mg  po tid-qid once the decision is made to change her to oral abx. Will follow with you.[name]  Electronic Signatures: Stephens November H (NP)  (Signed 06-Aug-15 16:24)  Authored: Brief Consult Note   Last Updated: 06-Aug-15 16:24 by Theodore Demark (NP)

## 2015-02-21 NOTE — Discharge Summary (Signed)
Dates of Admission and Diagnosis:  Date of Admission 05-Jun-2014   Date of Discharge 08-Jun-2014   Admitting Diagnosis Hyponatremia, diarrhea, nausea   Final Diagnosis Diverticulitis, IBS   Discharge Diagnosis 1 Diverticulitis, IBS   2 HTN   3 Atrial fibrillation s/p PPM   4 CAD    Chief Complaint/History of Present Illness 81 year old lady with h/o HTN, CAD, atrial fib s/p PPM presented with c/o diarrhea, abdominal pain and nausea, noted to be hyponatremic with serum sodium of 125.   Allergies:  Cefprozil: Anaphylaxis  Levaquin: Itching  Amoxicillin: Unknown  Omnicef Omni-Pac: Unknown  Pletal: Unknown    Thyroid:  07-Aug-15 04:20   Thyroid Stimulating Hormone  6.19 (0.45-4.50 (International Unit)  ----------------------- Pregnant patients have  different reference  ranges for TSH:  - - - - - - - - - -  Pregnant, first trimetser:  0.36 - 2.50 uIU/mL)  Hepatic:  05-Aug-15 22:50   Bilirubin, Total 0.8  Alkaline Phosphatase 105 (46-116 NOTE: New Reference Range 05/20/14)  SGPT (ALT) 40 (14-63 NOTE: New Reference Range 05/20/14)  SGOT (AST)  46  Total Protein, Serum 6.9  Albumin, Serum 3.6  09-Aug-15 03:46   Bilirubin, Total 0.7  Alkaline Phosphatase 89 (46-116 NOTE: New Reference Range 05/20/14)  SGPT (ALT) 40 (14-63 NOTE: New Reference Range 05/20/14)  SGOT (AST) 35  Total Protein, Serum  6.3  Albumin, Serum  3.2  Routine Micro:  06-Aug-15 09:08   Micro Text Report CLOSTRIDIUM DIFFICILE   C.DIFFICILE ANTIGEN       C.DIFFICILE GDH ANTIGEN : NEGATIVE   C.DIFFICILE TOXIN A/B     C.DIFFICILE TOXINS A AND B : NEGATIVE   INTERPRETATION            Negative for C. difficile.    ANTIBIOTIC                        Micro Text Report WBCS, STOOL   COMMENT                   NO RBC'S OR WBC'S SEEN   ANTIBIOTIC                       Micro Text Report STOOL COMPREHENSIVE   COMMENT                   NO SALMONELLA OR SHIGELLA ISOLATED   COMMENT                    NO PATHOGENIC E.COLI DETECTED   COMMENT                   NO CAMPYLOBACTER ANTIGEN DETECTED   ANTIBIOTIC                        Comment .1. NO RBC'S OR WBC'S SEEN  Result(s) reported on 05 Jun 2014 at 12:01PM.  Culture Comment NO SALMONELLA OR SHIGELLA ISOLATED  Culture Comment . NO PATHOGENIC E.COLI DETECTED  Culture Comment    . NO CAMPYLOBACTER ANTIGEN DETECTED  Result(s) reported on 07 Jun 2014 at 11:59AM.  Routine Chem:  05-Aug-15 22:50   Result Comment CBC - Results obtained after pre-warming  - specimen to 37 degrees celsius...tpl  Result(s) reported on 05 Jun 2014 at 12:14AM.  Glucose, Serum  181  BUN 8  Creatinine (comp) 0.96  Sodium, Serum  125  Potassium, Serum   4.0  Chloride, Serum  93  CO2, Serum 22  Calcium (Total), Serum  8.4  Osmolality (calc) 254  eGFR (African American) >60  eGFR (Non-African American)  54 (eGFR values <60mL/min/1.73 m2 may be an indication of chronic kidney disease (CKD). Calculated eGFR is useful in patients with stable renal function. The eGFR calculation will not be reliable in acutely ill patients when serum creatinine is changing rapidly. It is not useful in  patients on dialysis. The eGFR calculation may not be applicable to patients at the low and high extremes of body sizes, pregnant women, and vegetarians.)  Anion Gap 10  07-Aug-15 04:20   Sodium, Serum 136  08-Aug-15 03:47   Sodium, Serum 137  09-Aug-15 03:46   Result Comment CBC - RESULTS OBTAINED FROM SPECIMEN AFTER   - WARMING TO 37C. PLATELETS - SLIGHT PLATELET CLUMPING IN SPECIMEN. ACTUAL  - NUMERICAL COUNT MAY BE SOMEWHAT HIGHER THAN  - THE REPORTED VALUE. HGB/HCT - RESULTS VERIFIED BY REPEAT TESTING.  Result(s) reported on 08 Jun 2014 at 05:17AM.  Glucose, Serum  111  BUN 12  Creatinine (comp) 0.79  Sodium, Serum 137  Potassium, Serum  5.3  Chloride, Serum 104  CO2, Serum 25  Calcium (Total), Serum  8.4  Osmolality (calc) 274  eGFR (African American) >60   eGFR (Non-African American) >60 (eGFR values <60mL/min/1.73 m2 may be an indication of chronic kidney disease (CKD). Calculated eGFR is useful in patients with stable renal function. The eGFR calculation will not be reliable in acutely ill patients when serum creatinine is changing rapidly. It is not useful in  patients on dialysis. The eGFR calculation may not be applicable to patients at the low and high extremes of body sizes, pregnant women, and vegetarians.)  Anion Gap 8  Cardiac:  05-Aug-15 22:50   Troponin I < 0.02 (0.00-0.05 0.05 ng/mL or less: NEGATIVE  Repeat testing in 3-6 hrs  if clinically indicated. >0.05 ng/mL: POTENTIAL  MYOCARDIAL INJURY. Repeat  testing in 3-6 hrs if  clinically indicated. NOTE: An increase or decrease  of 30% or more on serial  testing suggests a  clinically important change)  Routine Coag:  05-Aug-15 22:50   Prothrombin  30.7  INR 3.1 (INR reference interval applies to patients on anticoagulant therapy. A single INR therapeutic range for coumarins is not optimal for all indications; however, the suggested range for most indications is 2.0 - 3.0. Exceptions to the INR Reference Range may include: Prosthetic heart valves, acute myocardial infarction, prevention of myocardial infarction, and combinations of aspirin and anticoagulant. The need for a higher or lower target INR must be assessed individually. Reference: The Pharmacology and Management of the Vitamin K  antagonists: the seventh ACCP Conference on Antithrombotic and Thrombolytic Therapy. Chest.2004 Sept:126 (3suppl): 2045-2335. A HCT value >55% may artifactually increase the PT.  In one study,  the increase was an average of 25%. Reference:  "Effect on Routine and Special Coagulation Testing Values of Citrate Anticoagulant Adjustment in Patients with High HCT Values." American Journal of Clinical Pathology 2006;126:400-405.)  09-Aug-15 03:46   Prothrombin  28.5  INR 2.8 (INR  reference interval applies to patients on anticoagulant therapy. A single INR therapeutic range for coumarins is not optimal for all indications; however, the suggested range for most indications is 2.0 - 3.0. Exceptions to the INR Reference Range may include: Prosthetic heart valves, acute myocardial infarction, prevention of myocardial infarction, and combinations of aspirin and anticoagulant. The need for a higher or lower target   INR must be assessed individually. Reference: The Pharmacology and Management of the Vitamin K  antagonists: the seventh ACCP Conference on Antithrombotic and Thrombolytic Therapy. Chest.2004 Sept:126 (3suppl): 2045-2335. A HCT value >55% may artifactually increase the PT.  In one study,  the increase was an average of 25%. Reference:  "Effect on Routine and Special Coagulation Testing Values of Citrate Anticoagulant Adjustment in Patients with High HCT Values." American Journal of Clinical Pathology 2006;126:400-405.)  Routine Hem:  05-Aug-15 22:50   WBC (CBC) 7.5  RBC (CBC) 4.33  Hemoglobin (CBC) 15.1  Hematocrit (CBC) 42.3  Platelet Count (CBC) 155  MCV 98  MCH  34.8  MCHC 35.7  RDW 13.3  Neutrophil % 52.3  Lymphocyte % 37.0  Monocyte % 9.4  Eosinophil % 0.5  Basophil % 0.8  Neutrophil # 3.9  Lymphocyte # 2.8  Monocyte # 0.7  Eosinophil # 0.0  Basophil # 0.1  09-Aug-15 03:46   WBC (CBC) 9.5  RBC (CBC) 4.41  Hemoglobin (CBC) 15.7  Hematocrit (CBC) 43.6  Platelet Count (CBC) 194  MCV 99  MCH  35.6  MCHC 36.0  RDW 13.7  Neutrophil % 41.2  Lymphocyte % 46.6  Monocyte % 9.4  Eosinophil % 2.2  Basophil % 0.6  Neutrophil # 3.9  Lymphocyte #  4.4  Monocyte # 0.9  Eosinophil # 0.2  Basophil # 0.1   PERTINENT RADIOLOGY STUDIES: CT:    06-Aug-15 10:37, CT Abdomen and Pelvis With Contrast  CT Abdomen and Pelvis With Contrast   REASON FOR EXAM:    (1) diarrhea and abd pain; (2) diarrhea and abd pain  COMMENTS:       PROCEDURE: CT  -  CT ABDOMEN / PELVIS  W  - Jun 05 2014 10:37AM     CLINICAL DATA:  Diarrhea and abdominal pain. History of C difficile  colitis.    EXAM:  CT ABDOMEN AND PELVIS WITH CONTRAST    TECHNIQUE:  Multidetector CT imaging of the abdomen and pelvis was performed  using the standard protocol following bolus administration of  intravenous contrast.  CONTRAST:  100 mL Isovue-300 IV    COMPARISON:  04/07/2011    FINDINGS:  There is no evidence of bowel obstruction, bowel wall thickening or  free intraperitoneal air. No free fluid or abscess is identified.    The liver, pancreas, spleen, adrenal glands and kidneys are within  normal limits. The gallbladder has been removed. No biliary ductal  dilatation is identified.    Atherosclerosis of the abdominal aorta is present without evidence  of aneurysm. No lymphadenopathy or focal masses are identified. No  evidence of hernia. The bladder is unremarkable. The uterus has been  removed. Moderate degenerative disease is identified of the lumbar  spine. There is mild loss of height of the L2 vertebral body.     IMPRESSION:  No evidence by CT of acute colitis or bowel obstruction. No acute  findings in the abdomen or pelvis.      Electronically Signed    By: Glenn  Yamagata M.D.    On: 06/05/2014 11:07         Verified By: GLENN T. YAMAGATA, M.D.,   Pertinent Past History:  Pertinent Past History Afib - s/p PPM HTN  CAD Low grade lymphoma, IgM gammopathy, cold agglutinins, hemochromatosis, hemoytic anemia   Hospital Course:  Hospital Course Patient received i.v hydration for hyponatremia likely secondary to fluid loss and diarrhea. She was started on oral Vancomycin and i.v Flagyl   to cover for C.diff. CT abdomen was negative. Stool work up was negative including C.diff, stool c/s and WBC. Patient was evaluated by GI. Impression was possible IBS/ diverticulitis. Hypertension was controlled with Norvasc. Atrial fibrillation, s/p PPM,  coumadin was continued. Therapeutic INR of 2.8 prior to discharge. Patient c/o some left flank pain. Urinalysis was negative. Patient feels much better overall. Abdominal pain and diarrhea have resolved. Shall discharge home today.   Condition on Discharge Stable   DISCHARGE INSTRUCTIONS HOME MEDS:  Medication Reconciliation: Patient's Home Medications at Discharge:     Medication Instructions  folic acid 1 mg tablet  1 tab(s) orally once a day (at bedtime)    amlodipine 5 mg tablet  1 tab(s) orally once a day    protonix 40 mg oral delayed release tablet  1 tab(s) orally once a day   alprazolam 0.25 mg oral tablet  1 tab(s) orally once a day (at bedtime), As Needed   flonase 50 mcg/inh nasal spray  2 spray(s) nasal once a day, As Needed    zyrtec 10 mg tablet  1 tab(s) orally once a day, As Needed   gabapentin 400 mg oral capsule  1 tab(s) orally once a day (at bedtime)   gabapentin 100 mg oral capsule  1 tab(s) orally once a day in the morning   warfarin 1 mg oral tablet  2 tab(s) orally    metronidazole 250 mg oral tablet  1 tab(s) orally every 8 hours     Physician's Instructions:  Diet Low Sodium   Activity Limitations As tolerated   Return to Work Not Applicable   Time frame for Follow Up Appointment 1-2 weeks  [facility]   Time frame for Follow Up Appointment 1-2 weeks  Dr. Ramonita Lab   Other Comments Advised to continue Coumadin 2 mg daily. Repeat PT/INR in 3-5 days at [facility]. Follow up with Dr. Jiles Prows, [facility] J(Attending Physician): [facility], [address] [place], [place] [address], [place]   Gaylyn Cheers T(Consultant): [facility], [address] [place], [place] [address], [place] [phone]  Electronic Signatures: Glendon Axe (MD)  (Signed 09-Aug-15 11:49)  Authored: ADMISSION DATE AND DIAGNOSIS, CHIEF COMPLAINT/HPI, Allergies, PERTINENT LABS, PERTINENT RADIOLOGY STUDIES, PERTINENT PAST HISTORY, HOSPITAL COURSE, [place], PATIENT INSTRUCTIONS, Follow Up Physician   Last Updated: 09-Aug-15 11:49 by Glendon Axe (MD)

## 2015-02-21 NOTE — H&P (Signed)
PATIENT NAME:  [name], [name] MR#:  [id number] DATE OF BIRTH:  1928/03/05  DATE OF ADMISSION:  06/05/2014   REFERRING PHYSICIAN:  Wells Guiles L. Reita Cliche, MD  PRIMARY CARE PHYSICIAN:  Adin [place], MD  PRIMARY GASTROENTEROLOGIST:  Gaylyn Cheers, MD   CHIEF COMPLAINT:  Diarrhea, abdominal pain.   HISTORY OF PRESENT ILLNESS: This is an 81 year old female with known history of AFib on anticoagulation, hemochromatosis, atypical CDL, Raynaud phenomenon, coronary artery disease and hyperlipidemia. The patient presents with complaints of abdominal pain and diarrhea, reports the patient had abdominal pain last week, where she saw her PCP who had suspicion for diverticulitis and asked her to be on a clear liquid diet for few days. As well, the patient reports on Monday, she developed diarrhea where she saw her PCP, where she was prescribed p.o. Bactrim and Flagyl by her PCP. As well, the patient was seen by Dr. Vira Agar yesterday, who decreased her Flagyl, giving her nausea. The patient denies any fever or any chills, any coffee-ground emesis, melena, or bright red blood per rectum. She reports her diarrhea has been improving over the last 24 hours. The patient had 1 loose bowel movement in the ED. The patient was febrile at that time and leukocytosis. She was noticed to have hypokalemia, which appears to be acute; most recent value was a few years ago as no recent admission or recent labs for the patient with sodium being at 137. Hospitalist requested to admit the patient for hydration and acute hyponatremia, and further evaluation for her diarrhea.   As well, the patient reports she was yesterday at [facility], where she gave them a stool sample for C. difficile.   PAST MEDICAL HISTORY:  1.  History of hemochromatosis.  2.  AFib.  3.  History of atypical CLL and CD20 positive.  4.  History of longstanding cold agglutinin.  5.  History of low-level IgM gammopathy and history of bone marrow biopsy.  6.   History of pancreatic cystic mass.  7.  History of multiple phlebotomies, which were stopped because of hemolytic anemia.  8.  History of AFib with flutter, status post pacemaker on warfarin.  9.  History of coronary artery disease.  10.  Hyperlipidemia.  11.  Hiatal hernia.  12.  Fibrocystic breast disease.  13.  Colon polyps.  14.  Cervical disk disease.  15.  Macular degeneration.  16.  Renal cysts.  17.  Raynaud phenomenon since 2003.   PAST SURGICAL HISTORY:  1.  Cardiac catheterization in the past.  2.  laproscopic  appendectomy.  3.  Breast biopsy.  4.  Pacemaker.  5.  Radioablation for AFlutter.  6.  History of cataract surgery.   SOCIAL HISTORY: No smoking. No drinking. History of previous tobacco use. The patient lives at home. Her power of attorney is her son. She has a Living Will. She is a Full Code.   FAMILY HISTORY: No history of hypertension or diabetes.   HOME MEDICATIONS:  1.  Zyrtec 10 mg oral daily.  2.  Xanax 0.25 mg at bedtime as needed.  3.  Norvasc 5 mg oral daily.  4.  Fluticasone/Flonase 2 sprays into nose daily.  5.  Folic acid 1 mg oral daily.  6.  Gabapentin 400 mg daily.  7.  Protonix 40 mg daily.  8.  Warfarin 3 mg on Monday/Wednesday/Friday, and 2 mg other days of the week.     REVIEW OF SYSTEMS:  CONSTITUTIONAL:  The patient denies  fever, chills. Reports fatigue, weakness.  EYES:  Denies blurry vision, double vision, inflammation. EARS, NOSE, AND THROAT:  Denies tinnitus, ear pain, hearing loss, epistaxis.  RESPIRATORY:  Denies cough, wheezing, hemoptysis, chronic obstructive pulmonary disease.  CARDIOVASCULAR:  Denies chest pain, orthopnea, palpitations, syncope. GASTROINTESTINAL:  Reports diarrhea, abdominal pain, nausea. Denies any vomiting, hematemesis, melena, coffee-ground emesis.  GENITOURINARY:  Denies dysuria, hematuria, renal colic.  ENDOCRINE:  Denies polyuria, polydipsia, heat or cold intolerance.  HEMATOLOGY: Denies anemia, easy  bruising, bleeding diathesis.  INTEGUMENT: Denies acne, rash, skin lesions.  MUSCULOSKELETAL: Denies any swelling, gout, arthritis, cramps.  NEUROLOGIC: Denies CVA, TIA, tremors, vertigo.  PSYCHIATRIC: Denies anxiety, insomnia, or depression.   PHYSICAL EXAMINATION:  VITAL SIGNS: Temperature 98.1, pulse 115, respiratory rate 16, blood pressure 141/56, saturating 97% on room air. Currently, the patient's pulse is 52 on the telemetry monitor.  GENERAL: Frail, elderly female who looks comfortable in bed; in no apparent distress. HEENT: Head atraumatic, normocephalic. Pupils equal and reactive to light. Pink conjunctivae. Anicteric sclerae. Moist oral mucosa.  NECK: Supple. No thyromegaly. No JVD.  CHEST: Good air entry bilaterally. No wheezing, rales, or rhonchi.  CARDIOVASCULAR: S1, S2 heard. No rubs, murmurs, or gallops. Regular rate and rhythm. Has right upper chest pacemaker.  ABDOMEN: Soft, nontender, nondistended. Bowel sounds present.  EXTREMITIES: No edema. No clubbing. No stenosis. Pedal pulses +2 bilaterally.  PSYCHIATRIC: Appropriate affect. Awake and alert x 3. Intact judgment and insight.  NEUROLOGIC: Cranial nerves grossly intact. Motor 5/5. No focal deficits.  SKIN: Normal skin turgor. Warm and dry.  MUSCULOSKELETAL: No joint effusion or erythema.  LYMPHATICS: No cervical or supraclavicular lymphadenopathy.   PERTINENT LABORATORY DATA: Glucose 181, BUN 8, creatinine 0.96, sodium 125, potassium 4, chloride 93, CO2 of 22. Troponin less than 0.02. ALT 40, AST 46, alkaline phosphatase 105. White blood cells 7.5, hemoglobin 15.1, hematocrit 42.3, platelets 155,000. INR 3.1. Urinalysis negative for leukocyte esterase and nitrite.   ASSESSMENT AND PLAN:  1.  Hyponatremia. Appears to be acute. The patient presents with weakness. She will be admitted for hydration. This is most likely due to volume depletion secondary to her diarrhea and decreased p.o. intake. Continue with IV normal  saline. We will monitor level closely.  2.  Abdominal pain and diarrhea. We will need to rule out C. difficile. Will send stool sample. We will check, as well, culture and smear, and white blood cells. Will add p.o. vancomycin and IV Flagyl to treat empirically for C. difficile, given her history. We will check CT abdomen and pelvis to rule out any diverticulitis, given her earlier abdominal pain.  3.  Atrial fibrillation. Patient to be admitted to the telemetry unit. Appears to be rate controlled. To consult pharmacy to dose warfarin.  4.  Hypertension. Blood pressure is acceptable. Continue with Norvasc.  5.  Deep vein thrombosis prophylaxis. The patient is on full dose anticoagulation with warfarin. Code Status: Full Code.  6.  Healthcare Power of Attorney: The patient has a Living Will.   TOTAL TIME SPENT ON ADMISSION AND PATIENT CARE: 55 minutes     ____________________________ Albertine Patricia, MD dse:ms D: 06/05/2014 03:46:31 ET T: 06/05/2014 05:31:43 ET JOB#: [id number]  cc: Albertine Patricia, MD, <Dictator> DAWOOD Graciela Husbands MD ELECTRONICALLY SIGNED 06/06/2014 7:29

## 2015-02-21 NOTE — Consult Note (Signed)
Pt waxes and wanes about abd symptoms.  Will have days of looser stools then days of formed stool.  Discomfort comes and goes, maybe better with bowel movements.  She has nausea spells that come and go also.  At this moment no abd tenderness on palpation. Doubt diverticulitis but possible.  Could be some component of IBS, also lactose intolerance ? half an Imodium pill bid is a possible treatment, she requests meds for nausea when she goes home, generic Zofran least likely to cause side effects.  Electronic Signatures: Manya Silvas (MD)  (Signed on 07-Aug-15 17:27)  Authored  Last Updated: 07-Aug-15 17:27 by Manya Silvas (MD)

## 2015-02-21 NOTE — Consult Note (Signed)
Dr. Gustavo Lah on this weekend, call if any problems you need assistance with, he will not see her unless called.  Electronic Signatures: Manya Silvas (MD)  (Signed on 07-Aug-15 17:32)  Authored  Last Updated: 07-Aug-15 17:32 by Manya Silvas (MD)

## 2015-02-21 NOTE — Consult Note (Signed)
PATIENT NAME:  [name], [name] MR#:  [id number] DATE OF BIRTH:  1927-12-09  DATE OF CONSULTATION:  06/05/2014  REFERRING PHYSICIAN:  Dr. Caryl Comes. CONSULTING PHYSICIAN:  Theodore Demark, NP  HISTORY OF PRESENT ILLNESS: Appreciate consult for an 81 year old Caucasian woman for evaluation of diarrhea and nausea, recent history of left-sided flank and left lower quadrant pain, onset 10 days ago after return from the beach. Felt it was similar to prior episode of diverticulitis, was evaluated by her primary care physician and started on Bactrim and Flagyl and liquid diet for 2 days last Friday, then seen in the GI clinic Tuesday after starting regular foods. At that time, reports feeling well, but then later that night started with diarrhea and nausea, some abdominal discomfort. States she passed at least 10 stools yesterday. Came to the Emergency Department for evaluation and was subsequently admitted. Bactrim has been stopped now. She is on IV Flagyl 500 p.o. t.i.d. She has had 4 small to medium loose stools today. Reports stools have been brown and yellow. States her nausea is gone. Has not needed any antiemetics do not see any given on the [facility] today. States no pain today. Overall feeling better. Denies melena, hematochezia, vomiting, further GI-related complaints.  Had a full liquid lunch tray for lunch,  and says she is tolerating this well. She has had stool studies negative for Clostridium difficile, negative for WBC, negative for RBC. Culture so far is negative for Campylobacter. She has had a contrasted abdominal CT that was negative for colitis/diverticulitis, and other acute abdominal issues. She is afebrile. Her CBC was unremarkable. She is hemodynamically stable.   PAST MEDICAL HISTORY: Hemochromatosis; atrial fibrillation; CLL, CD20 positive, cold agglutinins, low-level IgM gammopathy and history of bone marrow biopsy; history of pancreatic cyst; multiple phlebotomies for her hemochromatosis, has  not required this recently; pacemaker placement, warfarin therapy for her atrial fibrillation; CAD; hyperlipidemia; hiatal hernia; fibrocystic breast disease; adenomatous colon polyps; cervical disk disease; macular degeneration; renal cysts; Raynaud's; cardiac catheterization; appendectomy; breast biopsy;  radio ablation for atrial flutter; and cataract surgery.   SOCIAL HISTORY: No tobacco or EtOH. Lives at home. Son is her power of attorney. No illicit's.   FAMILY HISTORY: Significant for diabetes, stomach cancer, and pancreatic cancer.   HOME MEDICATIONS: Zyrtec 10 mg p.o. daily, Xanax 0.25 mg at bedtime p.r.n., Norvasc 5 mg p.o. daily, Flonase 2 sprays in the nose daily, folic acid 1 mg p.o. daily, gabapentin 400 mg p.o. daily, Protonix 40 mg p.o. daily, warfarin 3 mg M, W, F and 2 mg Tuesday, Thursday, Saturday, and Sunday.   REVIEW OF SYSTEMS: Ten systems reviewed. Significant for fatigue, weakness; otherwise, currently unremarkable.   ALLERGIES: AMOXICILLIN, CEFPROZIL, LEVAQUIN, OMNI PACK, PLETAL, PLAVIX.   MOST RECENT LABORATORIES: Glucose 181, BUN 8, creatinine 0.96, sodium 125, potassium 4, chloride 93, GFR 54, calcium 8.4, total protein 6.9, albumin 3.6, total bilirubin 0.8, ALP 105, AST 46, ALT 40. Troponin less than 0.02. WBC 7.5, hemoglobin 15.1, hematocrit 42.3, platelet count 155,000. PT-INR 27.4/2.6. Stool studies as noted above.   CT scan: No evidence by CT of acute colitis or bowel obstruction. No acute findings in the abdomen or pelvis.   PHYSICAL EXAMINATION:  VITAL SIGNS: Most recent: Temperature 98.2, pulse 72, respiratory rate 20, blood pressure 137/62, oxygen saturation 94% on room air.  GENERAL: Well-appearing elderly woman, who appears comfortable in bed in no acute distress.  HEENT: Normocephalic, atraumatic. Sclerae are clear. Conjunctivae pink. Mucous membranes moist.  NECK: Supple.  No thyromegaly. No JVD.  CHEST: Respirations eupneic. Lungs clear.  CARDIAC: S1,  S2, RRR. No MRG. No appreciable edema.  ABDOMEN: Flat. Bowel sounds x 4, nondistended, nontender. No guarding, rigidity, peritoneal signs, hepatosplenomegaly, masses, or other abnormalities.  EXTREMITIES: No edema. MAEW x 4. Strength 5/5. Sensation appears to be intact.  SKIN: Warm, dry, pink. No erythema, lesion or rash.  NEUROLOGICAL: Alert and oriented x 3. Cranial nerves II-XII intact. Speech clear. No facial droop.  PSYCHIATRIC: Pleasant, calm, cooperative.   IMPRESSION AND PLAN: Nausea, abdominal pain, and side diarrhea. These can be side effects of antibiotics. She is feeling better today. I did discuss her case with Dr. Vira Agar.  Five days a combination antibiotic therapy may have been adequate for low-grade diverticulitis, and all she requires. We will consider Flagyl 250 p.o. 3 times a day to 4 times a day once the decision is made to change her to oral antibiotics. Do recommend a bland to full liquid diet gradually advancing to low residue as tolerated.   Thank you very much for this consult. We will follow with you.   These services were provided by Stephens November, MSN, [facility], in collaboration with Dr. Manya Silvas with whom I have discussed this patient in full.    ____________________________ Theodore Demark, NP chl:ts D: 06/05/2014 16:37:10 ET T: 06/05/2014 16:49:07 ET JOB#: [id number]  cc: Theodore Demark, NP, <Dictator> [place] SIGNED 06/11/2014 12:51

## 2015-03-04 ENCOUNTER — Ambulatory Visit
Admission: RE | Admit: 2015-03-04 | Discharge: 2015-03-04 | Disposition: A | Discharge status: Home / Self Care | Payer: Medicare Other | Source: Ambulatory Visit | Attending: Physician Assistant | Admitting: Physician Assistant

## 2015-03-04 ENCOUNTER — Ambulatory Visit (INDEPENDENT_AMBULATORY_CARE_PROVIDER_SITE_OTHER): Payer: Medicare Other

## 2015-03-04 ENCOUNTER — Ambulatory Visit: Payer: Medicare Other

## 2015-03-04 DIAGNOSIS — I4891 Unspecified atrial fibrillation: Secondary | ICD-10-CM | POA: Diagnosis not present

## 2015-03-04 DIAGNOSIS — Z7901 Long term (current) use of anticoagulants: Secondary | ICD-10-CM

## 2015-03-04 DIAGNOSIS — Z5181 Encounter for therapeutic drug level monitoring: Secondary | ICD-10-CM

## 2015-03-04 DIAGNOSIS — N644 Mastodynia: Secondary | ICD-10-CM | POA: Diagnosis not present

## 2015-03-04 LAB — POCT INR: INR: 2.8

## 2015-04-01 ENCOUNTER — Ambulatory Visit (INDEPENDENT_AMBULATORY_CARE_PROVIDER_SITE_OTHER): Payer: Medicare Other

## 2015-04-01 DIAGNOSIS — Z5181 Encounter for therapeutic drug level monitoring: Secondary | ICD-10-CM | POA: Diagnosis not present

## 2015-04-01 DIAGNOSIS — I4891 Unspecified atrial fibrillation: Secondary | ICD-10-CM

## 2015-04-01 DIAGNOSIS — Z7901 Long term (current) use of anticoagulants: Secondary | ICD-10-CM

## 2015-04-01 LAB — POCT INR: INR: 2.3

## 2015-04-14 ENCOUNTER — Ambulatory Visit (INDEPENDENT_AMBULATORY_CARE_PROVIDER_SITE_OTHER): Payer: Medicare Other | Admitting: *Deleted

## 2015-04-14 ENCOUNTER — Encounter: Payer: Self-pay | Admitting: Internal Medicine

## 2015-04-14 DIAGNOSIS — I442 Atrioventricular block, complete: Secondary | ICD-10-CM | POA: Diagnosis not present

## 2015-04-14 NOTE — Progress Notes (Signed)
Remote pacemaker transmission.   

## 2015-04-19 LAB — CUP PACEART REMOTE DEVICE CHECK
Battery Remaining Longevity: 108 mo
Battery Voltage: 2.79 V
Brady Statistic AP VP Percent: 44 %
Brady Statistic AS VS Percent: 0 %
Date Time Interrogation Session: 20160614141554
Lead Channel Impedance Value: 439 Ohm
Lead Channel Impedance Value: 531 Ohm
Lead Channel Sensing Intrinsic Amplitude: 1.4 mV
Lead Channel Setting Pacing Amplitude: 2.5 V
Lead Channel Setting Pacing Pulse Width: 0.4 ms
MDC IDC MSMT BATTERY IMPEDANCE: 183 Ohm
MDC IDC SET LEADCHNL RA PACING AMPLITUDE: 2.5 V
MDC IDC SET LEADCHNL RV SENSING SENSITIVITY: 5.6 mV
MDC IDC STAT BRADY AP VS PERCENT: 0 %
MDC IDC STAT BRADY AS VP PERCENT: 56 %

## 2015-04-29 ENCOUNTER — Ambulatory Visit (INDEPENDENT_AMBULATORY_CARE_PROVIDER_SITE_OTHER): Payer: Medicare Other

## 2015-04-29 DIAGNOSIS — Z7901 Long term (current) use of anticoagulants: Secondary | ICD-10-CM | POA: Diagnosis not present

## 2015-04-29 DIAGNOSIS — Z5181 Encounter for therapeutic drug level monitoring: Secondary | ICD-10-CM

## 2015-04-29 DIAGNOSIS — I4891 Unspecified atrial fibrillation: Secondary | ICD-10-CM | POA: Diagnosis not present

## 2015-04-29 LAB — POCT INR: INR: 2.5

## 2015-05-01 ENCOUNTER — Other Ambulatory Visit: Payer: Self-pay | Admitting: Cardiovascular Disease

## 2015-05-01 NOTE — Telephone Encounter (Signed)
Please review for refill. Thanks!  

## 2015-05-08 ENCOUNTER — Encounter: Payer: Self-pay | Admitting: Cardiology

## 2015-05-21 ENCOUNTER — Encounter: Payer: Self-pay | Admitting: Internal Medicine

## 2015-06-10 ENCOUNTER — Ambulatory Visit (INDEPENDENT_AMBULATORY_CARE_PROVIDER_SITE_OTHER): Payer: Medicare Other | Admitting: *Deleted

## 2015-06-10 DIAGNOSIS — Z7901 Long term (current) use of anticoagulants: Secondary | ICD-10-CM | POA: Diagnosis not present

## 2015-06-10 DIAGNOSIS — I4891 Unspecified atrial fibrillation: Secondary | ICD-10-CM | POA: Diagnosis not present

## 2015-06-10 DIAGNOSIS — Z5181 Encounter for therapeutic drug level monitoring: Secondary | ICD-10-CM

## 2015-06-10 LAB — POCT INR: INR: 2.7

## 2015-07-14 ENCOUNTER — Encounter: Payer: Self-pay | Admitting: Internal Medicine

## 2015-07-14 ENCOUNTER — Telehealth: Payer: Self-pay | Admitting: Cardiology

## 2015-07-14 ENCOUNTER — Ambulatory Visit (INDEPENDENT_AMBULATORY_CARE_PROVIDER_SITE_OTHER): Payer: Medicare Other | Admitting: *Deleted

## 2015-07-14 DIAGNOSIS — I442 Atrioventricular block, complete: Secondary | ICD-10-CM

## 2015-07-14 NOTE — Telephone Encounter (Signed)
Attempted to confirm remote transmission with pt. No answer and was unable to leave a message.   

## 2015-07-15 NOTE — Progress Notes (Signed)
Remote pacemaker transmission.   

## 2015-07-22 ENCOUNTER — Ambulatory Visit (INDEPENDENT_AMBULATORY_CARE_PROVIDER_SITE_OTHER): Payer: Medicare Other

## 2015-07-22 DIAGNOSIS — Z7901 Long term (current) use of anticoagulants: Secondary | ICD-10-CM

## 2015-07-22 DIAGNOSIS — I4891 Unspecified atrial fibrillation: Secondary | ICD-10-CM | POA: Diagnosis not present

## 2015-07-22 DIAGNOSIS — Z5181 Encounter for therapeutic drug level monitoring: Secondary | ICD-10-CM

## 2015-07-22 LAB — POCT INR: INR: 2.5

## 2015-07-23 LAB — CUP PACEART REMOTE DEVICE CHECK
Battery Impedance: 231 Ohm
Battery Remaining Longevity: 101 mo
Battery Voltage: 2.79 V
Brady Statistic AP VP Percent: 45 %
Date Time Interrogation Session: 20160913165633
Lead Channel Impedance Value: 531 Ohm
Lead Channel Setting Pacing Amplitude: 2.5 V
Lead Channel Setting Sensing Sensitivity: 5.6 mV
MDC IDC MSMT LEADCHNL RA IMPEDANCE VALUE: 450 Ohm
MDC IDC MSMT LEADCHNL RA SENSING INTR AMPL: 1 mV
MDC IDC SET LEADCHNL RV PACING AMPLITUDE: 2.5 V
MDC IDC SET LEADCHNL RV PACING PULSEWIDTH: 0.4 ms
MDC IDC STAT BRADY AP VS PERCENT: 0 %
MDC IDC STAT BRADY AS VP PERCENT: 54 %
MDC IDC STAT BRADY AS VS PERCENT: 1 %

## 2015-08-11 ENCOUNTER — Encounter: Payer: Self-pay | Admitting: Cardiology

## 2015-09-02 ENCOUNTER — Ambulatory Visit (INDEPENDENT_AMBULATORY_CARE_PROVIDER_SITE_OTHER): Payer: Medicare Other

## 2015-09-02 DIAGNOSIS — Z7901 Long term (current) use of anticoagulants: Secondary | ICD-10-CM

## 2015-09-02 DIAGNOSIS — Z5181 Encounter for therapeutic drug level monitoring: Secondary | ICD-10-CM

## 2015-09-02 DIAGNOSIS — I4891 Unspecified atrial fibrillation: Secondary | ICD-10-CM

## 2015-09-02 LAB — POCT INR: INR: 2.9

## 2015-10-07 ENCOUNTER — Other Ambulatory Visit: Payer: Self-pay | Admitting: *Deleted

## 2015-10-07 MED ORDER — WARFARIN SODIUM 2 MG PO TABS: 2.0000 mg | ORAL_TABLET | ORAL | Status: DC

## 2015-10-07 MED ORDER — WARFARIN SODIUM 3 MG PO TABS: ORAL_TABLET | ORAL | Status: DC

## 2015-10-07 NOTE — Telephone Encounter (Signed)
Spoke with pt and she states needs refill on coumadin 2mg  tablets and Coumadin  3mg  tablets as well so these refills done as requested

## 2015-10-13 ENCOUNTER — Ambulatory Visit (INDEPENDENT_AMBULATORY_CARE_PROVIDER_SITE_OTHER): Payer: Medicare Other | Admitting: *Deleted

## 2015-10-13 DIAGNOSIS — I442 Atrioventricular block, complete: Secondary | ICD-10-CM

## 2015-10-13 NOTE — Progress Notes (Signed)
Remote pacemaker transmission.   

## 2015-10-14 ENCOUNTER — Ambulatory Visit (INDEPENDENT_AMBULATORY_CARE_PROVIDER_SITE_OTHER): Payer: Medicare Other

## 2015-10-14 DIAGNOSIS — Z5181 Encounter for therapeutic drug level monitoring: Secondary | ICD-10-CM | POA: Diagnosis not present

## 2015-10-14 DIAGNOSIS — I4891 Unspecified atrial fibrillation: Secondary | ICD-10-CM | POA: Diagnosis not present

## 2015-10-14 DIAGNOSIS — Z7901 Long term (current) use of anticoagulants: Secondary | ICD-10-CM | POA: Diagnosis not present

## 2015-10-14 LAB — POCT INR: INR: 2.7

## 2015-10-17 LAB — CUP PACEART REMOTE DEVICE CHECK
Battery Remaining Longevity: 100 mo
Brady Statistic AS VP Percent: 53 %
Brady Statistic AS VS Percent: 2 %
Implantable Lead Implant Date: 20000718
Implantable Lead Location: 753859
Lead Channel Impedance Value: 444 Ohm
Lead Channel Impedance Value: 475 Ohm
Lead Channel Sensing Intrinsic Amplitude: 1.4 mV
Lead Channel Setting Pacing Pulse Width: 0.4 ms
MDC IDC LEAD IMPLANT DT: 20000718
MDC IDC LEAD LOCATION: 753860
MDC IDC MSMT BATTERY IMPEDANCE: 231 Ohm
MDC IDC MSMT BATTERY VOLTAGE: 2.79 V
MDC IDC SESS DTM: 20161213134010
MDC IDC SET LEADCHNL RA PACING AMPLITUDE: 2.5 V
MDC IDC SET LEADCHNL RV PACING AMPLITUDE: 2.5 V
MDC IDC SET LEADCHNL RV SENSING SENSITIVITY: 5.6 mV
MDC IDC STAT BRADY AP VP PERCENT: 46 %
MDC IDC STAT BRADY AP VS PERCENT: 0 %

## 2015-10-22 ENCOUNTER — Encounter: Payer: Self-pay | Admitting: *Deleted

## 2015-11-25 ENCOUNTER — Ambulatory Visit (INDEPENDENT_AMBULATORY_CARE_PROVIDER_SITE_OTHER): Payer: Medicare Other

## 2015-11-25 DIAGNOSIS — I4891 Unspecified atrial fibrillation: Secondary | ICD-10-CM | POA: Diagnosis not present

## 2015-11-25 DIAGNOSIS — Z5181 Encounter for therapeutic drug level monitoring: Secondary | ICD-10-CM | POA: Diagnosis not present

## 2015-11-25 DIAGNOSIS — Z7901 Long term (current) use of anticoagulants: Secondary | ICD-10-CM

## 2015-11-25 LAB — POCT INR: INR: 2.5

## 2015-11-26 DIAGNOSIS — E559 Vitamin D deficiency, unspecified: Secondary | ICD-10-CM | POA: Insufficient documentation

## 2015-11-26 DIAGNOSIS — R739 Hyperglycemia, unspecified: Secondary | ICD-10-CM | POA: Insufficient documentation

## 2015-12-11 ENCOUNTER — Other Ambulatory Visit: Payer: Self-pay | Admitting: *Deleted

## 2016-01-06 ENCOUNTER — Ambulatory Visit (INDEPENDENT_AMBULATORY_CARE_PROVIDER_SITE_OTHER): Payer: Medicare Other

## 2016-01-06 DIAGNOSIS — Z7901 Long term (current) use of anticoagulants: Secondary | ICD-10-CM

## 2016-01-06 DIAGNOSIS — I4891 Unspecified atrial fibrillation: Secondary | ICD-10-CM

## 2016-01-06 DIAGNOSIS — Z5181 Encounter for therapeutic drug level monitoring: Secondary | ICD-10-CM | POA: Diagnosis not present

## 2016-01-06 LAB — POCT INR: INR: 3

## 2016-01-19 ENCOUNTER — Ambulatory Visit (INDEPENDENT_AMBULATORY_CARE_PROVIDER_SITE_OTHER): Payer: Medicare Other | Admitting: Internal Medicine

## 2016-01-19 ENCOUNTER — Encounter: Payer: Self-pay | Admitting: Internal Medicine

## 2016-01-19 VITALS — BP 126/68 | HR 61 | Ht 62.0 in | Wt 129.5 lb

## 2016-01-19 DIAGNOSIS — I442 Atrioventricular block, complete: Secondary | ICD-10-CM

## 2016-01-19 DIAGNOSIS — I4891 Unspecified atrial fibrillation: Secondary | ICD-10-CM | POA: Diagnosis not present

## 2016-01-19 NOTE — Progress Notes (Signed)
Patient Care Team: Adin Hector, MD as PCP - General (Internal Medicine)   HPI  [name] is a 82 y.o. female With a history of permanent atrial fibrillation status post AV junction ablation for uncontrolled ventricular response this subsequently implanted pacemaker. She underwent generator replacement February 2014 which was inadvertently associated with failure to activate rate response. This was subsequently activated: Some degree of overresponsiveness causing her to have a sensation of tachypalpitations which was addressed by reprogramming.   She has a history of remote catheterization in the 1990s. At that time her arteries were clean. His stress test in 2001 that was nonischemic and Myoview 2014 that was unrevealing  echocardiogram 2013 demonstrated normal LV function  She also has a history of GE reflux disease.   She has done quite well. She notes that her heart rate sometimes flutters when she finishes activity. She also has fatigue the day following exercise.   Past Medical History  Diagnosis Date  . Non Hodgkin's lymphoma ([place])     stable; followed by an oncologist  . Hyperlipidemia   . Hypertension   . Hereditary hemochromatosis     Phlebotomized in the past, not getting phlebotomy now  . GERD (gastroesophageal reflux disease)   . Complete heart block  s/p av ablation     with atrial fibrillation. Pt is s/p AV nodal ablation due to permanent and symptomatic atrial fibrillation.   . Coronary artery disease     Nonobstructive  . C. difficile colitis     feces transplant  . Atrial fibrillation ([place]) 07/20/2009    Qualifier: Diagnosis of  By: Caryl Comes, MD, Leonidas Romberg Mack Guise   . Pacemaker-dual-chamber-Medtronic 04/04/2011  . 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 Diverticulitis   . Osteoarthritis     Past Surgical History  Procedure Laterality Date  . Cardiac catheterization    . Abdominal hysterectomy    . Cholecystectomy    . Atrial ablation surgery  2008     AV nodal ablation  . US echocardiography  07/2008    EF 55%, mild LVH. No significant valvular abnormalities  . Colonoscopy  oct.12, 2012  . Stool transplant    . Cardioversion    . Insert / replace / remove pacemaker    . Pacemaker generator change N/A 12/17/2012    Procedure: PACEMAKER GENERATOR CHANGE;  Surgeon: Deboraha Sprang, MD;  Location: [facility] CATH LAB;  Service: Cardiovascular;  Laterality: N/A;  . Breast biopsy  benign    Current Outpatient Prescriptions  Medication Sig Dispense Refill  . alendronate (FOSAMAX) 70 MG tablet Take 70 mg by mouth every 7 (seven) days. Takes on Tuesdays. Take with a full glass of water on an empty stomach.    . ALPRAZolam (XANAX) 0.25 MG tablet Take 0.5 mg by mouth at bedtime as needed for sleep.    [name] amLODipine (NORVASC) 5 MG tablet Take 1 tablet (5 mg total) by mouth daily. 30 tablet 6  . Cetirizine HCl (ZYRTEC ALLERGY PO) Take by mouth daily.     . fluticasone (FLONASE) 50 MCG/ACT nasal spray Place 2 sprays into the nose daily as needed for allergies.     . folic acid (FOLVITE) 1 MG tablet Take 1 mg by mouth daily.      [name] gabapentin (NEURONTIN) 100 MG capsule Take 100 mg by mouth. Takes 1 tablet in the am daily.    [name] gabapentin (NEURONTIN) 400 MG capsule Take 400 mg by mouth at bedtime.     [name]  Pantoprazole Sodium (PROTONIX PO) Take 40 mg by mouth daily.    . polyvinyl alcohol (LIQUIFILM TEARS) 1.4 % ophthalmic solution Place 2 drops into both eyes 3 (three) times daily as needed (for dry eyes).    . Vitamin D, Ergocalciferol, (DRISDOL) 50000 UNITS CAPS capsule Take 50,000 Units by mouth every 7 (seven) days.    [name] warfarin (COUMADIN) 2 MG tablet Take 1 tablet (2 mg total) by mouth See admin instructions. 30 tablet 3  . warfarin (COUMADIN) 3 MG tablet Take as directed by coumadin clinic 20 tablet 1   No current facility-administered medications for this visit.    Allergies  Allergen Reactions  . Amoxicillin   . Cefdinir Diarrhea  . Cilostazol      Other reaction(s): Headache  . Levofloxacin   . Plavix [Clopidogrel Bisulfate]     12/12/11 patient reports she has never taken Plavix before.     Review of Systems negative except from HPI and PMH  Physical Exam BP 126/68 mmHg  Pulse 61  Ht 5\' 2"  (1.575 m)  Wt 129 lb 8 oz (58.741 kg)  BMI 23.68 kg/m2 Well developed and nourished in no acute distress HENT normal Neck supple with JVP-flat Clear Regular rate and rhythm, no murmurs or gallops Abd-soft with active BS No Clubbing cyanosis edema Skin-warm and dry A & Oriented  Grossly normal sensory and motor function  ECG demonstrates ventricular pacing with complete heart block and underlying sinus rhythm   Assessment and  Plan  Complete heart block S./P. AV nodal ablation  Atrial fibrillation permanent  We have increased her ADL rate from 1-2 and changed her deceleration time for exercise--2.5.

## 2016-01-19 NOTE — Patient Instructions (Signed)
Medication Instructions: - Your physician recommends that you continue on your current medications as directed. Please refer to the Current Medication list given to you today.  Labwork: - none  Procedures/Testing: - none  Follow-Up: - Remote monitoring is used to monitor your Pacemaker of ICD from home. This monitoring reduces the number of office visits required to check your device to one time per year. It allows us to keep an eye on the functioning of your device to ensure it is working properly. You are scheduled for a device check from home on 04/19/16. You may send your transmission at any time that day. If you have a wireless device, the transmission will be sent automatically. After your physician reviews your transmission, you will receive a postcard with your next transmission date.  - Your physician wants you to follow-up in: 1 year with Dr. Klein. You will receive a reminder letter in the mail two months in advance. If you don't receive a letter, please call our office to schedule the follow-up appointment.  Any Additional Special Instructions Will Be Listed Below (If Applicable).     If you need a refill on your cardiac medications before your next appointment, please call your pharmacy.   

## 2016-01-26 ENCOUNTER — Encounter: Payer: Self-pay | Admitting: Internal Medicine

## 2016-01-28 LAB — CUP PACEART INCLINIC DEVICE CHECK
Battery Impedance: 231 Ohm
Brady Statistic RV Percent Paced: 99 %
Date Time Interrogation Session: 20170321141114
Implantable Lead Implant Date: 20000718
Implantable Lead Implant Date: 20000718
Implantable Lead Location: 753859
Lead Channel Sensing Intrinsic Amplitude: 1.4 mV
Lead Channel Setting Pacing Amplitude: 2.5 V
Lead Channel Setting Pacing Pulse Width: 0.4 ms
MDC IDC LEAD LOCATION: 753860
MDC IDC MSMT BATTERY REMAINING LONGEVITY: 100 mo
MDC IDC MSMT BATTERY VOLTAGE: 2.78 V
MDC IDC MSMT LEADCHNL RA IMPEDANCE VALUE: 438 Ohm
MDC IDC MSMT LEADCHNL RV IMPEDANCE VALUE: 490 Ohm
MDC IDC MSMT LEADCHNL RV PACING THRESHOLD AMPLITUDE: 1 V
MDC IDC MSMT LEADCHNL RV PACING THRESHOLD PULSEWIDTH: 0.4 ms
MDC IDC SET LEADCHNL RV SENSING SENSITIVITY: 5.6 mV

## 2016-02-17 ENCOUNTER — Ambulatory Visit (INDEPENDENT_AMBULATORY_CARE_PROVIDER_SITE_OTHER): Payer: Medicare Other

## 2016-02-17 DIAGNOSIS — Z5181 Encounter for therapeutic drug level monitoring: Secondary | ICD-10-CM | POA: Diagnosis not present

## 2016-02-17 DIAGNOSIS — I4891 Unspecified atrial fibrillation: Secondary | ICD-10-CM | POA: Diagnosis not present

## 2016-02-17 DIAGNOSIS — Z7901 Long term (current) use of anticoagulants: Secondary | ICD-10-CM

## 2016-02-17 LAB — POCT INR: INR: 2.8

## 2016-02-25 ENCOUNTER — Telehealth: Payer: Self-pay | Admitting: Internal Medicine

## 2016-02-25 NOTE — Telephone Encounter (Signed)
I called and spoke with the patient. She is having weakness, but states she has been having this "a long time." She doesn't feel exactly like she is having chest pain, but states this feels more like indigestion/ gas. She has not tried any TUMS/ antacid. I have advised her to try this for symptoms and she states she will try this. She states she called back to the office this afternoon and an appointment was made for 03/08/16 with Dr. Caryl Comes. I have advised her if her symptoms worsen, to please call back. She is agreeable.

## 2016-02-25 NOTE — Telephone Encounter (Signed)
Pt c/o of Chest Pain: STAT if CP now or developed within 24 hours  1. Are you having CP right now? No   2. Are you experiencing any other symptoms (ex. SOB, nausea, vomiting, sweating)? Weakness tiredness all the time   3. How long have you been experiencing CP? 3-4 days   4. Is your CP continuous or coming and going?  Comes and goes   5. Have you taken Nitroglycerin?   No   ?  Has aching feeling in chest burping a lot and feels  weak

## 2016-02-25 NOTE — Telephone Encounter (Signed)
Pt states she called earlier to speak with Alvis Lemmings, RN regarding an  appointment w/Dr. Caryl Comes. Reports being more tired since 3/21 OV. This started about 2 weeks ago along with "some burping and a little hurting in my chest. It's nothing bad. I would like to talk to him to see what he thinks." Denies chest, left arm, jaw or back pain as well as no nausea, vomiting, diaphoresis. She reports symptoms as chest aching "like when you have indigestion". Reviewed s/s that would need immediate attention in an ER setting. Pt verbalized understanding and states she believes it is indigestion but wants to see Dr. Caryl Comes to discuss "being so tired". States when she was here on 3/21, she "hardly got to see Dr. Caryl Comes. That girl was in there checking my device most of the time".  Pt agreeable w/plan and understands when she would need to be seen in the ER. Pt had no further questions. Forward to scheduling for appt.

## 2016-03-07 NOTE — Progress Notes (Signed)
Patient Care Team: Adin Hector, MD as PCP - General (Internal Medicine)   HPI  [name] is a 82 y.o. female With a history of permanent atrial fibrillation status post AV junction ablation for uncontrolled ventricular response this subsequently implanted pacemaker. She underwent generator replacement February 2014 which was inadvertently associated with failure to activate rate response. This was subsequently activated: there has  Been  overresponsiveness of her sensor causing her to have a sensation of tachypalpitations which was addressed by reprogramming.   She now comes in complaining of significant fatigue over the last 6 months or so. There are no medication changes.  This is accompanied by some shortness of breath. This is worse as the day goes on.  She has a history of remote catheterization in the 1990s. At that time her arteries were clean.   stress test in 2001 that was nonischemic and Myoview 2014 that was unrevealing;  echocardiogram 2013 demonstrated normal LV function  She also has a history of GE reflux disease. She's had a few episodes of chest discomfort that has been unassociated with exertion.   She takes amlodipine for "circulation;" she has not had problems with blood pressure  Past Medical History  Diagnosis Date  . Non Hodgkin's lymphoma ([place])     stable; followed by an oncologist  . Hyperlipidemia   . Hypertension   . Hereditary hemochromatosis     Phlebotomized in the past, not getting phlebotomy now  . GERD (gastroesophageal reflux disease)   . Complete heart block  s/p av ablation     with atrial fibrillation. Pt is s/p AV nodal ablation due to permanent and symptomatic atrial fibrillation.   . Coronary artery disease     Nonobstructive  . C. difficile colitis     feces transplant  . Atrial fibrillation ([place]) 07/20/2009    Qualifier: Diagnosis of  By: Caryl Comes, MD, Leonidas Romberg Mack Guise   . Pacemaker-dual-chamber-Medtronic 04/04/2011  .  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 Diverticulitis   . Osteoarthritis     Past Surgical History  Procedure Laterality Date  . Cardiac catheterization    . Abdominal hysterectomy    . Cholecystectomy    . Atrial ablation surgery  2008    AV nodal ablation  . US echocardiography  07/2008    EF 55%, mild LVH. No significant valvular abnormalities  . Colonoscopy  oct.12, 2012  . Stool transplant    . Cardioversion    . Insert / replace / remove pacemaker    . Pacemaker generator change N/A 12/17/2012    Procedure: PACEMAKER GENERATOR CHANGE;  Surgeon: Deboraha Sprang, MD;  Location: [facility] CATH LAB;  Service: Cardiovascular;  Laterality: N/A;  . Breast biopsy  benign    Current Outpatient Prescriptions  Medication Sig Dispense Refill  . alendronate (FOSAMAX) 70 MG tablet Take 70 mg by mouth every 7 (seven) days. Takes on Tuesdays. Take with a full glass of water on an empty stomach.    . ALPRAZolam (XANAX) 0.25 MG tablet Take 0.5 mg by mouth at bedtime as needed for sleep.    [name] amLODipine (NORVASC) 5 MG tablet Take 1 tablet (5 mg total) by mouth daily. 30 tablet 6  . Cetirizine HCl (ZYRTEC ALLERGY PO) Take by mouth daily.     . fluticasone (FLONASE) 50 MCG/ACT nasal spray Place 2 sprays into the nose daily as needed for allergies.     . folic acid (FOLVITE) 1 MG tablet Take  1 mg by mouth daily.      [name] gabapentin (NEURONTIN) 100 MG capsule Take 100 mg by mouth. Takes 1 tablet in the am daily.    [name] gabapentin (NEURONTIN) 400 MG capsule Take 400 mg by mouth at bedtime.     . Pantoprazole Sodium (PROTONIX PO) Take 40 mg by mouth daily.    . polyvinyl alcohol (LIQUIFILM TEARS) 1.4 % ophthalmic solution Place 2 drops into both eyes 3 (three) times daily as needed (for dry eyes).    . Vitamin D, Ergocalciferol, (DRISDOL) 50000 UNITS CAPS capsule Take 50,000 Units by mouth every 7 (seven) days.    [name] warfarin (COUMADIN) 2 MG tablet Take 1 tablet (2 mg total) by mouth See admin instructions. 30 tablet 3   . warfarin (COUMADIN) 3 MG tablet Take as directed by coumadin clinic 20 tablet 1   No current facility-administered medications for this visit.    Allergies  Allergen Reactions  . Amoxicillin   . Cefdinir Diarrhea  . Cilostazol     Other reaction(s): Headache  . Levofloxacin   . Plavix [Clopidogrel Bisulfate]     12/12/11 patient reports she has never taken Plavix before.     Review of Systems negative except from HPI and PMH  Physical Exam Ht 5\' 2"  (1.575 m)  Wt 129 lb (58.514 kg)  BMI 23.59 kg/m2 Well developed and nourished in no acute distress HENT normal Neck supple with JVP-flat Clear Regular rate and rhythm, no murmurs or gallops Abd-soft with active BS No Clubbing cyanosis edema Skin-warm and dry A & Oriented  Grossly normal sensory and motor function  ECG demonstrates ventricular pacing with complete heart block and underlying sinus rhythm  Device interrogation demonstrates that almost all of her heart beats are in the less than 70 bpm bin  Assessment and  Plan  Complete heart block S./P. AV nodal ablation  Atrial fibrillation permanent  Fatigue  We have reprogrammed her device by changing her threshold on her HDL from median/high-medial/low. This was associated with an improvement in achieving her heart rate is only 4% of her beats had exceeded 70 bpm and almost 0 had exceeded 80 bpm to walking tests in the office prompted [place] to decrease the ADL rate from 95--90. It may be that if she continues to have sensations of tachypalpitations we will further decrease it from 90--85  The other concern regarding her fatigue his medications. We will stop her amlodipine as her blood pressures run low anyway. However, we will wait a couple of weeks to do that so as to be able to distinguish the effects of pacing reprogramming the discontinuation of her amlodipine. If her fatigue persists I asked her to follow up with her PCP to consider down titration of her Neurontin

## 2016-03-08 ENCOUNTER — Encounter: Payer: Self-pay | Admitting: Internal Medicine

## 2016-03-08 ENCOUNTER — Ambulatory Visit (INDEPENDENT_AMBULATORY_CARE_PROVIDER_SITE_OTHER): Payer: Medicare Other | Admitting: Internal Medicine

## 2016-03-08 VITALS — Ht 62.0 in | Wt 129.0 lb

## 2016-03-08 DIAGNOSIS — I4891 Unspecified atrial fibrillation: Secondary | ICD-10-CM | POA: Diagnosis not present

## 2016-03-08 DIAGNOSIS — I493 Ventricular premature depolarization: Secondary | ICD-10-CM

## 2016-03-08 LAB — CUP PACEART INCLINIC DEVICE CHECK
Date Time Interrogation Session: 20170510133145
Implantable Lead Implant Date: 20000718
Lead Channel Setting Pacing Pulse Width: 0.4 ms
Lead Channel Setting Sensing Sensitivity: 5.6 mV
MDC IDC LEAD IMPLANT DT: 20000718
MDC IDC LEAD LOCATION: 753859
MDC IDC LEAD LOCATION: 753860
MDC IDC SET LEADCHNL RV PACING AMPLITUDE: 2.5 V

## 2016-03-08 NOTE — Patient Instructions (Addendum)
Medication Instructions: - In 2 weeks (around 03/22/16) please stop amlodipine  Labwork: - none  Procedures/Testing: - none  Follow-Up: - Remote monitoring is used to monitor your Pacemaker of ICD from home. This monitoring reduces the number of office visits required to check your device to one time per year. It allows [place] to keep an eye on the functioning of your device to ensure it is working properly. You are scheduled for a device check from home on 06/07/16. You may send your transmission at any time that day. If you have a wireless device, the transmission will be sent automatically. After your physician reviews your transmission, you will receive a postcard with your next transmission date.  - Your physician wants you to follow-up in: 6 months (November 2017) with Dr. Caryl Comes. You will receive a reminder letter in the mail two months in advance. If you don't receive a letter, please call our office to schedule the follow-up appointment.  Any Additional Special Instructions Will Be Listed Below (If Applicable).     If you need a refill on your cardiac medications before your next appointment, please call your pharmacy.

## 2016-03-30 ENCOUNTER — Ambulatory Visit (INDEPENDENT_AMBULATORY_CARE_PROVIDER_SITE_OTHER): Payer: Medicare Other

## 2016-03-30 DIAGNOSIS — Z5181 Encounter for therapeutic drug level monitoring: Secondary | ICD-10-CM | POA: Diagnosis not present

## 2016-03-30 DIAGNOSIS — I4891 Unspecified atrial fibrillation: Secondary | ICD-10-CM | POA: Diagnosis not present

## 2016-03-30 DIAGNOSIS — Z7901 Long term (current) use of anticoagulants: Secondary | ICD-10-CM | POA: Diagnosis not present

## 2016-03-30 LAB — POCT INR: INR: 2.9

## 2016-03-31 ENCOUNTER — Encounter: Payer: Self-pay | Admitting: Urology

## 2016-03-31 ENCOUNTER — Ambulatory Visit (INDEPENDENT_AMBULATORY_CARE_PROVIDER_SITE_OTHER): Payer: Medicare Other | Admitting: Urology

## 2016-03-31 VITALS — BP 146/78 | HR 74 | Ht 62.0 in | Wt 128.9 lb

## 2016-03-31 DIAGNOSIS — N362 Urethral caruncle: Secondary | ICD-10-CM | POA: Diagnosis not present

## 2016-03-31 DIAGNOSIS — N3941 Urge incontinence: Secondary | ICD-10-CM | POA: Diagnosis not present

## 2016-03-31 DIAGNOSIS — N393 Stress incontinence (female) (male): Secondary | ICD-10-CM | POA: Diagnosis not present

## 2016-03-31 DIAGNOSIS — R32 Unspecified urinary incontinence: Secondary | ICD-10-CM

## 2016-03-31 DIAGNOSIS — R339 Retention of urine, unspecified: Secondary | ICD-10-CM | POA: Diagnosis not present

## 2016-03-31 DIAGNOSIS — N816 Rectocele: Secondary | ICD-10-CM

## 2016-03-31 LAB — URINALYSIS, COMPLETE
Bilirubin, UA: NEGATIVE
GLUCOSE, UA: NEGATIVE
Ketones, UA: NEGATIVE
LEUKOCYTES UA: NEGATIVE
Nitrite, UA: NEGATIVE
PROTEIN UA: NEGATIVE
RBC, UA: NEGATIVE
Specific Gravity, UA: 1.02 (ref 1.005–1.030)
UUROB: 0.2 mg/dL (ref 0.2–1.0)
pH, UA: 5.5 (ref 5.0–7.5)

## 2016-03-31 LAB — MICROSCOPIC EXAMINATION: Bacteria, UA: NONE SEEN

## 2016-03-31 LAB — BLADDER SCAN AMB NON-IMAGING: SCAN RESULT: 0

## 2016-03-31 NOTE — Progress Notes (Signed)
03/31/2016 11:02 AM   [name] [birth year] [id number]  Referring provider: Adin Hector, MD [place], [place] [address]  Chief Complaint  Patient presents with  . Urinary Incontinence    new pt    HPI: 67 y F presents today accompanied by her daughter for further evaluation of worsening urinary incontinence.  She reports over the past several months, she had increase in her pads from small pain liners to poise pads which she has to change it at least 3 times a day due to complete saturation. She often will have the urge to to urinate and not be able to get to the bathroom on time and have large episodes of incontinence. On other occasions, she is unaware that she has voided and just feels wet.  She does have rare episodes of this in the past but it has recently progressed.  Nocturia x 2.    In addition to her incontinence, she reports that she often will urinate and then urinate an additional large amount before getting up from the bathroom. She often will lean forward or move her body in order to get the remaining urine output. Ultimately, she does feel that she is able to empty her bladder.  He also has baseline mild stress urinary incontinence with a small amount of leakage with laughing, coughing, sneezing, and activity. She thinks that this is likely unchanged from her baseline.  She denies any history of urinary tract infections, dysuria, or any other issues. Denies a history of flank pain, fevers, or chills.  No history of gross hematuria.  She is on chronic Coumadin for A. fib.  She is status post abdominal hysterectomy for fibroids.  She is been told in the past that her bladder has dropped but denies any bulging or pressure in the vaginal area. She is not sexually active.  She does have irritable bowel syndrome and history of C. difficile and alternates from constipation to loose stools. She does occasionally strain to evacuate  her stool.    PMH: Past Medical History  Diagnosis Date  . Non Hodgkin's lymphoma ([place])     stable; followed by an oncologist  . Hyperlipidemia   . Hypertension   . Hereditary hemochromatosis     Phlebotomized in the past, not getting phlebotomy now  . GERD (gastroesophageal reflux disease)   . Complete heart block  s/p av ablation     with atrial fibrillation. Pt is s/p AV nodal ablation due to permanent and symptomatic atrial fibrillation.   . Coronary artery disease     Nonobstructive  . C. difficile colitis     feces transplant  . Atrial fibrillation ([place]) 07/20/2009    Qualifier: Diagnosis of  By: Caryl Comes, MD, Leonidas Romberg Mack Guise   . Pacemaker-dual-chamber-Medtronic 04/04/2011  . 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 Diverticulitis   . Osteoarthritis     Surgical History: Past Surgical History  Procedure Laterality Date  . Cardiac catheterization    . Abdominal hysterectomy    . Cholecystectomy    . Atrial ablation surgery  2008    AV nodal ablation  . US echocardiography  07/2008    EF 55%, mild LVH. No significant valvular abnormalities  . Colonoscopy  oct.12, 2012  . Stool transplant    . Cardioversion    . Insert / replace / remove pacemaker    . Pacemaker generator change N/A 12/17/2012    Procedure: PACEMAKER GENERATOR CHANGE;  Surgeon: Remo Lipps  Peterson Lombard, MD;  Location: [facility] C[facility] LAB;  Service: Cardiovascular;  Laterality: N/A;  . Breast biopsy  benign    Home Medications:    Medication List       This list is accurate as of: 03/31/16 11:02 AM.  Always use your most recent med list.               alendronate 70 MG tablet  Commonly known as:  FOSAMAX  Take 70 mg by mouth every 7 (seven) days. Takes on Tuesdays. Take with a full glass of water on an empty stomach.     ALPRAZolam 0.25 MG tablet  Commonly known as:  XANAX  Take 0.5 mg by mouth at bedtime as needed for sleep.     fluticasone 50 MCG/ACT nasal spray  Commonly known as:  FLONASE  Place 2 sprays  into the nose daily as needed for allergies.     folic acid 1 MG tablet  Commonly known as:  FOLVITE  Take 1 mg by mouth daily.     gabapentin 100 MG capsule  Commonly known as:  NEURONTIN  Take 100 mg by mouth. Takes 1 tablet in the am daily.     gabapentin 400 MG capsule  Commonly known as:  NEURONTIN  Take 400 mg by mouth at bedtime.     polyvinyl alcohol 1.4 % ophthalmic solution  Commonly known as:  LIQUIFILM TEARS  Place 2 drops into both eyes 3 (three) times daily as needed (for dry eyes).     PROTONIX PO  Take 40 mg by mouth daily.     Vitamin D (Ergocalciferol) 50000 units Caps capsule  Commonly known as:  DRISDOL  Take 50,000 Units by mouth every 7 (seven) days.     warfarin 2 MG tablet  Commonly known as:  COUMADIN  Take 1 tablet (2 mg total) by mouth See admin instructions.     warfarin 3 MG tablet  Commonly known as:  COUMADIN  Take as directed by coumadin clinic     ZYRTEC ALLERGY PO  Take by mouth daily.        Allergies:  Allergies  Allergen Reactions  . Amoxicillin   . Cefdinir Diarrhea  . Cilostazol     Other reaction(s): Headache  . Levofloxacin   . Plavix [Clopidogrel Bisulfate]     12/12/11 patient reports she has never taken Plavix before.     Family History: Family History  Problem Relation Age of Onset  . Liver disease Father   . Cancer Mother   . Prostate cancer Neg Hx   . Kidney disease Neg Hx   . Kidney cancer Neg Hx     Social History:  reports that she has quit smoking. She has never used smokeless tobacco. She reports that she does not drink alcohol or use illicit drugs.  ROS: UROLOGY Frequent Urination?: Yes Hard to postpone urination?: Yes Burning/pain with urination?: No Get up at night to urinate?: Yes Leakage of urine?: Yes Urine stream starts and stops?: Yes Trouble starting stream?: Yes Do you have to strain to urinate?: No Blood in urine?: No Urinary tract infection?: No Sexually transmitted disease?:  No Injury to kidneys or bladder?: No Painful intercourse?: No Weak stream?: No Currently pregnant?: No Vaginal bleeding?: No Last menstrual period?: n  Gastrointestinal Nausea?: No Vomiting?: No Indigestion/heartburn?: Yes Diarrhea?: No Constipation?: No  Constitutional Fever: No Night sweats?: No Weight loss?: No Fatigue?: Yes  Skin Skin rash/lesions?: No Itching?: No  Eyes Blurred  vision?: No Double vision?: No  Ears/Nose/Throat Sore throat?: No Sinus problems?: Yes  Hematologic/Lymphatic Swollen glands?: No Easy bruising?: No  Cardiovascular Leg swelling?: No Chest pain?: No  Respiratory Cough?: No Shortness of breath?: No  Endocrine Excessive thirst?: No  Musculoskeletal Back pain?: Yes Joint pain?: No  Neurological Headaches?: No Dizziness?: Yes  Psychologic Depression?: No Anxiety?: Yes  Physical Exam: BP 146/78 mmHg  Pulse 74  Ht 5\' 2"  (1.575 m)  Wt 128 lb 14.4 oz (58.469 kg)  BMI 23.57 kg/m2  Constitutional:  Alert and oriented, No acute distress.  Well dressed. Accompanied by daughter today. HEENT: [place] AT, moist mucus membranes.  Trachea midline, no masses. Cardiovascular: No clubbing, cyanosis, or edema. Respiratory: Normal respiratory effort, no increased work of breathing. GI: Abdomen is soft, nontender, nondistended, no abdominal masses GU: No CVA tenderness. Normal external genitalia. Urethral meatus with small caruncle at the 6:00 position. Atrophic vaginitis notable. No significant apical descent or cystocele with Valsalva today. Grade 1/2 rectocele with Valsalva and vaginal varices noted posteriorly. Skin: No rashes, bruises or suspicious lesions. Lymph: No cervical or inguinal adenopathy. Neurologic: Grossly intact, no focal deficits, moving all 4 extremities. Psychiatric: Normal mood and affect.  An accurate assessment of postvoid residual, the urethral meatus was prepped using Betadine solution. A 16 French red rubber  catheter was inserted just within the urethra. 50 cc of dark yellow urine was obtained for a post void residual.  Laboratory Data: Lab Results  Component Value Date   WBC 9.5 06/08/2014   HGB 15.7 06/08/2014   HCT 43.6 06/08/2014   MCV 99 06/08/2014   PLT 194 06/08/2014    Lab Results  Component Value Date   CREATININE 0.79 06/08/2014    Urinalysis UA today completely negative, no evidence of microhematuria or urinary tract infection.  Pertinent Imaging: Results for orders placed or performed in visit on 03/31/16  BLADDER SCAN AMB NON-IMAGING  Result Value Ref Range   Scan Result 0     Assessment & Plan:    1. Urge incontinence Worsening bladder overactivity with episodes of urge incontinence. We discussed behavioral modifications at length today. In addition, I would like to try her on Mybetriq 25 mg 2 weeks samples to see if this helps with her symptoms. If helpful, we'll prescribe this medication. If not, we'll increase dose to 50 mg. We discussed the common side effects of this medication. Given her age, I'm hesitant to start her on any anticholinergic medications.  Recommend follow-up in 3 months to reassess symptoms, repeat postvoid residual.  - BLADDER SCAN AMB NON-IMAGING - Urinalysis, Complete  2. Stress incontinence, female Minimal baseline stress urinary incontinence. Discussed  Kegel exercises today.  3. Incomplete bladder emptying Catheterized post void residual today 50 cc. We discussed double voiding with Cred maneuver in order to help facilitate complete bladder emptying.  4. Urethral caruncle Incidental asymptomatic urethral caruncle noted. If becomes symptomatic with dysuria or bleeding, may consider treatment with estorgen cream.  5. Rectocele Stage I/2 rectocele. Patient also has a history of irritable bowel syndrome with alternating constipation and loose stool. It unclear whether or not she has difficulty evacuating her stool as a result of the  rectocele. I have recommended a daily stool softener in the form of Colace to help her evacuate.  Return in about 3 months (around 07/01/2016) for PVR, symptoms recheck.  Hollice Espy, MD  [facility] [address] [place], [place] [address] [phone]

## 2016-04-12 ENCOUNTER — Telehealth: Payer: Self-pay | Admitting: Radiology

## 2016-04-12 NOTE — Telephone Encounter (Signed)
Pt would like to come to office to get samples of Myrbetriq.

## 2016-04-12 NOTE — Telephone Encounter (Signed)
Samples left up front.

## 2016-04-12 NOTE — Telephone Encounter (Signed)
I would have her come by and pickup 50 mg samples 2 weeks. Unfortunately, given her age, there aren't many other safe pharmalogical options as I would like to avoid anticholinergic.    If this fails, then she can return to the office to discuss PTNS.    Hollice Espy, MD

## 2016-04-12 NOTE — Telephone Encounter (Signed)
Pt states she was given Myrbetriq samples to try at 25mg  daily. This has not helped her symptoms at all. She doesn't want to try a higher dose d/t the cost of the medication.  She asks for another alternative. Please advise.

## 2016-04-13 ENCOUNTER — Ambulatory Visit (INDEPENDENT_AMBULATORY_CARE_PROVIDER_SITE_OTHER): Payer: Medicare Other | Admitting: Urology

## 2016-04-13 ENCOUNTER — Encounter: Payer: Self-pay | Admitting: Urology

## 2016-04-13 VITALS — BP 152/77 | HR 68 | Ht 62.0 in | Wt 131.0 lb

## 2016-04-13 DIAGNOSIS — N3941 Urge incontinence: Secondary | ICD-10-CM | POA: Diagnosis not present

## 2016-04-13 DIAGNOSIS — K449 Diaphragmatic hernia without obstruction or gangrene: Secondary | ICD-10-CM

## 2016-04-13 DIAGNOSIS — N816 Rectocele: Secondary | ICD-10-CM | POA: Diagnosis not present

## 2016-04-13 DIAGNOSIS — N393 Stress incontinence (female) (male): Secondary | ICD-10-CM

## 2016-04-13 DIAGNOSIS — K219 Gastro-esophageal reflux disease without esophagitis: Secondary | ICD-10-CM | POA: Insufficient documentation

## 2016-04-13 DIAGNOSIS — N362 Urethral caruncle: Secondary | ICD-10-CM

## 2016-04-13 DIAGNOSIS — E782 Mixed hyperlipidemia: Secondary | ICD-10-CM | POA: Insufficient documentation

## 2016-04-13 DIAGNOSIS — M1990 Unspecified osteoarthritis, unspecified site: Secondary | ICD-10-CM | POA: Insufficient documentation

## 2016-04-13 DIAGNOSIS — G629 Polyneuropathy, unspecified: Secondary | ICD-10-CM | POA: Insufficient documentation

## 2016-04-13 NOTE — Patient Instructions (Addendum)

## 2016-04-14 NOTE — Progress Notes (Signed)
4:21 PM  04/13/16  [name] 1927/11/09 [id number]  Referring provider: Adin Hector, MD [place], [place] [address]  Chief Complaint  Patient presents with  . Bladder Prolapse    HPI: 24 y F with OAB, pelvic organ prolapse who returns to the office today after experiencing an episode yesterday where she had a bulging vaginal mass concerning for prolapse of the vagina. She ultimately was able to reduce this but is very disturbing alarming to her. She maintained an appointment today for follow-up as she is quite concerned. This has never happened previously.  Since her visit 2 weeks ago, she was given 2 weeks of Mybetriq 25 mg to try. She states that this has not helped much with her urinary symptoms/ incontinence.   She told in the past that her bladder has dropped but denies any bulging or pressure in the vaginal area. She is not sexually active. On exam, her bladder had adequate support but she did have a fairly significant rectocele.  She does have irritable bowel syndrome and history of C. difficile and alternates from constipation to loose stools. She does occasionally strain to evacuate her stool.    PMH: Past Medical History  Diagnosis Date  . Non Hodgkin's lymphoma ([place])     stable; followed by an oncologist  . Hyperlipidemia   . Hypertension   . Hereditary hemochromatosis     Phlebotomized in the past, not getting phlebotomy now  . GERD (gastroesophageal reflux disease)   . Complete heart block  s/p av ablation     with atrial fibrillation. Pt is s/p AV nodal ablation due to permanent and symptomatic atrial fibrillation.   . Coronary artery disease     Nonobstructive  . C. difficile colitis     feces transplant  . Atrial fibrillation ([place]) 07/20/2009    Qualifier: Diagnosis of  By: Caryl Comes, MD, Leonidas Romberg Mack Guise   . Pacemaker-dual-chamber-Medtronic 04/04/2011  . 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 Diverticulitis   .  Osteoarthritis     Surgical History: Past Surgical History  Procedure Laterality Date  . Cardiac catheterization    . Abdominal hysterectomy    . Cholecystectomy    . Atrial ablation surgery  2008    AV nodal ablation  . US echocardiography  07/2008    EF 55%, mild LVH. No significant valvular abnormalities  . Colonoscopy  oct.12, 2012  . Stool transplant    . Cardioversion    . Insert / replace / remove pacemaker    . Pacemaker generator change N/A 12/17/2012    Procedure: PACEMAKER GENERATOR CHANGE;  Surgeon: Deboraha Sprang, MD;  Location[facility] CATH LAB;  Service: Cardiovascular;  Laterality: N/A;  . Breast biopsy  benign    Home Medications:    Medication List       This list is accurate as of: 04/13/16 11:59 PM.  Always use your most recent med list.               alendronate 70 MG tablet  Commonly known as:  FOSAMAX  Take 70 mg by mouth every 7 (seven) days. Takes on Tuesdays. Take with a full glass of water on an empty stomach.     ALPRAZolam 0.25 MG tablet  Commonly known as:  XANAX  Take 0.5 mg by mouth at bedtime as needed for sleep.     fluticasone 50 MCG/ACT nasal spray  Commonly known as:  [place]  2 sprays into the nose daily as needed for allergies.     folic acid 1 MG tablet  Commonly known as:  FOLVITE  Take 1 mg by mouth daily.     gabapentin 100 MG capsule  Commonly known as:  NEURONTIN  Take 100 mg by mouth. Takes 1 tablet in the am daily.     gabapentin 400 MG capsule  Commonly known as:  NEURONTIN  Take 400 mg by mouth at bedtime.     polyvinyl alcohol 1.4 % ophthalmic solution  Commonly known as:  LIQUIFILM TEARS  Place 2 drops into both eyes 3 (three) times daily as needed (for dry eyes).     PROTONIX PO  Take 40 mg by mouth daily.     Vitamin D (Ergocalciferol) 50000 units Caps capsule  Commonly known as:  DRISDOL  Take 50,000 Units by mouth every 7 (seven) days.     warfarin 2 MG tablet  Commonly known as:  COUMADIN    Take 1 tablet (2 mg total) by mouth See admin instructions.     warfarin 3 MG tablet  Commonly known as:  COUMADIN  Take as directed by coumadin clinic     ZYRTEC ALLERGY PO  Take by mouth daily.        Allergies:  Allergies  Allergen Reactions  . Amoxicillin   . Cefdinir Diarrhea  . Cilostazol     Other reaction(s): Headache  . Levofloxacin   . Plavix [Clopidogrel Bisulfate]     12/12/11 patient reports she has never taken Plavix before.     Family History: Family History  Problem Relation Age of Onset  . Liver disease Father   . Cancer Mother   . Prostate cancer Neg Hx   . Kidney disease Neg Hx   . Kidney cancer Neg Hx     Social History:  reports that she has quit smoking. She has never used smokeless tobacco. She reports that she does not drink alcohol or use illicit drugs.  ROS: UROLOGY Frequent Urination?: Yes Hard to postpone urination?: Yes Burning/pain with urination?: Yes Get up at night to urinate?: Yes Leakage of urine?: Yes Urine stream starts and stops?: Yes Trouble starting stream?: No Do you have to strain to urinate?: No Blood in urine?: No Urinary tract infection?: No Sexually transmitted disease?: No Injury to kidneys or bladder?: No Painful intercourse?: No Weak stream?: No Currently pregnant?: No Vaginal bleeding?: No Last menstrual period?: n  Gastrointestinal Nausea?: No Vomiting?: No Indigestion/heartburn?: No Diarrhea?: No Constipation?: No  Constitutional Fever: No Night sweats?: No Weight loss?: No Fatigue?: No  Skin Skin rash/lesions?: No Itching?: No  Eyes Blurred vision?: No Double vision?: No  Ears/Nose/Throat Sore throat?: No Sinus problems?: No  Hematologic/Lymphatic Swollen glands?: No Easy bruising?: No  Cardiovascular Leg swelling?: No Chest pain?: No  Respiratory Cough?: No Shortness of breath?: No  Endocrine Excessive thirst?: No  Musculoskeletal Back pain?: No Joint pain?:  No  Neurological Headaches?: No Dizziness?: Yes  Psychologic Depression?: No Anxiety?: No  Physical Exam: BP 152/77 mmHg  Pulse 68  Ht 5\' 2"  (1.575 m)  Wt 131 lb (59.421 kg)  BMI 23.95 kg/m2  Constitutional:  Alert and oriented, No acute distress.  Well dressed. Accompanied by daughter today. HEENT: [place] AT, moist mucus membranes.  Trachea midline, no masses. Cardiovascular: No clubbing, cyanosis, or edema. Respiratory: Normal respiratory effort, no increased work of breathing. Skin: No rashes, bruises or suspicious lesions. Psychiatric: Normal mood and affect.  Laboratory Data: Lab Results  Component Value Date   WBC 9.5 06/08/2014   HGB 15.7 06/08/2014   HCT 43.6 06/08/2014   MCV 99 06/08/2014   PLT 194 06/08/2014    Lab Results  Component Value Date   CREATININE 0.79 06/08/2014     Assessment & Plan:    1. Urge incontinence Plan to increase dose to 50 mg Mybetriq, 2 weeks samples givne today If no effect, discussed options for refractory OAB/urge incontinence --> PTNS, botox, interstim Given her age, I'm hesitant to recommend an anticholinergic  2. Stress incontinence, female Minimal baseline stress urinary incontinence. Discussed  Kegel exercises again today. Handout given with instructions.  3. Urethral caruncle Incidental asymptomatic urethral caruncle noted. If becomes symptomatic with dysuria or bleeding, may consider treatment with estorgen cream.  4. Rectocele Stage I/2 rectocele. Symptomatic episode yesterday with prolapse beyond the introitus. She also has a history of bowel issues and have previously encouraged Colace. We discussed options moving forward including surgical reconstruction and pessary. Given her age, I feel it a pessary may be her best option. We discussed the risks and benefits of this. She will consider this and work on getting her bowels more regular. If she has continued episodes, she would like a referral for pessary  fitting.  Follow-up as previously scheduled.  Hollice Espy, MD  [facility] [address] [place], [place] [address] [phone]

## 2016-04-19 ENCOUNTER — Telehealth: Payer: Self-pay | Admitting: Urology

## 2016-04-19 NOTE — Telephone Encounter (Signed)
Ms. Folker called saying often times she feels like she needs to have a bowel movement when she doesn't need to.  She also feels a "little ball" that was in her vagina but now the feeling is in her rectum. She's wondering if her bowels are coming through her vagina and she wants to know if Dr. Vira Agar or Dawson Bills or Dr. Sharlett Iles need to see her. She mentioned the pills that were prescribed to help for constant urination aren't working. Has Dr. Kerrin Mo received her results from [facility]? Please advise and call the pt.    Pt's ph# [phone] Thank you.

## 2016-04-20 NOTE — Telephone Encounter (Signed)
We have addressed this now several times.  She has a rectocele which is consistent with her symptoms.    Unfortunately, there aren't a lot of options for her OAB other than possibly PTNS.  I would like to see her back to discuss this.    I have offered her a referral for a pessary.  Would she like to see Dr. Marcelline Mates?  Hollice Espy, MD

## 2016-04-22 NOTE — Telephone Encounter (Signed)
Spoke with pt in reference to previously discussed rectocele. Reinforced with pt the offer of seeing Dr. Marcelline Mates for a pessary. Pt stated that she would prefer to come back in and speak with Dr. Erlene Quan before seeing Dr. Marcelline Mates. Pt was very unsure of the pessary. Pt then inquired about myrbetriq samples. Pt stated that medication has helped some but she was not having the results she had hoped for. Per Dr. Erlene Quan enough samples were given to get pt through to her appt. Pt voiced understanding of whole conversation.

## 2016-05-04 ENCOUNTER — Other Ambulatory Visit: Payer: Self-pay | Admitting: Pharmacist

## 2016-05-04 MED ORDER — WARFARIN SODIUM 3 MG PO TABS: ORAL_TABLET | ORAL | Status: DC

## 2016-05-04 MED ORDER — WARFARIN SODIUM 2 MG PO TABS: 2.0000 mg | ORAL_TABLET | Freq: Every day | ORAL | Status: DC

## 2016-05-06 ENCOUNTER — Ambulatory Visit: Payer: Self-pay | Admitting: Urology

## 2016-05-11 ENCOUNTER — Ambulatory Visit (INDEPENDENT_AMBULATORY_CARE_PROVIDER_SITE_OTHER): Payer: Medicare Other | Admitting: *Deleted

## 2016-05-11 DIAGNOSIS — Z7901 Long term (current) use of anticoagulants: Secondary | ICD-10-CM | POA: Diagnosis not present

## 2016-05-11 DIAGNOSIS — I4891 Unspecified atrial fibrillation: Secondary | ICD-10-CM

## 2016-05-11 DIAGNOSIS — Z5181 Encounter for therapeutic drug level monitoring: Secondary | ICD-10-CM

## 2016-05-11 LAB — POCT INR: INR: 3.1

## 2016-05-30 ENCOUNTER — Telehealth: Payer: Self-pay | Admitting: Urology

## 2016-05-30 DIAGNOSIS — N819 Female genital prolapse, unspecified: Secondary | ICD-10-CM

## 2016-05-30 NOTE — Telephone Encounter (Signed)
Patient had to cx her appt with you on 17-Jun-2016 due to a death in the family. She wanted to know if she could get a referral to gyn for a pessary?   Thanks  michelle

## 2016-05-30 NOTE — Telephone Encounter (Signed)
Absolutely.  Order placed.  I usually send patient's to Dr. Marcelline Mates.    Hollice Espy, MD

## 2016-06-03 ENCOUNTER — Ambulatory Visit: Payer: Medicare Other | Admitting: Urology

## 2016-06-07 ENCOUNTER — Telehealth: Payer: Self-pay | Admitting: Cardiology

## 2016-06-07 ENCOUNTER — Ambulatory Visit (INDEPENDENT_AMBULATORY_CARE_PROVIDER_SITE_OTHER): Payer: Medicare Other | Admitting: *Deleted

## 2016-06-07 DIAGNOSIS — I442 Atrioventricular block, complete: Secondary | ICD-10-CM | POA: Diagnosis not present

## 2016-06-07 NOTE — Progress Notes (Signed)
Remote pacemaker transmission.   

## 2016-06-07 NOTE — Telephone Encounter (Signed)
Spoke with pt and reminded pt of remote transmission that is due today. Pt verbalized understanding.   

## 2016-06-08 ENCOUNTER — Encounter: Payer: Self-pay | Admitting: Cardiology

## 2016-06-13 LAB — CUP PACEART REMOTE DEVICE CHECK
Battery Impedance: 304 Ohm
Battery Voltage: 2.79 V
Brady Statistic RV Percent Paced: 100 %
Implantable Lead Implant Date: 20000718
Implantable Lead Location: 753859
Implantable Lead Location: 753860
Lead Channel Impedance Value: 488 Ohm
Lead Channel Setting Pacing Amplitude: 2.5 V
Lead Channel Setting Pacing Pulse Width: 0.4 ms
MDC IDC LEAD IMPLANT DT: 20000718
MDC IDC MSMT BATTERY REMAINING LONGEVITY: 110 mo
MDC IDC MSMT LEADCHNL RA IMPEDANCE VALUE: 67 Ohm
MDC IDC SESS DTM: 20170808140321
MDC IDC SET LEADCHNL RV SENSING SENSITIVITY: 5.6 mV

## 2016-06-15 ENCOUNTER — Ambulatory Visit (INDEPENDENT_AMBULATORY_CARE_PROVIDER_SITE_OTHER): Payer: Medicare Other

## 2016-06-15 DIAGNOSIS — Z7901 Long term (current) use of anticoagulants: Secondary | ICD-10-CM | POA: Diagnosis not present

## 2016-06-15 DIAGNOSIS — I4891 Unspecified atrial fibrillation: Secondary | ICD-10-CM | POA: Diagnosis not present

## 2016-06-15 DIAGNOSIS — Z5181 Encounter for therapeutic drug level monitoring: Secondary | ICD-10-CM | POA: Diagnosis not present

## 2016-06-15 LAB — POCT INR: INR: 3.5

## 2016-07-01 ENCOUNTER — Ambulatory Visit: Payer: Medicare Other | Admitting: Urology

## 2016-07-06 ENCOUNTER — Ambulatory Visit (INDEPENDENT_AMBULATORY_CARE_PROVIDER_SITE_OTHER): Payer: Medicare Other

## 2016-07-06 DIAGNOSIS — Z7901 Long term (current) use of anticoagulants: Secondary | ICD-10-CM | POA: Diagnosis not present

## 2016-07-06 DIAGNOSIS — I4891 Unspecified atrial fibrillation: Secondary | ICD-10-CM | POA: Diagnosis not present

## 2016-07-06 DIAGNOSIS — Z5181 Encounter for therapeutic drug level monitoring: Secondary | ICD-10-CM | POA: Diagnosis not present

## 2016-07-06 LAB — POCT INR: INR: 2.7

## 2016-07-18 NOTE — Progress Notes (Signed)
[name]  [phone]) [phone] [phone]) [phone]  ID: [name] OB: 06-16-1928  MR#: [id number]  PYP#:[id number]  Patient Care Team: Adin Hector, MD as PCP - General (Internal Medicine)  CHIEF COMPLAINT: Hereditary hemochromatosis, CLL.  INTERVAL HISTORY: Patient is an 82 year old female whose last evaluated in clinic in 3-4 years ago. She is referred back for consideration of reinitiating phlebotomy after noting her hemoglobin is trending up. Currently, she feels well and is asymptomatic. She has no neurologic complaints. She denies any weakness or fatigue. She has a good appetite and denies weight loss. She denies any recent fevers or illnesses. She has no chest pain or shortness of breath. She denies any nausea, vomiting, constipation, or diarrhea. She has no urinary complaints. Patient feels at her baseline and offers no specific complaints today.  REVIEW OF SYSTEMS:   Review of Systems  Constitutional: Negative.  Negative for fever, malaise/fatigue and weight loss.  Respiratory: Negative.  Negative for cough.   Cardiovascular: Negative.  Negative for chest pain and leg swelling.  Gastrointestinal: Negative.  Negative for abdominal pain, blood in stool and melena.  Genitourinary: Negative.   Neurological: Negative.  Negative for sensory change and weakness.  Psychiatric/Behavioral: Negative.  The patient is not nervous/anxious.     As per HPI. Otherwise, a complete review of systems is negative.  PAST MEDICAL HISTORY: Past Medical History:  Diagnosis Date  . Atrial fibrillation ([place]) 07/20/2009   Qualifier: Diagnosis of  By: Caryl Comes, MD, Leonidas Romberg Mack Guise   . C. difficile colitis    feces transplant  . Complete heart block  s/p av ablation    with atrial fibrillation. Pt is s/p AV nodal ablation due to permanent and symptomatic atrial fibrillation.   . Coronary artery disease    Nonobstructive  . Diverticulitis   . GERD (gastroesophageal  reflux disease)   . Hereditary hemochromatosis    Phlebotomized in the past, not getting phlebotomy now  . Hyperlipidemia   . Hypertension   . Non Hodgkin's lymphoma ([place])    stable; followed by an oncologist  . Osteoarthritis   . 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 Pacemaker-dual-chamber-Medtronic 04/04/2011    PAST SURGICAL HISTORY: Past Surgical History:  Procedure Laterality Date  . ABDOMINAL HYSTERECTOMY    . ATRIAL ABLATION SURGERY  2008   AV nodal ablation  . BREAST BIOPSY  benign  . CARDIAC CATHETERIZATION    . CARDIOVERSION    . CHOLECYSTECTOMY    . COLONOSCOPY  oct.12, 2012  . INSERT / REPLACE / REMOVE PACEMAKER    . PACEMAKER GENERATOR CHANGE N/A 12/17/2012   Procedure: PACEMAKER GENERATOR CHANGE;  Surgeon: Deboraha Sprang, MD;  Location[facility] C[facility] LAB;  Service: Cardiovascular;  Laterality: N/A;  . stool transplant    . US ECHOCARDIOGRAPHY  07/2008   EF 55%, mild LVH. No significant valvular abnormalities    FAMILY HISTORY: Family History  Problem Relation Age of Onset  . Liver disease Father   . Cancer Mother   . Prostate cancer Neg Hx   . Kidney disease Neg Hx   . Kidney cancer Neg Hx   . Breast cancer Neg Hx   . Ovarian cancer Neg Hx   . Colon cancer Neg Hx     ADVANCED DIRECTIVES (Y/N):  N  HEALTH MAINTENANCE: Social History  Substance Use Topics  . Smoking status: Former Smoker    Quit date: 1996  . Smokeless tobacco: Never Used  . Alcohol use No  Colonoscopy:  PAP:  Bone density:  Lipid panel:  Allergies  Allergen Reactions  . Amoxicillin   . Cefdinir Diarrhea  . Cilostazol     Other reaction(s): Headache  . Levofloxacin   . Plavix [Clopidogrel Bisulfate]     12/12/11 patient reports she has never taken Plavix before.   . Sulfur Nausea Only    Current Outpatient Prescriptions  Medication Sig Dispense Refill  . ALPRAZolam (XANAX) 0.25 MG tablet Take 0.5 mg by mouth at bedtime as needed for sleep.    [name] amLODipine (NORVASC) 5 MG tablet  TAKE ONE TABLET BY MOUTH ONCE DAILY    . Cetirizine HCl (ZYRTEC ALLERGY PO) Take by mouth daily.     . fluticasone (FLONASE) 50 MCG/ACT nasal spray Place 2 sprays into the nose daily as needed for allergies.     . folic acid (FOLVITE) 1 MG tablet Take 1 mg by mouth daily.      [name] gabapentin (NEURONTIN) 100 MG capsule Take 100 mg by mouth. Takes 1 tablet in the am daily.    [name] gabapentin (NEURONTIN) 400 MG capsule Take 400 mg by mouth at bedtime.     . Pantoprazole Sodium (PROTONIX PO) Take 40 mg by mouth daily.    . polyvinyl alcohol (LIQUIFILM TEARS) 1.4 % ophthalmic solution Place 2 drops into both eyes 3 (three) times daily as needed (for dry eyes).    . warfarin (COUMADIN) 2 MG tablet Take 1 tablet (2 mg total) by mouth daily. As directed (Patient taking differently: Take 2 mg by mouth 4 (four) times a week. As directed) 30 tablet 3  . warfarin (COUMADIN) 3 MG tablet Take as directed by coumadin clinic (Patient taking differently: Take 3 mg by mouth daily. mon-fri. Take as directed by coumadin clinic) 20 tablet 3   No current facility-administered medications for this visit.     OBJECTIVE: Vitals:   07/20/16 1438  BP: 133/85  Pulse: 66  Resp: 18  Temp: 97.3 F (36.3 C)     Body mass index is 22.86 kg/m.    ECOG FS:0 - Asymptomatic  General: Well-developed, well-nourished, no acute distress. Eyes: Pink conjunctiva, anicteric sclera. HEENT: Normocephalic, moist mucous membranes, clear oropharnyx. Lungs: Clear to auscultation bilaterally. Heart: Regular rate and rhythm. No rubs, murmurs, or gallops. Abdomen: Soft, nontender, nondistended. No organomegaly noted, normoactive bowel sounds. Musculoskeletal: No edema, cyanosis, or clubbing. Neuro: Alert, answering all questions appropriately. Cranial nerves grossly intact. Skin: No rashes or petechiae noted. Psych: Normal affect. Lymphatics: No cervical, calvicular, axillary or inguinal LAD.   LAB RESULTS:  Lab Results  Component  Value Date   NA 137 06/08/2014   K 5.3 (H) 06/08/2014   CL 104 06/08/2014   CO2 25 06/08/2014   GLUCOSE 111 (H) 06/08/2014   BUN 12 06/08/2014   CREATININE 0.79 06/08/2014   CALCIUM 8.4 (L) 06/08/2014   PROT 6.3 (L) 06/08/2014   ALBUMIN 3.2 (L) 06/08/2014   AST 35 06/08/2014   ALT 40 06/08/2014   ALKPHOS 89 06/08/2014   BILITOT 0.7 06/08/2014   GFRNONAA >60 06/08/2014   GFRAA >60 06/08/2014    Lab Results  Component Value Date   WBC 7.2 07/20/2016   NEUTROABS 3.6 07/20/2016   HGB 17.3 (H) 07/20/2016   HCT 48.8 (H) 07/20/2016   MCV 89.3 07/20/2016   PLT 185 07/20/2016     STUDIES: No results found.  ASSESSMENT: Hereditary hemochromatosis, CLL.  PLAN:    1. Hereditary hemochromatosis: Patient has not had  phlebotomy and at least 3-4 years. Her hemoglobin has trended up and is 17.3 today. Iron stores are pending at time of dictation. Return to clinic in 1 week for phlebotomy. We will base the need for additional phlebotomy once iron stores are resulted. Patient will return to clinic in 3 months for repeat laboratory work and further evaluation. 2. CLL: By report, diagnosed by bone marrow biopsy. Patient's white blood cell count continues to be within normal limits and she does not have a lymphocytosis. Peripheral blood flow cytometry was repeated today for completeness. No intervention is needed at this time. Return to clinic as above.  Patient expressed understanding and was in agreement with this plan. She also understands that She can call clinic at any time with any questions, concerns, or complaints.    Lloyd Huger, MD   07/20/2016 4:23 PM

## 2016-07-19 ENCOUNTER — Ambulatory Visit (INDEPENDENT_AMBULATORY_CARE_PROVIDER_SITE_OTHER): Payer: Medicare Other | Admitting: Obstetrics and Gynecology

## 2016-07-19 ENCOUNTER — Encounter: Payer: Self-pay | Admitting: Obstetrics and Gynecology

## 2016-07-19 VITALS — BP 137/72 | HR 68 | Ht 62.0 in | Wt 124.7 lb

## 2016-07-19 DIAGNOSIS — N952 Postmenopausal atrophic vaginitis: Secondary | ICD-10-CM

## 2016-07-19 DIAGNOSIS — N3946 Mixed incontinence: Secondary | ICD-10-CM | POA: Diagnosis not present

## 2016-07-19 DIAGNOSIS — Z90710 Acquired absence of both cervix and uterus: Secondary | ICD-10-CM

## 2016-07-19 DIAGNOSIS — N3942 Incontinence without sensory awareness: Secondary | ICD-10-CM | POA: Diagnosis not present

## 2016-07-19 DIAGNOSIS — N816 Rectocele: Secondary | ICD-10-CM | POA: Diagnosis not present

## 2016-07-19 DIAGNOSIS — N3941 Urge incontinence: Secondary | ICD-10-CM | POA: Insufficient documentation

## 2016-07-19 MED ORDER — ESTRADIOL 0.1 MG/GM VA CREA: 0.2500 | TOPICAL_CREAM | Freq: Every day | VAGINAL | 12 refills | Status: DC

## 2016-07-19 NOTE — Patient Instructions (Addendum)
1. Insert estrogen cream intravaginal every night for 30 nights, then twice a week 2. Return in 4 weeks for pessary fitting 3. Continue with Colace and water intake to maintain bowel irregularity

## 2016-07-19 NOTE — Progress Notes (Signed)
Joyce Mars, MD

## 2016-07-19 NOTE — Progress Notes (Signed)
GYN ENCOUNTER NOTE  Subjective:       [name] is a 82 y.o. G57P2002 female is here for gynecologic evaluation of the following issues:  1. Stress urinary incontinence 2. Urgent incontinence 3. Chronic constipation 4. Vaginal atrophy/ Menopause 5. Status post hysterectomy  Patient presents with urinary frequency, stress incontinence, and nocturia. Notes 10-12 episodes of urination through out the day and 3-4 episodes of nocturia each night.  States she has urgency with episodes of voiding. Will leak through out the day upon bending, coughing, sneezing, and right after voiding. Has some chronic constipation and incomplete defecation. Has no vaginal discharge or fullness/bulging sensation. Does have vaginal pain intermittently. PVR checked urology was minimal per history   Gynecologic History No LMP recorded. Patient has had a hysterectomy. Contraception: post menopausal status Last Pap: Unknown Last mammogram: 03/04/15. Results were: normal  Obstetric History OB History  Gravida Para Term Preterm AB Living  2 2 2     2   SAB TAB Ectopic Multiple Live Births          2    # Outcome Date GA Lbr Len/2nd Weight Sex Delivery Anes PTL Lv  2 Term 1958   7 lb 2.4 oz (3.243 kg) F Vag-Spont   LIV  1 Term 1948   6 lb 1.6 oz (2.767 kg) M Vag-Spont   LIV      Past Medical History:  Diagnosis Date  . Atrial fibrillation ([place]) 07/20/2009   Qualifier: Diagnosis of  By: Caryl Comes, MD, Leonidas Romberg Mack Guise   . C. difficile colitis    feces transplant  . Complete heart block  s/p av ablation    with atrial fibrillation. Pt is s/p AV nodal ablation due to permanent and symptomatic atrial fibrillation.   . Coronary artery disease    Nonobstructive  . Diverticulitis   . GERD (gastroesophageal reflux disease)   . Hereditary hemochromatosis    Phlebotomized in the past, not getting phlebotomy now  . Hyperlipidemia   . Hypertension   . Non Hodgkin's lymphoma ([place])    stable; followed by an  oncologist  . Osteoarthritis   . 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 Pacemaker-dual-chamber-Medtronic 04/04/2011    Past Surgical History:  Procedure Laterality Date  . ABDOMINAL HYSTERECTOMY    . ATRIAL ABLATION SURGERY  2008   AV nodal ablation  . BREAST BIOPSY  benign  . CARDIAC CATHETERIZATION    . CARDIOVERSION    . CHOLECYSTECTOMY    . COLONOSCOPY  oct.12, 2012  . INSERT / REPLACE / REMOVE PACEMAKER    . PACEMAKER GENERATOR CHANGE N/A 12/17/2012   Procedure: PACEMAKER GENERATOR CHANGE;  Surgeon: Deboraha Sprang, MD;  Location: [facility] CATH LAB;  Service: Cardiovascular;  Laterality: N/A;  . stool transplant    . US ECHOCARDIOGRAPHY  07/2008   EF 55%, mild LVH. No significant valvular abnormalities    Current Outpatient Prescriptions on File Prior to Visit  Medication Sig Dispense Refill  . ALPRAZolam (XANAX) 0.25 MG tablet Take 0.5 mg by mouth at bedtime as needed for sleep.    . Cetirizine HCl (ZYRTEC ALLERGY PO) Take by mouth daily.     . fluticasone (FLONASE) 50 MCG/ACT nasal spray Place 2 sprays into the nose daily as needed for allergies.     . folic acid (FOLVITE) 1 MG tablet Take 1 mg by mouth daily.      [name] gabapentin (NEURONTIN) 100 MG capsule Take 100 mg by mouth. Takes 1 tablet in the  am daily.    [name] gabapentin (NEURONTIN) 400 MG capsule Take 400 mg by mouth at bedtime.     . Pantoprazole Sodium (PROTONIX PO) Take 40 mg by mouth daily.    . polyvinyl alcohol (LIQUIFILM TEARS) 1.4 % ophthalmic solution Place 2 drops into both eyes 3 (three) times daily as needed (for dry eyes).    . warfarin (COUMADIN) 2 MG tablet Take 1 tablet (2 mg total) by mouth daily. As directed 30 tablet 3  . warfarin (COUMADIN) 3 MG tablet Take as directed by coumadin clinic 20 tablet 3   No current facility-administered medications on file prior to visit.     Allergies  Allergen Reactions  . Amoxicillin   . Cefdinir Diarrhea  . Cilostazol     Other reaction(s): Headache  . Levofloxacin   .  Plavix [Clopidogrel Bisulfate]     12/12/11 patient reports she has never taken Plavix before.   . Sulfur Nausea Only    Social History   Social History  . Marital status: Widowed    Spouse name: N/A  . Number of children: N/A  . Years of education: N/A   Occupational History  . Retired Retired   Social History Main Topics  . Smoking status: Former Smoker    Quit date: 1996  . Smokeless tobacco: Never Used  . Alcohol use No  . Drug use: No  . Sexual activity: Yes    Birth control/ protection: Surgical   Other Topics Concern  . Not on file   Social History Narrative  . No narrative on file    Family History  Problem Relation Age of Onset  . Liver disease Father   . Cancer Mother   . Prostate cancer Neg Hx   . Kidney disease Neg Hx   . Kidney cancer Neg Hx   . Breast cancer Neg Hx   . Ovarian cancer Neg Hx   . Colon cancer Neg Hx     The following portions of the patient's history were reviewed and updated as appropriate: allergies, current medications, past family history, past medical history, past social history, past surgical history and problem list.  Review of Systems Review of Systems - Negative except HPI Review of Systems - General ROS: negative for - chills, fatigue, fever, hot flashes, malaise or night sweats Hematological and Lymphatic ROS: negative for - bleeding problems or swollen lymph nodes Gastrointestinal ROS: negative for - abdominal pain, blood in stools, and nausea/vomiting Musculoskeletal ROS: negative for - joint pain, muscle pain or muscular weakness Genito-Urinary ROS: negative for - change in menstrual cycle, dysmenorrhea, dyspareunia, dysuria, genital discharge, genital ulcers, hematuria, irregular/heavy menses, or pelvic pain  Objective:   BP 137/72   Pulse 68   Ht 5\' 2"  (1.575 m)   Wt 124 lb 11.2 oz (56.6 kg)   BMI 22.81 kg/m  CONSTITUTIONAL: Well-developed, well-nourished female in no acute distress.  HENT:  Normocephalic,  atraumatic.  NECK: Not examined SKIN: Skin is warm and dry. No rash noted. Not diaphoretic. No erythema. No pallor. [place]: Alert and oriented to person, place, and time. PSYCHIATRIC: Normal mood and affect. Normal behavior. Normal judgment and thought content. CARDIOVASCULAR:Not Examined RESPIRATORY: Not Examined BREASTS: Not Examined ABDOMEN: Soft, non distended; Non tender.  No Organomegaly. PELVIC:  External Genitalia: Normal  BUS: Urethral caruncle  Vagina: Moderate to severe atrophy. Grade 1-2 rectocele. No cystocele or enterocele present  Cervix: Surgically absent  Uterus: Surgically absent  Adnexa: No masses or tenderness  RV:  Normal external exam  Bladder: Nontender MUSCULOSKELETAL: Normal range of motion. No tenderness.  No cyanosis, clubbing, or edema.     Assessment:   1. Mixed incontinence; Minimal response to [place]  2. Urinary incontinence without sensory awareness   3. Vaginal atrophy, moderate to severe   4. Rectocele   5. Status post hysterectomy      Plan:  Will begin Estrogen cream daily for 30 day, then twice a week then on.  Follow up in 1 month for pessary fitting Not an optimal surgical candidate Continue with stool softeners and water intake to maintain regularity of bowel movements  A total of 30 minutes were spent face-to-face with the patient during the encounter with greater than 50% dealing with counseling and coordination of care.    Grayland Ormond PA-S Brayton Mars, MD   I have seen, interviewed, and examined the patient in conjunction with the [facility].A. student and affirm the diagnosis and management plan. Martin A. DeFrancesco, MD, FACOG   Note: This dictation was prepared with Dragon dictation along with smaller phrase technology. Any transcriptional errors that result from this process are unintentional.

## 2016-07-20 ENCOUNTER — Other Ambulatory Visit: Payer: Self-pay

## 2016-07-20 ENCOUNTER — Inpatient Hospital Stay: Payer: Medicare Other | Admitting: *Deleted

## 2016-07-20 ENCOUNTER — Inpatient Hospital Stay: Payer: Medicare Other | Attending: Oncology | Admitting: Oncology

## 2016-07-20 ENCOUNTER — Encounter: Payer: Self-pay | Admitting: Oncology

## 2016-07-20 DIAGNOSIS — I4891 Unspecified atrial fibrillation: Secondary | ICD-10-CM | POA: Diagnosis not present

## 2016-07-20 DIAGNOSIS — Z79899 Other long term (current) drug therapy: Secondary | ICD-10-CM | POA: Insufficient documentation

## 2016-07-20 DIAGNOSIS — E785 Hyperlipidemia, unspecified: Secondary | ICD-10-CM | POA: Diagnosis not present

## 2016-07-20 DIAGNOSIS — I10 Essential (primary) hypertension: Secondary | ICD-10-CM | POA: Diagnosis not present

## 2016-07-20 DIAGNOSIS — C9110 Chronic lymphocytic leukemia of B-cell type not having achieved remission: Secondary | ICD-10-CM

## 2016-07-20 DIAGNOSIS — K5790 Diverticulosis of intestine, part unspecified, without perforation or abscess without bleeding: Secondary | ICD-10-CM | POA: Diagnosis not present

## 2016-07-20 DIAGNOSIS — M1990 Unspecified osteoarthritis, unspecified site: Secondary | ICD-10-CM | POA: Insufficient documentation

## 2016-07-20 DIAGNOSIS — I2510 Atherosclerotic heart disease of native coronary artery without angina pectoris: Secondary | ICD-10-CM

## 2016-07-20 DIAGNOSIS — Z87891 Personal history of nicotine dependence: Secondary | ICD-10-CM | POA: Insufficient documentation

## 2016-07-20 DIAGNOSIS — Z809 Family history of malignant neoplasm, unspecified: Secondary | ICD-10-CM | POA: Insufficient documentation

## 2016-07-20 DIAGNOSIS — K219 Gastro-esophageal reflux disease without esophagitis: Secondary | ICD-10-CM | POA: Insufficient documentation

## 2016-07-20 DIAGNOSIS — Z7901 Long term (current) use of anticoagulants: Secondary | ICD-10-CM | POA: Diagnosis not present

## 2016-07-20 DIAGNOSIS — Z800 Family history of malignant neoplasm of digestive organs: Secondary | ICD-10-CM | POA: Insufficient documentation

## 2016-07-20 LAB — IRON AND TIBC
IRON: 125 ug/dL (ref 28–170)
Saturation Ratios: 42 % — ABNORMAL HIGH (ref 10.4–31.8)
TIBC: 295 ug/dL (ref 250–450)
UIBC: 170 ug/dL

## 2016-07-20 LAB — CBC WITH DIFFERENTIAL/PLATELET
BASOS ABS: 0.1 10*3/uL (ref 0–0.1)
Basophils Relative: 1 %
EOS PCT: 1 %
Eosinophils Absolute: 0.1 10*3/uL (ref 0–0.7)
HEMATOCRIT: 48.8 % — AB (ref 35.0–47.0)
Hemoglobin: 17.3 g/dL — ABNORMAL HIGH (ref 12.0–16.0)
LYMPHS ABS: 2.7 10*3/uL (ref 1.0–3.6)
LYMPHS PCT: 38 %
MCH: 31.6 pg (ref 26.0–34.0)
MCHC: 35.3 g/dL (ref 32.0–36.0)
MCV: 89.3 fL (ref 80.0–100.0)
MONO ABS: 0.7 10*3/uL (ref 0.2–0.9)
MONOS PCT: 10 %
NEUTROS ABS: 3.6 10*3/uL (ref 1.4–6.5)
Neutrophils Relative %: 50 %
PLATELETS: 185 10*3/uL (ref 150–440)
RBC: 5.46 MIL/uL — ABNORMAL HIGH (ref 3.80–5.20)
RDW: 13.3 % (ref 11.5–14.5)
WBC: 7.2 10*3/uL (ref 3.6–11.0)

## 2016-07-20 LAB — FERRITIN: FERRITIN: 177 ng/mL (ref 11–307)

## 2016-07-20 NOTE — Progress Notes (Signed)
Pt was a former pt of [facility] app[facility] 3 years ago and pt did not keep follow up appts. Pt is sent back to the cancer center to follow up hemochromatosis.

## 2016-07-22 ENCOUNTER — Other Ambulatory Visit: Payer: Medicare Other

## 2016-07-26 LAB — COMP PANEL: LEUKEMIA/LYMPHOMA: Immunophenotypic Profile: 3

## 2016-07-28 ENCOUNTER — Inpatient Hospital Stay: Payer: Medicare Other

## 2016-08-03 ENCOUNTER — Ambulatory Visit (INDEPENDENT_AMBULATORY_CARE_PROVIDER_SITE_OTHER): Payer: Medicare Other

## 2016-08-03 DIAGNOSIS — Z7901 Long term (current) use of anticoagulants: Secondary | ICD-10-CM

## 2016-08-03 DIAGNOSIS — Z5181 Encounter for therapeutic drug level monitoring: Secondary | ICD-10-CM

## 2016-08-03 DIAGNOSIS — I4891 Unspecified atrial fibrillation: Secondary | ICD-10-CM

## 2016-08-03 LAB — POCT INR: INR: 3

## 2016-08-05 ENCOUNTER — Ambulatory Visit: Payer: Medicare Other | Admitting: Urology

## 2016-08-17 ENCOUNTER — Encounter: Payer: Medicare Other | Admitting: Obstetrics and Gynecology

## 2016-08-18 ENCOUNTER — Ambulatory Visit: Payer: Medicare Other | Admitting: Urology

## 2016-08-24 ENCOUNTER — Ambulatory Visit (INDEPENDENT_AMBULATORY_CARE_PROVIDER_SITE_OTHER): Payer: Medicare Other

## 2016-08-24 DIAGNOSIS — Z5181 Encounter for therapeutic drug level monitoring: Secondary | ICD-10-CM | POA: Diagnosis not present

## 2016-08-24 DIAGNOSIS — Z7901 Long term (current) use of anticoagulants: Secondary | ICD-10-CM | POA: Diagnosis not present

## 2016-08-24 DIAGNOSIS — I4891 Unspecified atrial fibrillation: Secondary | ICD-10-CM | POA: Diagnosis not present

## 2016-08-24 LAB — POCT INR: INR: 2.3

## 2016-08-28 ENCOUNTER — Inpatient Hospital Stay
Admission: EM | Admit: 2016-08-28 | Discharge: 2016-09-01 | DRG: 372 | Disposition: A | Discharge status: Home / Self Care | Payer: Medicare Other | Attending: Internal Medicine | Admitting: Internal Medicine

## 2016-08-28 ENCOUNTER — Encounter: Payer: Self-pay | Admitting: Emergency Medicine

## 2016-08-28 DIAGNOSIS — E871 Hypo-osmolality and hyponatremia: Secondary | ICD-10-CM

## 2016-08-28 DIAGNOSIS — I10 Essential (primary) hypertension: Secondary | ICD-10-CM | POA: Diagnosis present

## 2016-08-28 DIAGNOSIS — R2681 Unsteadiness on feet: Secondary | ICD-10-CM

## 2016-08-28 DIAGNOSIS — D689 Coagulation defect, unspecified: Secondary | ICD-10-CM | POA: Diagnosis present

## 2016-08-28 DIAGNOSIS — G629 Polyneuropathy, unspecified: Secondary | ICD-10-CM | POA: Diagnosis present

## 2016-08-28 DIAGNOSIS — Z90710 Acquired absence of both cervix and uterus: Secondary | ICD-10-CM | POA: Diagnosis not present

## 2016-08-28 DIAGNOSIS — Z7951 Long term (current) use of inhaled steroids: Secondary | ICD-10-CM | POA: Diagnosis not present

## 2016-08-28 DIAGNOSIS — I959 Hypotension, unspecified: Secondary | ICD-10-CM

## 2016-08-28 DIAGNOSIS — A0471 Enterocolitis due to Clostridium difficile, recurrent: Secondary | ICD-10-CM | POA: Diagnosis not present

## 2016-08-28 DIAGNOSIS — Z9049 Acquired absence of other specified parts of digestive tract: Secondary | ICD-10-CM

## 2016-08-28 DIAGNOSIS — H8109 Meniere's disease, unspecified ear: Secondary | ICD-10-CM

## 2016-08-28 DIAGNOSIS — K219 Gastro-esophageal reflux disease without esophagitis: Secondary | ICD-10-CM | POA: Diagnosis present

## 2016-08-28 DIAGNOSIS — I2510 Atherosclerotic heart disease of native coronary artery without angina pectoris: Secondary | ICD-10-CM | POA: Diagnosis present

## 2016-08-28 DIAGNOSIS — R001 Bradycardia, unspecified: Secondary | ICD-10-CM | POA: Diagnosis present

## 2016-08-28 DIAGNOSIS — Z888 Allergy status to other drugs, medicaments and biological substances status: Secondary | ICD-10-CM

## 2016-08-28 DIAGNOSIS — Z87891 Personal history of nicotine dependence: Secondary | ICD-10-CM

## 2016-08-28 DIAGNOSIS — M1990 Unspecified osteoarthritis, unspecified site: Secondary | ICD-10-CM | POA: Diagnosis present

## 2016-08-28 DIAGNOSIS — Z8572 Personal history of non-Hodgkin lymphomas: Secondary | ICD-10-CM | POA: Diagnosis not present

## 2016-08-28 DIAGNOSIS — D72829 Elevated white blood cell count, unspecified: Secondary | ICD-10-CM

## 2016-08-28 DIAGNOSIS — E876 Hypokalemia: Secondary | ICD-10-CM

## 2016-08-28 DIAGNOSIS — Z809 Family history of malignant neoplasm, unspecified: Secondary | ICD-10-CM | POA: Diagnosis not present

## 2016-08-28 DIAGNOSIS — Z7901 Long term (current) use of anticoagulants: Secondary | ICD-10-CM

## 2016-08-28 DIAGNOSIS — R197 Diarrhea, unspecified: Secondary | ICD-10-CM

## 2016-08-28 DIAGNOSIS — Z79899 Other long term (current) drug therapy: Secondary | ICD-10-CM

## 2016-08-28 DIAGNOSIS — Z9889 Other specified postprocedural states: Secondary | ICD-10-CM | POA: Diagnosis not present

## 2016-08-28 DIAGNOSIS — E860 Dehydration: Secondary | ICD-10-CM

## 2016-08-28 DIAGNOSIS — K862 Cyst of pancreas: Secondary | ICD-10-CM

## 2016-08-28 DIAGNOSIS — Z880 Allergy status to penicillin: Secondary | ICD-10-CM

## 2016-08-28 DIAGNOSIS — A0472 Enterocolitis due to Clostridium difficile, not specified as recurrent: Secondary | ICD-10-CM | POA: Diagnosis present

## 2016-08-28 DIAGNOSIS — Z950 Presence of cardiac pacemaker: Secondary | ICD-10-CM | POA: Diagnosis not present

## 2016-08-28 DIAGNOSIS — R1011 Right upper quadrant pain: Secondary | ICD-10-CM

## 2016-08-28 DIAGNOSIS — Z66 Do not resuscitate: Secondary | ICD-10-CM | POA: Diagnosis present

## 2016-08-28 LAB — URINALYSIS COMPLETE WITH MICROSCOPIC (ARMC ONLY)
BACTERIA UA: NONE SEEN
BILIRUBIN URINE: NEGATIVE
Glucose, UA: >500 mg/dL — AB
HGB URINE DIPSTICK: NEGATIVE
KETONES UR: NEGATIVE mg/dL
LEUKOCYTES UA: NEGATIVE
NITRITE: NEGATIVE
PH: 5 (ref 5.0–8.0)
PROTEIN: NEGATIVE mg/dL
RBC / HPF: NONE SEEN RBC/hpf (ref 0–5)
Specific Gravity, Urine: 1.002 — ABNORMAL LOW (ref 1.005–1.030)
WBC, UA: NONE SEEN WBC/hpf (ref 0–5)

## 2016-08-28 LAB — COMPREHENSIVE METABOLIC PANEL
ALBUMIN: 4.8 g/dL (ref 3.5–5.0)
ALK PHOS: 109 U/L (ref 38–126)
ALT: 32 U/L (ref 14–54)
AST: 53 U/L — AB (ref 15–41)
Anion gap: 12 (ref 5–15)
BILIRUBIN TOTAL: 2.2 mg/dL — AB (ref 0.3–1.2)
BUN: 9 mg/dL (ref 6–20)
CALCIUM: 9 mg/dL (ref 8.9–10.3)
CO2: 21 mmol/L — ABNORMAL LOW (ref 22–32)
CREATININE: 0.77 mg/dL (ref 0.44–1.00)
Chloride: 95 mmol/L — ABNORMAL LOW (ref 101–111)
GFR calc Af Amer: >60 mL/min (ref >60)
GFR calc non Af Amer: >60 mL/min (ref >60)
GLUCOSE: 136 mg/dL — AB (ref 65–99)
Potassium: 4.4 mmol/L (ref 3.5–5.1)
Sodium: 128 mmol/L — ABNORMAL LOW (ref 135–145)
TOTAL PROTEIN: 7.9 g/dL (ref 6.5–8.1)

## 2016-08-28 LAB — CBC
HEMATOCRIT: 50 % — AB (ref 35.0–47.0)
HEMOGLOBIN: 17.6 g/dL — AB (ref 12.0–16.0)
MCH: 39.5 pg — ABNORMAL HIGH (ref 26.0–34.0)
MCHC: 35.2 g/dL (ref 32.0–36.0)
MCV: 100.8 fL — AB (ref 80.0–100.0)
Platelets: 205 10*3/uL (ref 150–440)
RBC: 4.45 MIL/uL (ref 3.80–5.20)
RDW: 14 % (ref 11.5–14.5)
WBC: 16.8 10*3/uL — AB (ref 3.6–11.0)

## 2016-08-28 LAB — LIPASE, BLOOD: Lipase: 53 U/L — ABNORMAL HIGH (ref 11–51)

## 2016-08-28 MED ORDER — GABAPENTIN 100 MG PO CAPS
100.0000 mg | ORAL_CAPSULE | Freq: Every morning | ORAL | Status: DC
  Administered 2016-08-29 – 2016-09-01 (×4): 100 mg via ORAL
  Filled 2016-08-28 (×4): qty 1

## 2016-08-28 MED ORDER — ACETAMINOPHEN 325 MG PO TABS: 650.0000 mg | ORAL_TABLET | Freq: Four times a day (QID) | ORAL | Status: DC | PRN

## 2016-08-28 MED ORDER — WARFARIN SODIUM 2 MG PO TABS: 2.0000 mg | ORAL_TABLET | Freq: Every day | ORAL | Status: DC

## 2016-08-28 MED ORDER — CETIRIZINE HCL 10 MG PO TABS
10.0000 mg | ORAL_TABLET | Freq: Every day | ORAL | Status: DC
  Administered 2016-08-29 – 2016-09-01 (×4): 10 mg via ORAL
  Filled 2016-08-28 (×5): qty 1

## 2016-08-28 MED ORDER — AMLODIPINE BESYLATE 5 MG PO TABS: 5.0000 mg | ORAL_TABLET | Freq: Every day | ORAL | Status: DC

## 2016-08-28 MED ORDER — VANCOMYCIN 50 MG/ML ORAL SOLUTION
250.0000 mg | Freq: Four times a day (QID) | ORAL | Status: DC
  Administered 2016-08-28 – 2016-09-01 (×14): 250 mg via ORAL
  Filled 2016-08-28 (×19): qty 5

## 2016-08-28 MED ORDER — FOLIC ACID 1 MG PO TABS
1.0000 mg | ORAL_TABLET | Freq: Every day | ORAL | Status: DC
  Administered 2016-08-29 – 2016-09-01 (×4): 1 mg via ORAL
  Filled 2016-08-28 (×4): qty 1

## 2016-08-28 MED ORDER — ALPRAZOLAM 0.5 MG PO TABS
0.5000 mg | ORAL_TABLET | Freq: Every evening | ORAL | Status: DC | PRN
  Administered 2016-08-28 – 2016-08-31 (×3): 0.5 mg via ORAL
  Filled 2016-08-28 (×4): qty 1

## 2016-08-28 MED ORDER — FLUTICASONE PROPIONATE 50 MCG/ACT NA SUSP
2.0000 | Freq: Every day | NASAL | Status: DC | PRN
  Filled 2016-08-28: qty 16

## 2016-08-28 MED ORDER — GABAPENTIN 400 MG PO CAPS
400.0000 mg | ORAL_CAPSULE | Freq: Every day | ORAL | Status: DC
  Administered 2016-08-28 – 2016-08-31 (×4): 400 mg via ORAL
  Filled 2016-08-28 (×4): qty 1

## 2016-08-28 MED ORDER — SODIUM CHLORIDE 0.9 % IV SOLN
Freq: Once | INTRAVENOUS | Status: AC
  Administered 2016-08-28: 20:00:00 via INTRAVENOUS

## 2016-08-28 MED ORDER — POLYVINYL ALCOHOL 1.4 % OP SOLN
2.0000 [drp] | Freq: Three times a day (TID) | OPHTHALMIC | Status: DC | PRN
  Filled 2016-08-28: qty 15

## 2016-08-28 MED ORDER — ACETAMINOPHEN 650 MG RE SUPP: 650.0000 mg | Freq: Four times a day (QID) | RECTAL | Status: DC | PRN

## 2016-08-28 MED ORDER — SODIUM CHLORIDE 0.9 % IV SOLN
INTRAVENOUS | Status: DC
  Administered 2016-08-28: 23:00:00 via INTRAVENOUS

## 2016-08-28 NOTE — ED Provider Notes (Signed)
[facility] Emergency Department Provider Note  Time seen: 6:33 PM  I have reviewed the triage vital signs and the nursing notes.   HISTORY  Chief Complaint GI Problem (C Diff)    HPI AMIYRA Snyder is a 82 y.o. female with a past medical history of a fibrillation on Coumadin, hypertension, hyperlipidemia, recurrent C. difficile, who presents the emergency department with significant diarrhea. According to the patient and record review the patient was diagnosed with C. difficile 08/05/16. Patient underwent 14 days of antibiotics. Patient states she was improving however today states the diarrhea has restarted, 10-15 episodes of diarrhea today and he denies any abdominal pain. Denies any black or bloody stool. States some chills. Does state a feeling of generalized weakness. Patient states she required a fecal transplantation in 2013. Patient has been following up with GI medicine, Dr. Vira Agar for her C. difficile.  Past Medical History:  Diagnosis Date  . Atrial fibrillation ([place]) 07/20/2009   Qualifier: Diagnosis of  By: Caryl Comes, MD, Leonidas Romberg Mack Guise   . C. difficile colitis    feces transplant  . Complete heart block  s/p av ablation    with atrial fibrillation. Pt is s/p AV nodal ablation due to permanent and symptomatic atrial fibrillation.   . Coronary artery disease    Nonobstructive  . Diverticulitis   . GERD (gastroesophageal reflux disease)   . Hereditary hemochromatosis ([place])    Phlebotomized in the past, not getting phlebotomy now  . Hyperlipidemia   . Hypertension   . Non Hodgkin's lymphoma ([place])    stable; followed by an oncologist  . Osteoarthritis   . 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 Pacemaker-dual-chamber-Medtronic 04/04/2011    Patient Active Problem List   Diagnosis Date Noted  . Mixed incontinence 07/19/2016  . Urinary incontinence without sensory awareness 07/19/2016  . Vaginal atrophy 07/19/2016  . Rectocele 07/19/2016  . Status  post hysterectomy 07/19/2016  . Arthritis 04/13/2016  . Gastroesophageal reflux disease with hiatal hernia 04/13/2016  . HLD (hyperlipidemia) 04/13/2016  . Neuropathy ([place]) 04/13/2016  . Hereditary hemochromatosis ([place]) 11/26/2015  . Blood glucose elevated 11/26/2015  . Avitaminosis D 11/26/2015  . Arthritis, degenerative 09/08/2014  . Abnormal blood sugar 05/13/2014  . Contracture of palmar fascia (Dupuytren's) 04/15/2014  . Benign cystic mucinous tumor 04/15/2014  . Triggering of digit 04/15/2014  . Chronic lymphocytic leukemia ([place]) 04/01/2014  . Calculous cholecystitis 02/05/2014  . Cervical pain 02/05/2014  . Asthma-chronic obstructive pulmonary disease overlap syndrome ([place]) 02/05/2014  . LBP (low back pain) 02/05/2014  . 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 Post cardiac operation functional disturbance 01/24/2014  . Encounter for therapeutic drug monitoring 11/27/2013  . Encounter for therapeutic drug level monitoring 11/27/2013  . Abdominal mass 06/20/2013  . Central retinal edema, cystoid 12/24/2012  . Atrioventricular block, complete s/p AV ablation 12/11/2012  . Complete atrioventricular block ([place]) 12/11/2012  . H/O cataract extraction 10/08/2012  . Retinal telangiectasis 08/20/2012  . Pseudoaphakia 08/20/2012  . PVC (premature ventricular contraction) 08/14/2012  . Hypertension 05/09/2012  . Essential (primary) hypertension 05/09/2012  . Sleep-disordered breathing 04/04/2011  . Pacemaker-dual-chamber-Medtronic 04/04/2011  . Artificial cardiac pacemaker 04/04/2011  . Long term (current) use of anticoagulants 02/09/2011  . Long term current use of anticoagulant 02/09/2011  . HYPERLIPIDEMIA-MIXED 12/09/2009  . ATRIAL FIBRILLATION 07/20/2009    Past Surgical History:  Procedure Laterality Date  . ABDOMINAL HYSTERECTOMY    . ATRIAL ABLATION SURGERY  2008   AV nodal ablation  . BREAST BIOPSY  benign  . CARDIAC CATHETERIZATION    . CARDIOVERSION    . CHOLECYSTECTOMY     . COLONOSCOPY  oct.12, 2012  . INSERT / REPLACE / REMOVE PACEMAKER    . PACEMAKER GENERATOR CHANGE N/A 12/17/2012   Procedure: PACEMAKER GENERATOR CHANGE;  Surgeon: Deboraha Sprang, MD;  Location: [facility] CATH LAB;  Service: Cardiovascular;  Laterality: N/A;  . stool transplant    . US ECHOCARDIOGRAPHY  07/2008   EF 55%, mild LVH. No significant valvular abnormalities    Prior to Admission medications   Medication Sig Start Date End Date Taking? Authorizing Provider  ALPRAZolam Duanne Moron) 0.25 MG tablet Take 0.5 mg by mouth at bedtime as needed for sleep.    Historical Provider, MD  amLODipine (NORVASC) 5 MG tablet TAKE ONE TABLET BY MOUTH ONCE DAILY 01/04/16   Historical Provider, MD  Cetirizine HCl (ZYRTEC ALLERGY PO) Take by mouth daily.     Historical Provider, MD  fluticasone (FLONASE) 50 MCG/ACT nasal spray Place 2 sprays into the nose daily as needed for allergies.     Historical Provider, MD  folic acid (FOLVITE) 1 MG tablet Take 1 mg by mouth daily.      Historical Provider, MD  gabapentin (NEURONTIN) 100 MG capsule Take 100 mg by mouth. Takes 1 tablet in the am daily.    Historical Provider, MD  gabapentin (NEURONTIN) 400 MG capsule Take 400 mg by mouth at bedtime.     Historical Provider, MD  Pantoprazole Sodium (PROTONIX PO) Take 40 mg by mouth daily.    Historical Provider, MD  polyvinyl alcohol (LIQUIFILM TEARS) 1.4 % ophthalmic solution Place 2 drops into both eyes 3 (three) times daily as needed (for dry eyes).    Historical Provider, MD  warfarin (COUMADIN) 2 MG tablet Take 1 tablet (2 mg total) by mouth daily. As directed Patient taking differently: Take 2 mg by mouth 4 (four) times a week. As directed 05/04/16   Minna Merritts, MD  warfarin (COUMADIN) 3 MG tablet Take as directed by coumadin clinic Patient taking differently: Take 3 mg by mouth daily. mon-fri. Take as directed by coumadin clinic 05/04/16   Minna Merritts, MD    Allergies  Allergen Reactions  . Amoxicillin   .  Cefdinir Diarrhea  . Cilostazol     Other reaction(s): Headache  . Levofloxacin   . Plavix [Clopidogrel Bisulfate]     12/12/11 patient reports she has never taken Plavix before.   . Sulfur Nausea Only    Family History  Problem Relation Age of Onset  . Liver disease Father   . Cancer Mother   . Prostate cancer Neg Hx   . Kidney disease Neg Hx   . Kidney cancer Neg Hx   . Breast cancer Neg Hx   . Ovarian cancer Neg Hx   . Colon cancer Neg Hx     Social History Social History  Substance Use Topics  . Smoking status: Former Smoker    Quit date: 1996  . Smokeless tobacco: Never Used  . Alcohol use No    Review of Systems Constitutional: Negative for fever. Cardiovascular: Negative for chest pain. Respiratory: Negative for shortness of breath. Gastrointestinal: Negative for abdominal pain. Positive for diarrhea. Musculoskeletal: Negative for back pain. Neurological: Negative for headache 10-point ROS otherwise negative.  ____________________________________________   PHYSICAL EXAM:  VITAL SIGNS: ED Triage Vitals  Enc Vitals Group     BP 08/28/16 1813 (!) 127/52     Pulse Rate 08/28/16  1813 62     Resp 08/28/16 1813 13     Temp 08/28/16 1813 98.2 F (36.8 C)     Temp Source 08/28/16 1813 Oral     SpO2 08/28/16 1813 96 %     Weight 08/28/16 1816 122 lb (55.3 kg)     Height 08/28/16 1816 5\' 2"  (1.575 m)     Head Circumference --      Peak Flow --      Pain Score --      Pain Loc --      Pain Edu? --      Excl. in [place]? --     Constitutional: Alert and oriented. Well appearing and in no distress. Eyes: Normal exam ENT   Head: Normocephalic and atraumatic.   Mouth/Throat: Mucous membranes are moist. Cardiovascular: Normal rate, regular rhythm. No murmur Respiratory: Normal respiratory effort without tachypnea nor retractions. Breath sounds are clear  Gastrointestinal: Soft and nontender. No distention. Musculoskeletal: Nontender with normal range of  motion in all extremities. Neurologic:  Normal speech and language. No gross focal neurologic deficits Skin:  Skin is warm, dry and intact.  Psychiatric: Mood and affect are normal.   ____________________________________________   INITIAL IMPRESSION / ASSESSMENT AND PLAN / ED COURSE  Pertinent labs & imaging results that were available during my care of the patient were reviewed by me and considered in my medical decision making (see chart for details).  Patient presents the emergency department for significant diarrhea, weakness and chills. Patient recently diagnosed with Clostridium difficile. We will place on enteric precautions. We will check labs, IV hydrate and closing monitor in the emergency department. Given the patient's age, comorbidities, significant amount of diarrhea today with symptoms of generalized weakness I anticipate likely admission to the hospital for the patient.  Patient's labs show hyponatremia. Given patient's significant diarrhea with hyponatremia we'll admit to the hospital for further treatment.  ____________________________________________   FINAL CLINICAL IMPRESSION(S) / ED DIAGNOSES  C. difficile diarrhea    Harvest Dark, MD 08/28/16 2057

## 2016-08-28 NOTE — ED Notes (Signed)
Pt assisted to toilet in room. Pt ambulated with a steady gait.

## 2016-08-28 NOTE — H&P (Addendum)
[facility] [place] NAME: [name]    MR#:  [id number]  DATE OF BIRTH:  11-Mar-1928  DATE OF ADMISSION:  08/28/2016  PRIMARY CARE PHYSICIAN: Adin Hector, MD   REQUESTING/REFERRING PHYSICIAN: Dr Harvest Dark  CHIEF COMPLAINT:   Chief Complaint  Patient presents with  . GI Problem    C Diff    HISTORY OF PRESENT ILLNESS:  [name]  is a 82 y.o. female patient with recent diagnosis of C. difficile as outpatient. She states she completed 14 day course of oral vancomycin last Saturday. Today she was so sick and developed chills dizziness and had 12 loose bowel movements with abdominal pain on her left side. She has a history of C. difficile in the past back in 2013 with fecal transplant at that time. In the ER, she was found to have a low sodium and elevated white count and hospitalist services were contacted for further evaluation.  PAST MEDICAL HISTORY:   Past Medical History:  Diagnosis Date  . Atrial fibrillation ([place]) 07/20/2009   Qualifier: Diagnosis of  By: Caryl Comes, MD, Leonidas Romberg Mack Guise   . C. difficile colitis    feces transplant  . Complete heart block  s/p av ablation    with atrial fibrillation. Pt is s/p AV nodal ablation due to permanent and symptomatic atrial fibrillation.   . Coronary artery disease    Nonobstructive  . Diverticulitis   . GERD (gastroesophageal reflux disease)   . Hereditary hemochromatosis ([place])    Phlebotomized in the past, not getting phlebotomy now  . Hyperlipidemia   . Hypertension   . Non Hodgkin's lymphoma ([place])    stable; followed by an oncologist  . Osteoarthritis   . 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 Pacemaker-dual-chamber-Medtronic 04/04/2011    PAST SURGICAL HISTORY:   Past Surgical History:  Procedure Laterality Date  . ABDOMINAL HYSTERECTOMY    . ATRIAL ABLATION SURGERY  2008   AV nodal ablation  . BREAST BIOPSY  benign  . CARDIAC CATHETERIZATION    .  CARDIOVERSION    . CHOLECYSTECTOMY    . COLONOSCOPY  oct.12, 2012  . INSERT / REPLACE / REMOVE PACEMAKER    . PACEMAKER GENERATOR CHANGE N/A 12/17/2012   Procedure: PACEMAKER GENERATOR CHANGE;  Surgeon: Deboraha Sprang, MD;  Location: [facility] CATH LAB;  Service: Cardiovascular;  Laterality: N/A;  . stool transplant    . US ECHOCARDIOGRAPHY  07/2008   EF 55%, mild LVH. No significant valvular abnormalities    SOCIAL HISTORY:   Social History  Substance Use Topics  . Smoking status: Former Smoker    Quit date: 1996  . Smokeless tobacco: Never Used  . Alcohol use No    FAMILY HISTORY:   Family History  Problem Relation Age of Onset  . Liver disease Father   . Cancer Mother   . Prostate cancer Neg Hx   . Kidney disease Neg Hx   . Kidney cancer Neg Hx   . Breast cancer Neg Hx   . Ovarian cancer Neg Hx   . Colon cancer Neg Hx     DRUG ALLERGIES:   Allergies  Allergen Reactions  . Amoxicillin   . Cefdinir Diarrhea  . Cilostazol     Other reaction(s): Headache  . Levofloxacin   . Plavix [Clopidogrel Bisulfate]     12/12/11 patient reports she has never taken Plavix before.   . Sulfur Nausea Only    REVIEW OF  SYSTEMS:  CONSTITUTIONAL: No fever. Positive for chills. Positive for weight loss. Positive for fatigue.  EYES: No blurred or double vision.  EARS, NOSE, AND THROAT: No tinnitus or ear pain. No sore throat. Positive for runny nose RESPIRATORY: Positive for cough. No shortness of breath, wheezing or hemoptysis.  CARDIOVASCULAR: No chest pain, orthopnea, edema.  GASTROINTESTINAL: Positive for nausea, diarrhea and abdominal pain. No blood in bowel movements. GENITOURINARY: No dysuria, hematuria. Positive for urinary frequency  ENDOCRINE: No polyuria, nocturia,  HEMATOLOGY: No anemia, easy bruising or bleeding SKIN: No rash or lesion. MUSCULOSKELETAL: No joint pain or arthritis.   NEUROLOGIC: No tingling, numbness, weakness. Positive for dizziness. PSYCHIATRY: No anxiety  or depression.   MEDICATIONS AT HOME:   Prior to Admission medications   Medication Sig Start Date End Date Taking? Authorizing Provider  ALPRAZolam Duanne Moron) 0.25 MG tablet Take 0.5 mg by mouth at bedtime as needed for sleep.    Historical Provider, MD  amLODipine (NORVASC) 5 MG tablet TAKE ONE TABLET BY MOUTH ONCE DAILY 01/04/16   Historical Provider, MD  Cetirizine HCl (ZYRTEC ALLERGY PO) Take by mouth daily.     Historical Provider, MD  fluticasone (FLONASE) 50 MCG/ACT nasal spray Place 2 sprays into the nose daily as needed for allergies.     Historical Provider, MD  folic acid (FOLVITE) 1 MG tablet Take 1 mg by mouth daily.      Historical Provider, MD  gabapentin (NEURONTIN) 100 MG capsule Take 100 mg by mouth. Takes 1 tablet in the am daily.    Historical Provider, MD  gabapentin (NEURONTIN) 400 MG capsule Take 400 mg by mouth at bedtime.     Historical Provider, MD  Pantoprazole Sodium (PROTONIX PO) Take 40 mg by mouth daily.    Historical Provider, MD  polyvinyl alcohol (LIQUIFILM TEARS) 1.4 % ophthalmic solution Place 2 drops into both eyes 3 (three) times daily as needed (for dry eyes).    Historical Provider, MD  warfarin (COUMADIN) 2 MG tablet Take 1 tablet (2 mg total) by mouth daily. As directed Patient taking differently: Take 2 mg by mouth 4 (four) times a week. As directed 05/04/16   Minna Merritts, MD  warfarin (COUMADIN) 3 MG tablet Take as directed by coumadin clinic Patient taking differently: Take 3 mg by mouth daily. mon-fri. Take as directed by coumadin clinic 05/04/16   Minna Merritts, MD      VITAL SIGNS:  Blood pressure (!) 127/52, pulse 62, temperature 98.2 F (36.8 C), temperature source Oral, resp. rate 13, height 5\' 2"  (1.575 m), weight 55.3 kg (122 lb), SpO2 96 %.  PHYSICAL EXAMINATION:  GENERAL:  82 y.o.-year-old patient lying in the bed with no acute distress.  EYES: Pupils equal, round, reactive to light and accommodation. No scleral icterus. Extraocular  muscles intact.  HEENT: Head atraumatic, normocephalic. Oropharynx and nasopharynx clear.  NECK:  Supple, no jugular venous distention. No thyroid enlargement, no tenderness.  LUNGS: Normal breath sounds bilaterally, no wheezing, rales,rhonchi or crepitation. No use of accessory muscles of respiration.  CARDIOVASCULAR: S1, S2 normal. No murmurs, rubs, or gallops.  ABDOMEN: Soft, nontender, nondistended. Bowel sounds present. No organomegaly or mass.  EXTREMITIES: No pedal edema, cyanosis, or clubbing.  NEUROLOGIC: Cranial nerves II through XII are intact. Muscle strength 5/5 in all extremities. Sensation intact. Gait not checked.  PSYCHIATRIC: The patient is alert and oriented x 3.  SKIN: No rash, lesion, or ulcer.   LABORATORY PANEL:   CBC  Recent Labs  Lab 08/28/16 1931  WBC 16.8*  HGB 17.6*  HCT 50.0*  PLT 205   ------------------------------------------------------------------------------------------------------------------  Chemistries   Recent Labs Lab 08/28/16 1931  NA 128*  K 4.4  CL 95*  CO2 21*  GLUCOSE 136*  BUN 9  CREATININE 0.77  CALCIUM 9.0  AST 53*  ALT 32  ALKPHOS 109  BILITOT 2.2*   ------------------------------------------------------------------------------------------------------------------   IMPRESSION AND PLAN:   1. Recurrent colitis secondary to C. difficile. I will send off another stool sample. Start by mouth vancomycin. Try to consult Dr. Vira Agar gastroenterology for evaluation for stool transplant. Stop Protonix. 2. Hyponatremia. IV fluid hydration with normal saline 3. Atrial fibrillation on Coumadin. Send an INR tomorrow morning. Not done in the ER today. 4. Hemachromatosis with elevated hemoglobin and hematocrit. Patient follows with oncology for phlebotomies. 5. History of non-Hodgkin's lymphoma 6. Neuropathy on gabapentin 7. Essential hypertension on Norvasc  All the records are reviewed and case discussed with ED  provider. Management plans discussed with the patient, family and they are in agreement.  CODE STATUS: DO NOT RESUSCITATE  TOTAL TIME TAKING CARE OF THIS PATIENT: 50 minutes.    Loletha Grayer M.D on 08/28/2016 at 9:36 PM  Between 7am to 6pm - Pager - [phone]  After 6pm call admission pager [phone]  [facility] Office  [phone]  CC: Primary care physician; Adin Hector, MD

## 2016-08-28 NOTE — ED Triage Notes (Addendum)
Pt reports having Cdiff 3 years ago in which she was treated with a fecal transplant. Pt has since had Cdiff a second time that started 18 days ago, pt completed antibiotics 2-3 days ago. Pt denies abd distention. Pt reports about 10 episodes of diarrhea today with slight abd pain. Pt reports no blood in stool. Pt denies N&V. Pt is on blood thinners and has a pacemaker.

## 2016-08-29 LAB — BASIC METABOLIC PANEL
ANION GAP: 8 (ref 5–15)
BUN: 9 mg/dL (ref 6–20)
CHLORIDE: 106 mmol/L (ref 101–111)
CO2: 19 mmol/L — AB (ref 22–32)
CREATININE: 0.76 mg/dL (ref 0.44–1.00)
Calcium: 8.3 mg/dL — ABNORMAL LOW (ref 8.9–10.3)
GFR calc non Af Amer: >60 mL/min (ref >60)
Glucose, Bld: 148 mg/dL — ABNORMAL HIGH (ref 65–99)
Potassium: 3.4 mmol/L — ABNORMAL LOW (ref 3.5–5.1)
Sodium: 133 mmol/L — ABNORMAL LOW (ref 135–145)

## 2016-08-29 LAB — PROTIME-INR
INR: 3.3
Prothrombin Time: 34.3 seconds — ABNORMAL HIGH (ref 11.4–15.2)

## 2016-08-29 LAB — C DIFFICILE QUICK SCREEN W PCR REFLEX
C DIFFICILE (CDIFF) INTERP: NOT DETECTED
C DIFFICLE (CDIFF) ANTIGEN: NEGATIVE
C Diff toxin: NEGATIVE

## 2016-08-29 LAB — CBC
HCT: 45.6 % (ref 35.0–47.0)
HEMOGLOBIN: 16 g/dL (ref 12.0–16.0)
MCH: 32.1 pg (ref 26.0–34.0)
MCHC: 35 g/dL (ref 32.0–36.0)
MCV: 91.6 fL (ref 80.0–100.0)
Platelets: 160 10*3/uL (ref 150–440)
RBC: 4.97 MIL/uL (ref 3.80–5.20)
RDW: 13.8 % (ref 11.5–14.5)
WBC: 15.5 10*3/uL — ABNORMAL HIGH (ref 3.6–11.0)

## 2016-08-29 LAB — MAGNESIUM: Magnesium: 1.9 mg/dL (ref 1.7–2.4)

## 2016-08-29 LAB — LIPASE, BLOOD: Lipase: 42 U/L (ref 11–51)

## 2016-08-29 LAB — TSH: TSH: 1.985 u[IU]/mL (ref 0.350–4.500)

## 2016-08-29 MED ORDER — POTASSIUM CHLORIDE IN NACL 20-0.9 MEQ/L-% IV SOLN
INTRAVENOUS | Status: DC
  Administered 2016-08-29 – 2016-08-31 (×4): via INTRAVENOUS
  Filled 2016-08-29 (×12): qty 1000

## 2016-08-29 MED ORDER — SODIUM CHLORIDE 0.9 % IV BOLUS (SEPSIS)
1000.0000 mL | Freq: Once | INTRAVENOUS | Status: AC
  Administered 2016-08-29: 1000 mL via INTRAVENOUS

## 2016-08-29 MED ORDER — WARFARIN - PHARMACIST DOSING INPATIENT
Freq: Every day | Status: DC
  Administered 2016-08-31: 18:00:00

## 2016-08-29 NOTE — Progress Notes (Addendum)
[place] [facility] [place] NAME: [name]    MR#:  [id number]  DATE OF BIRTH:  1928/02/18  SUBJECTIVE:  CHIEF COMPLAINT:   Chief Complaint  Patient presents with  . GI Problem    C Diff   The patient is 82 year old Caucasian female with past medical history significant for history of C. difficile colitis 3 years ago, status post stool transplant, who presents to the hospital with complaints of diarrhea. Apparently patient completed 14 day course of oral vancomycin. A week ago, however, on the day of admission. She was checked. They had just dizziness and had at least 12 loose bowel movements and abdominal pain on the left side of her abdomen. On arrival to the emergency room she was noted to have hyponatremia and elevated white blood cell count and was admitted to the hospital. She continues to have diarrheal stool and discomfort in her upper abdomen, some nausea, unable to tolerate liquids. Gastroenterology is consulted.     Review of Systems  Gastrointestinal: Positive for abdominal pain, diarrhea and nausea.    VITAL SIGNS: Blood pressure (!) 102/47, pulse (!) 50, temperature 97.7 F (36.5 C), resp. rate 18, height 5\' 2"  (1.575 m), weight 54.5 kg (120 lb 1.6 oz), SpO2 94 %.  PHYSICAL EXAMINATION:   GENERAL:  82 y.o.-year-old patient lying in the bed in mild to moderate distress due to nausea and diarrhea, prostrated, sunken eyes.  EYES: Pupils equal, round, reactive to light and accommodation. No scleral icterus. Extraocular muscles intact.  HEENT: Head atraumatic, normocephalic. Oropharynx and nasopharynx clear.  NECK:  Supple, no jugular venous distention. No thyroid enlargement, no tenderness.  LUNGS: Normal breath sounds bilaterally, no wheezing, rales,rhonchi or crepitation. No use of accessory muscles of respiration.  CARDIOVASCULAR: S1, S2 normal. No murmurs, rubs, or gallops.  ABDOMEN: Soft, minimal discomfort on palpation  and upper abdomen, no rebound or guarding, nondistended. Bowel sounds present. No organomegaly or mass.  EXTREMITIES: No pedal edema, cyanosis, or clubbing.  NEUROLOGIC: Cranial nerves II through XII are intact. Muscle strength 5/5 in all extremities. Sensation intact. Gait not checked.  PSYCHIATRIC: The patient is alert and oriented x 3.  SKIN: No obvious rash, lesion, or ulcer.   ORDERS/RESULTS REVIEWED:   CBC  Recent Labs Lab 08/28/16 1931 08/29/16 0456  WBC 16.8* 15.5*  HGB 17.6* 16.0  HCT 50.0* 45.6  PLT 205 160  MCV 100.8* 91.6  MCH 39.5* 32.1  MCHC 35.2 35.0  RDW 14.0 13.8   ------------------------------------------------------------------------------------------------------------------  Chemistries   Recent Labs Lab 08/28/16 1931 08/29/16 0456  NA 128* 133*  K 4.4 3.4*  CL 95* 106  CO2 21* 19*  GLUCOSE 136* 148*  BUN 9 9  CREATININE 0.77 0.76  CALCIUM 9.0 8.3*  AST 53*  --   ALT 32  --   ALKPHOS 109  --   BILITOT 2.2*  --    ------------------------------------------------------------------------------------------------------------------ estimated creatinine clearance is 39.9 mL/min (by C-G formula based on SCr of 0.76 mg/dL). ------------------------------------------------------------------------------------------------------------------ No results for input(s): TSH, T4TOTAL, T3FREE, THYROIDAB in the last 72 hours.  Invalid input(s): FREET3  Cardiac Enzymes No results for input(s): CKMB, TROPONINI, MYOGLOBIN in the last 168 hours.  Invalid input(s): CK ------------------------------------------------------------------------------------------------------------------ Invalid input(s): POCBNP ---------------------------------------------------------------------------------------------------------------  RADIOLOGY: No results found.  EKG:  Orders placed or performed in visit on 03/08/16  . EKG 12-Lead    ASSESSMENT AND PLAN:  Active  Problems:   Recurrent colitis due to Clostridium difficile  #  1. Recurrent C. difficile colitis, continue vancomycin orally, IV fluids, so neurology consultation is requested for possible stool transplant, discussed with Dr. Vira Agar #2. Hypotension, advance IV fluids #3. Bradycardia, get TSH checked, patient is not on beta blockers or calcium channel blockers at home #4. Hyponatremia, some improved with therapy #5. Hypokalemia, supplement intravenously, check magnesium level #6. Leukocytosis, improved with therapy #7. Dehydration with hemoconcentration, improved with IV fluid administration, advance IV fluids #8. Coagulopathy with INR of 3.3, hold Coumadin #9 RUQ pain, get [place] to r/o GB disease, follow LFT's, lipase, continue IVF at high rate, effectively NPO    Management plans discussed with the patient, family and they are in agreement.   DRUG ALLERGIES:  Allergies  Allergen Reactions  . Amoxicillin   . Cefdinir Diarrhea  . Cilostazol     Other reaction(s): Headache  . Levofloxacin   . Plavix [Clopidogrel Bisulfate]     12/12/11 patient reports she has never taken Plavix before.   . Sulfur Nausea Only    CODE STATUS:     Code Status Orders        Start     Ordered   08/28/16 2136  Do not attempt resuscitation (DNR)  Continuous    Question Answer Comment  In the event of cardiac or respiratory ARREST Do not call a "code blue"   In the event of cardiac or respiratory ARREST Do not perform Intubation, CPR, defibrillation or ACLS   In the event of cardiac or respiratory ARREST Use medication by any route, position, wound care, and other measures to relive pain and suffering. May use oxygen, suction and manual treatment of airway obstruction as needed for comfort.   Comments nurse may pronounce      08/28/16 2136    Code Status History    Date Active Date Inactive Code Status Order ID Comments User Context   This patient has a current code status but no historical code  status.    Advance Directive Documentation   Flowsheet Row Most Recent Value  Type of Advance Directive  Healthcare Power of Attorney, Living will  Pre-existing out of facility DNR order (yellow form or pink MOST form)  No data  "MOST" Form in Place?  No data      TOTAL TIME TAKING CARE OF THIS PATIENT: 40 minutes.  Discussed with Dr. Rickey Primus M.D on 08/29/2016 at 2:04 PM  Between 7am to 6pm - Pager - [phone]  After 6pm go to [email or web address] - password [email or web address] [facility]  [facility]  Office  [phone]  CC: Primary care physician; Adin Hector, MD

## 2016-08-29 NOTE — Progress Notes (Signed)
ANTICOAGULATION CONSULT NOTE - Initial Consult  Pharmacy Consult for Warfarin dsoing Indication: atrial fibrillation  Allergies  Allergen Reactions  . Amoxicillin   . Cefdinir Diarrhea  . Cilostazol     Other reaction(s): Headache  . Levofloxacin   . Plavix [Clopidogrel Bisulfate]     12/12/11 patient reports she has never taken Plavix before.   . Sulfur Nausea Only    Patient Measurements: Height: 5\' 2"  (157.5 cm) Weight: 120 lb 1.6 oz (54.5 kg) IBW/kg (Calculated) : 50.1 Heparin Dosing Weight:    Vital Signs: Temp: 97.7 F (36.5 C) (10/30 0739) Temp Source: Oral (10/30 0413) BP: 102/47 (10/30 0739) Pulse Rate: 50 (10/30 0739)  Labs:  Recent Labs  08/28/16 1931 08/29/16 0456  HGB 17.6* 16.0  HCT 50.0* 45.6  PLT 205 160  LABPROT  --  34.3*  INR  --  3.30  CREATININE 0.77 0.76    Estimated Creatinine Clearance: 39.9 mL/min (by C-G formula based on SCr of 0.76 mg/dL).   Medical History: Past Medical History:  Diagnosis Date  . Atrial fibrillation ([place]) 07/20/2009   Qualifier: Diagnosis of  By: Caryl Comes, MD, Leonidas Romberg Mack Guise   . C. difficile colitis    feces transplant  . Complete heart block  s/p av ablation    with atrial fibrillation. Pt is s/p AV nodal ablation due to permanent and symptomatic atrial fibrillation.   . Coronary artery disease    Nonobstructive  . Diverticulitis   . GERD (gastroesophageal reflux disease)   . Hereditary hemochromatosis ([place])    Phlebotomized in the past, not getting phlebotomy now  . Hyperlipidemia   . Hypertension   . Non Hodgkin's lymphoma ([place])    stable; followed by an oncologist  . Osteoarthritis   . 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 Pacemaker-dual-chamber-Medtronic 04/04/2011    Medications:  PTA: Warfarin 3mg  on MWF and 4mg  on TThSS  Assessment: Patient admitted for recurrent C. Diff. She was taking Warfarin for Afib prior to admission. Pharmacy is consulted to manage Warfarin.  10/30 INR 3.30  Goal of  Therapy:  INR 2-3   Plan:  Will hold today's dose of warfarin and recheck daily INR values.  Paulina Fusi, PharmD, BCPS 08/29/2016 9:24 AM

## 2016-08-30 ENCOUNTER — Inpatient Hospital Stay: Payer: Medicare Other

## 2016-08-30 DIAGNOSIS — Z950 Presence of cardiac pacemaker: Secondary | ICD-10-CM

## 2016-08-30 LAB — BASIC METABOLIC PANEL
ANION GAP: 3 — AB (ref 5–15)
BUN: 6 mg/dL (ref 6–20)
CHLORIDE: 112 mmol/L — AB (ref 101–111)
CO2: 21 mmol/L — ABNORMAL LOW (ref 22–32)
Calcium: 8.3 mg/dL — ABNORMAL LOW (ref 8.9–10.3)
Creatinine, Ser: 0.71 mg/dL (ref 0.44–1.00)
GFR calc Af Amer: >60 mL/min (ref >60)
GLUCOSE: 113 mg/dL — AB (ref 65–99)
POTASSIUM: 3.9 mmol/L (ref 3.5–5.1)
SODIUM: 136 mmol/L (ref 135–145)

## 2016-08-30 LAB — HEPATIC FUNCTION PANEL
ALT: 22 U/L (ref 14–54)
AST: 26 U/L (ref 15–41)
Albumin: 3.3 g/dL — ABNORMAL LOW (ref 3.5–5.0)
Alkaline Phosphatase: 73 U/L (ref 38–126)
Bilirubin, Direct: 0.2 mg/dL (ref 0.1–0.5)
Indirect Bilirubin: 0.7 mg/dL (ref 0.3–0.9)
TOTAL PROTEIN: 5.6 g/dL — AB (ref 6.5–8.1)
Total Bilirubin: 0.9 mg/dL (ref 0.3–1.2)

## 2016-08-30 LAB — GASTROINTESTINAL PANEL BY PCR, STOOL (REPLACES STOOL CULTURE)

## 2016-08-30 LAB — PROTIME-INR
INR: 3.07
Prothrombin Time: 32.4 seconds — ABNORMAL HIGH (ref 11.4–15.2)

## 2016-08-30 NOTE — Progress Notes (Signed)
[facility] [place] NAME: [name]    MR#:  [id number]  DATE OF BIRTH:  07-Jun-1928  SUBJECTIVE:  CHIEF COMPLAINT:   Chief Complaint  Patient presents with  . GI Problem    C Diff   The patient is 82 year old Caucasian female with past medical history significant for history of C. difficile colitis 3 years ago, status post stool transplant, who presents to the hospital with complaints of diarrhea. Apparently patient completed 14 day course of oral vancomycin. A week ago, however, on the day of admission. She was checked. They had just dizziness and had at least 12 loose bowel movements and abdominal pain on the left side of her abdomen. On arrival to the emergency room she was noted to have hyponatremia and elevated white blood cell count and was admitted to the hospital. She continues to have diarrheal stool and discomfort in her upper abdomen, some nausea, But able to eat some full liquid diet. C. difficile studies came back negative. Gastrointestinal panel is still pending. Patient is on vancomycin orally. Gastroenterologist is planning flexible sigmoidoscopy tomorrow.     Review of Systems  Gastrointestinal: Positive for abdominal pain, diarrhea and nausea.    VITAL SIGNS: Blood pressure (!) 124/55, pulse 60, temperature 98.2 F (36.8 C), temperature source Oral, resp. rate 16, height 5\' 2"  (1.575 m), weight 54.5 kg (120 lb 1.6 oz), SpO2 95 %.  PHYSICAL EXAMINATION:   GENERAL:  82 y.o.-year-old patient lying in the bed in milddistress due to  diarrhea, but less comfortable, more hydrated appearance  EYES: Pupils equal, round, reactive to light and accommodation. No scleral icterus. Extraocular muscles intact.  HEENT: Head atraumatic, normocephalic. Oropharynx and nasopharynx clear.  NECK:  Supple, no jugular venous distention. No thyroid enlargement, no tenderness.  LUNGS: Normal breath sounds bilaterally, no wheezing,  rales,rhonchi or crepitation. No use of accessory muscles of respiration.  CARDIOVASCULAR: S1, S2 normal. No murmurs, rubs, or gallops.  ABDOMEN: Soft, minimal discomfort on palpation and upper abdomen, no rebound or guarding, nondistended. Bowel sounds present. No organomegaly or mass.  EXTREMITIES: No pedal edema, cyanosis, or clubbing.  NEUROLOGIC: Cranial nerves II through XII are intact. Muscle strength 5/5 in all extremities. Sensation intact. Gait not checked.  PSYCHIATRIC: The patient is alert and oriented x 3.  SKIN: No obvious rash, lesion, or ulcer.   ORDERS/RESULTS REVIEWED:   CBC  Recent Labs Lab 08/28/16 1931 08/29/16 0456  WBC 16.8* 15.5*  HGB 17.6* 16.0  HCT 50.0* 45.6  PLT 205 160  MCV 100.8* 91.6  MCH 39.5* 32.1  MCHC 35.2 35.0  RDW 14.0 13.8   ------------------------------------------------------------------------------------------------------------------  Chemistries   Recent Labs Lab 08/28/16 1931 08/29/16 0456 08/30/16 0358  NA 128* 133* 136  K 4.4 3.4* 3.9  CL 95* 106 112*  CO2 21* 19* 21*  GLUCOSE 136* 148* 113*  BUN 9 9 6   CREATININE 0.77 0.76 0.71  CALCIUM 9.0 8.3* 8.3*  MG  --  1.9  --   AST 53*  --  26  ALT 32  --  22  ALKPHOS 109  --  73  BILITOT 2.2*  --  0.9   ------------------------------------------------------------------------------------------------------------------ estimated creatinine clearance is 39.9 mL/min (by C-G formula based on SCr of 0.71 mg/dL). ------------------------------------------------------------------------------------------------------------------  Recent Labs  08/29/16 0456  TSH 1.985    Cardiac Enzymes No results for input(s): CKMB, TROPONINI, MYOGLOBIN in the last 168 hours.  Invalid input(s): CK ------------------------------------------------------------------------------------------------------------------ Invalid  input(s):  POCBNP ---------------------------------------------------------------------------------------------------------------  RADIOLOGY: US Abdomen Limited Ruq  Result Date: 08/30/2016 CLINICAL DATA:  Right upper quadrant pain for the past several months EXAM: US ABDOMEN LIMITED - RIGHT UPPER QUADRANT COMPARISON:  Abdominal and pelvic CT scan of June 05, 2014 FINDINGS: Gallbladder: The gallbladder is surgically absent. Common bile duct: Diameter: 2.2 mm Liver: The hepatic echotexture is normal. There is mild intrahepatic ductal dilation. There is a cystic appearing structure observed in the pancreatic head which has been previously demonstrated. It measures 2.3 x 1.7 x 2.5 cm. IMPRESSION: Mild intrahepatic ductal dilation. Normal caliber common bile duct. Cystic appearing focus in the pancreatic head which appears to have increased since the previous CT scan. Pancreatic protocol MRI is recommended. Electronically Signed   By: David  Martinique M.D.   On: 08/30/2016 09:36    EKG:  Orders placed or performed in visit on 03/08/16  . EKG 12-Lead    ASSESSMENT AND PLAN:  Active Problems:   Recurrent colitis due to Clostridium difficile  #1. Recurrent Diarrhea, stool cultures were negative for C. Difficile, discussed with gastroenterologist, Dr. Vira Agar,, continue vancomycin orally, IV fluids, flexible sigmoidoscopy tomorrow  #2. Hypotension, resolved on advanced IV fluids #3. Bradycardia, TSH was normal at 1.98, heart rate remained stable in 50s to 60s, patient is not on beta blockers or calcium channel blockers at home or in the hospital. Pacemaker generator was changed in 2014. We will ask for cardiologist input  #4. Hyponatremia, resolved with therapy #5. Hypokalemia, resolved, supplemented intravenously, magnesium level was found to be 1.9, marginal #6. Leukocytosis, some improved with therapy, follow in the morning #7. Dehydration with hemoconcentration, improved with IV fluid administration, follow  closely #8. Coagulopathy with INR of 3.3, holding Coumadin for likely an endoscopic procedure tomorrow #9 RUQ pain, [place] of right upper quadrant revealed mild intrahepatic ductal dilatation, normal common bile duct, cystic focus in pancreatic head, which may have increased since prior CT scan, MRCP was recommended, and impossible to get that done due to pacemaker, LFT's , lipase have improved, may benefit from repeating CT scan of the abdomen, endoscopic ultrasound, discussed with gastroenterologist  Management plans discussed with the patient, family and they are in agreement.   DRUG ALLERGIES:  Allergies  Allergen Reactions  . Amoxicillin   . Cefdinir Diarrhea  . Cilostazol     Other reaction(s): Headache  . Levofloxacin   . Plavix [Clopidogrel Bisulfate]     12/12/11 patient reports she has never taken Plavix before.   . Sulfur Nausea Only    CODE STATUS:     Code Status Orders        Start     Ordered   08/28/16 2136  Do not attempt resuscitation (DNR)  Continuous    Question Answer Comment  In the event of cardiac or respiratory ARREST Do not call a "code blue"   In the event of cardiac or respiratory ARREST Do not perform Intubation, CPR, defibrillation or ACLS   In the event of cardiac or respiratory ARREST Use medication by any route, position, wound care, and other measures to relive pain and suffering. May use oxygen, suction and manual treatment of airway obstruction as needed for comfort.   Comments nurse may pronounce      08/28/16 2136    Code Status History    Date Active Date Inactive Code Status Order ID Comments User Context   This patient has a current code status but no historical code status.    Advance  Directive Documentation   Flowsheet Row Most Recent Value  Type of Advance Directive  Healthcare Power of Attorney, Living will  Pre-existing out of facility DNR order (yellow form or pink MOST form)  No data  "MOST" Form in Place?  No data      TOTAL  TIME TAKING CARE OF THIS PATIENT: 40 minutes.  Discussed with Dr. Rickey Primus M.D on 08/30/2016 at 3:06 PM  Between 7am to 6pm - Pager - [phone]  After 6pm go to [email or web address] - password [email or web address] [facility]  [place] [facility]  Office  [phone]  CC: Primary care physician; Adin Hector, MD

## 2016-08-30 NOTE — Consult Note (Signed)
Patient had stool showing C. Diff and received 14 days of vancomycin as an out patient.  7 days later she had onset of watery diarrhea and called EMS who brought her to hospital.  Since then she has been on vancomycin and diarrhea has continued but she is only into her 3rd day and sometimes it can take 4-5 days before progress is seen.  I plan to do a flex sig tomorrow to see what the colon mucosa is like.  Comprehensive stool panel negative for anything else.  Discussed with patient who agrees with plan.

## 2016-08-30 NOTE — Progress Notes (Signed)
ANTICOAGULATION CONSULT NOTE - Follow up [place] for Warfarin dsoing Indication: atrial fibrillation  Allergies  Allergen Reactions  . Amoxicillin   . Cefdinir Diarrhea  . Cilostazol     Other reaction(s): Headache  . Levofloxacin   . Plavix [Clopidogrel Bisulfate]     12/12/11 patient reports she has never taken Plavix before.   . Sulfur Nausea Only    Patient Measurements: Height: 5\' 2"  (157.5 cm) Weight: 120 lb 1.6 oz (54.5 kg) IBW/kg (Calculated) : 50.1   Vital Signs: Temp: 98.2 F (36.8 C) (10/31 0807) Temp Source: Oral (10/31 0807) BP: 130/52 (10/31 0807) Pulse Rate: 50 (10/31 0807)  Labs:  Recent Labs  08/28/16 1931 08/29/16 0456 08/30/16 0358  HGB 17.6* 16.0  --   HCT 50.0* 45.6  --   PLT 205 160  --   LABPROT  --  34.3* 32.4*  INR  --  3.30 3.07  CREATININE 0.77 0.76 0.71    Estimated Creatinine Clearance: 39.9 mL/min (by C-G formula based on SCr of 0.71 mg/dL).   Medical History: Past Medical History:  Diagnosis Date  . Atrial fibrillation ([place]) 07/20/2009   Qualifier: Diagnosis of  By: Caryl Comes, MD, Leonidas Romberg Mack Guise   . C. difficile colitis    feces transplant  . Complete heart block  s/p av ablation    with atrial fibrillation. Pt is s/p AV nodal ablation due to permanent and symptomatic atrial fibrillation.   . Coronary artery disease    Nonobstructive  . Diverticulitis   . GERD (gastroesophageal reflux disease)   . Hereditary hemochromatosis ([place])    Phlebotomized in the past, not getting phlebotomy now  . Hyperlipidemia   . Hypertension   . Non Hodgkin's lymphoma ([place])    stable; followed by an oncologist  . Osteoarthritis   . 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 Pacemaker-dual-chamber-Medtronic 04/04/2011    Medications:  PTA: Warfarin 3 mg on Mondays and 2 mg on all other days - spoke with pt on phone 10/31 and confirmed dosing (matches chart notes)  Assessment: Patient admitted for recurrent C. Diff. She was taking  Warfarin for Afib prior to admission. Pharmacy is consulted to manage Warfarin.  10/30 INR 3.30 - hold warfarin  10/31 INR 3.07  Goal of Therapy:  INR 2-3   Plan:  INR trending down but still borderline supratherapeutic. Pt still with lots of diarrhea and did not eat breakfast today. Will hold today's dose of warfarin to ensure INR within goal range before redosing and recheck daily INR values.  Pharmacy will continue to follow.   Rayna Sexton, PharmD, BCPS Clinical Pharmacist 08/30/2016 10:19 AM

## 2016-08-30 NOTE — Consult Note (Signed)
    Consulted to review pacer settings in the 50's to 60's bpm. Upon review of the patient's last device interrogation it appears this is consistent with her device settings given prior history of a tachypalpitations sensation with higher device settings. Device appears to be functioning appropriately for her settings. Patient is asymptomatic from a cardiac standpoint. No further intervention needed from a cardiac standpoint at this time. Discussed with Dr. Saunders Revel, MD.

## 2016-08-31 ENCOUNTER — Encounter: Payer: Self-pay | Admitting: Anesthesiology

## 2016-08-31 ENCOUNTER — Encounter
Admission: EM | Disposition: A | Discharge status: Home / Self Care | Payer: Self-pay | Source: Home / Self Care | Attending: Internal Medicine

## 2016-08-31 LAB — CBC
HEMATOCRIT: 43 % (ref 35.0–47.0)
HEMOGLOBIN: 14.6 g/dL (ref 12.0–16.0)
MCH: 31.4 pg (ref 26.0–34.0)
MCHC: 34 g/dL (ref 32.0–36.0)
MCV: 92.5 fL (ref 80.0–100.0)
Platelets: 143 10*3/uL — ABNORMAL LOW (ref 150–440)
RBC: 4.65 MIL/uL (ref 3.80–5.20)
RDW: 14 % (ref 11.5–14.5)
WBC: 8.3 10*3/uL (ref 3.6–11.0)

## 2016-08-31 LAB — PROTIME-INR
INR: 2.41
Prothrombin Time: 26.7 seconds — ABNORMAL HIGH (ref 11.4–15.2)

## 2016-08-31 SURGERY — SIGMOIDOSCOPY, FLEXIBLE
Anesthesia: Moderate Sedation

## 2016-08-31 MED ORDER — WARFARIN SODIUM 1 MG PO TABS
3.0000 mg | ORAL_TABLET | Freq: Once | ORAL | Status: AC
  Administered 2016-08-31: 3 mg via ORAL
  Filled 2016-08-31: qty 3

## 2016-08-31 MED ORDER — CHOLESTYRAMINE LIGHT 4 G PO PACK
4.0000 g | PACK | Freq: Two times a day (BID) | ORAL | Status: DC
  Administered 2016-08-31: 4 g via ORAL
  Filled 2016-08-31: qty 1

## 2016-08-31 MED ORDER — SODIUM CHLORIDE 0.9 % IV SOLN
INTRAVENOUS | Status: DC
Start: 2016-08-31 — End: 2016-08-31
  Administered 2016-08-31: 1000 mL via INTRAVENOUS

## 2016-08-31 NOTE — Consult Note (Signed)
Patient diarrhea better, WBC down to normal, I think she does have C. Diff and is turning the corner and improving and due to anticoagulation and the improvement I recommend cancel the flex, continue her vancomycin, I think the stool was a false negative possibly due to medication given before collection.  I will see in office next week.   Likely will arrange stool transplant.

## 2016-08-31 NOTE — Progress Notes (Signed)
Patient back from endo lab, no procedure done today. VS wnl, pt able to ambulate independently to the bathroom. No distress.

## 2016-08-31 NOTE — Progress Notes (Signed)
Patient picked up by orderly and taken to endoscopy lab, report called and given to [facility] in endo. VS wnl at time of transfer. Family at bedside during transfer.

## 2016-08-31 NOTE — Progress Notes (Addendum)
ANTICOAGULATION CONSULT NOTE - Follow up [place] for Warfarin dsoing Indication: atrial fibrillation  Allergies  Allergen Reactions  . Amoxicillin   . Cefdinir Diarrhea  . Cilostazol     Other reaction(s): Headache  . Levofloxacin   . Plavix [Clopidogrel Bisulfate]     12/12/11 patient reports she has never taken Plavix before.   . Sulfur Nausea Only    Patient Measurements: Height: 5\' 2"  (157.5 cm) Weight: 120 lb 1.6 oz (54.5 kg) IBW/kg (Calculated) : 50.1   Vital Signs: Temp: 98.4 F (36.9 C) (11/01 1231) Temp Source: Oral (11/01 1231) BP: 127/49 (11/01 1231) Pulse Rate: 60 (11/01 1231)  Labs:  Recent Labs  08/28/16 1931 08/29/16 0456 08/30/16 0358 08/31/16 0502  HGB 17.6* 16.0  --  14.6  HCT 50.0* 45.6  --  43.0  PLT 205 160  --  143*  LABPROT  --  34.3* 32.4* 26.7*  INR  --  3.30 3.07 2.41  CREATININE 0.77 0.76 0.71  --     Estimated Creatinine Clearance: 39.9 mL/min (by C-G formula based on SCr of 0.71 mg/dL).   Medical History: Past Medical History:  Diagnosis Date  . Atrial fibrillation ([place]) 07/20/2009   Qualifier: Diagnosis of  By: Caryl Comes, MD, Leonidas Romberg Mack Guise   . C. difficile colitis    feces transplant  . Complete heart block  s/p av ablation    with atrial fibrillation. Pt is s/p AV nodal ablation due to permanent and symptomatic atrial fibrillation.   . Coronary artery disease    Nonobstructive  . Diverticulitis   . GERD (gastroesophageal reflux disease)   . Hereditary hemochromatosis ([place])    Phlebotomized in the past, not getting phlebotomy now  . Hyperlipidemia   . Hypertension   . Non Hodgkin's lymphoma ([place])    stable; followed by an oncologist  . Osteoarthritis   . 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 Pacemaker-dual-chamber-Medtronic 04/04/2011    Medications:  PTA: Warfarin 3 mg on Mondays and 2 mg on all other days - spoke with pt on phone 10/31 and confirmed dosing (matches chart notes)  Assessment: Patient  admitted for recurrent C. Diff. She was taking Warfarin for Afib prior to admission. Pharmacy is consulted to manage Warfarin.  10/30 INR 3.30 - hold warfarin  10/31 INR 3.07 - hold warfarin 11/1   INR 2.41  Goal of Therapy:  INR 2-3   Plan:  INR trending down now in goal range. Due to holding warfarin for a few days, will order warfarin 3 mg PO x1 for tonight.   Recheck daily INR values. No s/sx of bleeding noted per RN today. Procedure canceled for today. Discussed with Dr. Ether Griffins during rounds, ok to continue dosing warfarin today.   Pharmacy will continue to follow.   Rayna Sexton, PharmD, BCPS Clinical Pharmacist 08/31/2016 2:06 PM

## 2016-08-31 NOTE — Progress Notes (Signed)
[facility] [place] NAME: [name]    MR#:  [id number]  DATE OF BIRTH:  1928/06/11  SUBJECTIVE:  CHIEF COMPLAINT:   Chief Complaint  Patient presents with  . GI Problem    C Diff   The patient is 82 year old Caucasian female with past medical history significant for history of C. difficile colitis 3 years ago, status post stool transplant, who presents to the hospital with complaints of diarrhea. Apparently patient completed 14 day course of oral vancomycin. A week ago, however, on the day of admission. She was checked. They had just dizziness and had at least 12 loose bowel movements and abdominal pain on the left side of her abdomen. On arrival to the emergency room she was noted to have hyponatremia and elevated white blood cell count and was admitted to the hospital. She continues to have diarrheal stool and discomfort in her upper abdomen, some nausea, But able to eat some full liquid diet. C. difficile studies came back negative. Gastrointestinal panel Was negative. Patient is on vancomycin orally. White cell count normalized. Gastroenterologist was planning flexible sigmoidoscopy today, however, since patient's Coumadin level was too high, procedure is postponed, recommended to continue vancomycin orally for suspected C. difficile infection. Patient's diarrhea has improved.     Review of Systems  Gastrointestinal: Negative for abdominal pain, diarrhea and nausea.    VITAL SIGNS: Blood pressure (!) 127/49, pulse 60, temperature 98.4 F (36.9 C), temperature source Oral, resp. rate 14, height 5\' 2"  (1.575 m), weight 54.5 kg (120 lb 1.6 oz), SpO2 100 %.  PHYSICAL EXAMINATION:   GENERAL:  82 y.o.-year-old patient lying in the bed in milddistress due to  diarrhea, but less comfortable, more hydrated appearance  EYES: Pupils equal, round, reactive to light and accommodation. No scleral icterus. Extraocular muscles intact.  HEENT:  Head atraumatic, normocephalic. Oropharynx and nasopharynx clear.  NECK:  Supple, no jugular venous distention. No thyroid enlargement, no tenderness.  LUNGS: Normal breath sounds bilaterally, no wheezing, rales,rhonchi or crepitation. No use of accessory muscles of respiration.  CARDIOVASCULAR: S1, S2 normal. No murmurs, rubs, or gallops.  ABDOMEN: Soft, no pain on palpation, no rebound or guarding, nondistended. Bowel sounds present. No organomegaly or mass.  EXTREMITIES: No pedal edema, cyanosis, or clubbing.  NEUROLOGIC: Cranial nerves II through XII are intact. Muscle strength 5/5 in all extremities. Sensation intact. Gait not checked.  PSYCHIATRIC: The patient is alert and oriented x 3.  SKIN: No obvious rash, lesion, or ulcer.   ORDERS/RESULTS REVIEWED:   CBC  Recent Labs Lab 08/28/16 1931 08/29/16 0456 08/31/16 0502  WBC 16.8* 15.5* 8.3  HGB 17.6* 16.0 14.6  HCT 50.0* 45.6 43.0  PLT 205 160 143*  MCV 100.8* 91.6 92.5  MCH 39.5* 32.1 31.4  MCHC 35.2 35.0 34.0  RDW 14.0 13.8 14.0   ------------------------------------------------------------------------------------------------------------------  Chemistries   Recent Labs Lab 08/28/16 1931 08/29/16 0456 08/30/16 0358  NA 128* 133* 136  K 4.4 3.4* 3.9  CL 95* 106 112*  CO2 21* 19* 21*  GLUCOSE 136* 148* 113*  BUN 9 9 6   CREATININE 0.77 0.76 0.71  CALCIUM 9.0 8.3* 8.3*  MG  --  1.9  --   AST 53*  --  26  ALT 32  --  22  ALKPHOS 109  --  73  BILITOT 2.2*  --  0.9   ------------------------------------------------------------------------------------------------------------------ estimated creatinine clearance is 39.9 mL/min (by C-G formula based on SCr of  0.71 mg/dL). ------------------------------------------------------------------------------------------------------------------  Recent Labs  08/29/16 0456  TSH 1.985    Cardiac Enzymes No results for input(s): CKMB, TROPONINI, MYOGLOBIN in the last 168  hours.  Invalid input(s): CK ------------------------------------------------------------------------------------------------------------------ Invalid input(s): POCBNP ---------------------------------------------------------------------------------------------------------------  RADIOLOGY: US Abdomen Limited Ruq  Result Date: 08/30/2016 CLINICAL DATA:  Right upper quadrant pain for the past several months EXAM: US ABDOMEN LIMITED - RIGHT UPPER QUADRANT COMPARISON:  Abdominal and pelvic CT scan of June 05, 2014 FINDINGS: Gallbladder: The gallbladder is surgically absent. Common bile duct: Diameter: 2.2 mm Liver: The hepatic echotexture is normal. There is mild intrahepatic ductal dilation. There is a cystic appearing structure observed in the pancreatic head which has been previously demonstrated. It measures 2.3 x 1.7 x 2.5 cm. IMPRESSION: Mild intrahepatic ductal dilation. Normal caliber common bile duct. Cystic appearing focus in the pancreatic head which appears to have increased since the previous CT scan. Pancreatic protocol MRI is recommended. Electronically Signed   By: David  Martinique M.D.   On: 08/30/2016 09:36    EKG:  Orders placed or performed in visit on 03/08/16  . EKG 12-Lead    ASSESSMENT AND PLAN:  Active Problems:   Recurrent colitis due to Clostridium difficile  #1. Recurrent suspected C. Difficile Diarrhea, stool cultures were negative for C. Difficile, however, patient improved on vancomycin orally, including white blood cell count, appreciate Dr. Percell Boston input, commended to continue vancomycin orally to complete 14 day therapy, patient is to follow-up with Dr. Vira Agar as outpatient for further recommendations, stool transplant if needed.  #2. Hypotension, resolved on IV fluids #3. Bradycardia, TSH was normal at 1.98, heart rate remained stable in 50s to 60s, patient is not on beta blockers or calcium channel blockers at home or in the hospital. Pacemaker generator  was changed in 2014. Discussed case with [place] cardiology, apparently patient's pacemaker will set rate was decreased to 50s due to perceived palpitations by patient. Heart rate remained stable in 50s to 60s #4. Hyponatremia, resolved with therapy #5. Hypokalemia, resolved, supplemented intravenously, magnesium level was found to be 1.9, marginal #6. Leukocytosis,result with vancomycin therapy #7. Dehydration with hemoconcentration, resolved with IV fluid administration, follow closely #8. Coagulopathy with INR of 3.3, holding Coumadin for likely an endoscopic procedure tomorrow #9 RUQ pain, [place] of right upper quadrant revealed mild intrahepatic ductal dilatation, normal common bile duct, cystic focus in pancreatic head, which may have increased since prior CT scan, MRCP could not get done due to pacemaker, LFT's , lipase have improved, may benefit from repeating CT scan of the abdomen, endoscopic ultrasound, discussed with gastroenterologist, Dr. Vira Agar is going to follow up with patient and refer to endoscopic ultrasound as outpatient  Management plans discussed with the patient, family and they are in agreement.   DRUG ALLERGIES:  Allergies  Allergen Reactions  . Amoxicillin   . Cefdinir Diarrhea  . Cilostazol     Other reaction(s): Headache  . Levofloxacin   . Plavix [Clopidogrel Bisulfate]     12/12/11 patient reports she has never taken Plavix before.   . Sulfur Nausea Only    CODE STATUS:     Code Status Orders        Start     Ordered   08/28/16 2136  Do not attempt resuscitation (DNR)  Continuous    Question Answer Comment  In the event of cardiac or respiratory ARREST Do not call a "code blue"   In the event of cardiac or respiratory ARREST Do not perform Intubation, CPR,  defibrillation or ACLS   In the event of cardiac or respiratory ARREST Use medication by any route, position, wound care, and other measures to relive pain and suffering. May use oxygen, suction and  manual treatment of airway obstruction as needed for comfort.   Comments nurse may pronounce      08/28/16 2136    Code Status History    Date Active Date Inactive Code Status Order ID Comments User Context   This patient has a current code status but no historical code status.    Advance Directive Documentation   Flowsheet Row Most Recent Value  Type of Advance Directive  Healthcare Power of Attorney, Living will  Pre-existing out of facility DNR order (yellow form or pink MOST form)  No data  "MOST" Form in Place?  No data      TOTAL TIME TAKING CARE OF THIS PATIENT: 35 minutes.  Discussed with Dr. Vira Agar today, again  [facility] M.D on 08/31/2016 at 3:21 PM  Between 7am to 6pm - Pager - [phone]  After 6pm go to [email or web address] - password [email or web address] [facility]  [facility]  Office  [phone]  CC: Primary care physician; Adin Hector, MD

## 2016-08-31 NOTE — Progress Notes (Signed)
[place] making rounds visited with the Pt. Pt told [place] she had outlived all her ancestors living along [facility] and was able to see her grandchildren and great grandchildren, of which she was thankful. Pt told [place], she has a procedure scheduled for tomorrow and asked [place] to pray for the procedure to go well. [place] prayed for the procedure to go well.     08/31/16 1500  Clinical Encounter Type  Visited With Patient  Visit Type Initial;Spiritual support  Referral From Nurse  Spiritual Encounters  Spiritual Needs Prayer  Stress Factors  Patient Stress Factors Exhausted;Other (Comment)  Family Stress Factors Other (Comment)

## 2016-09-01 DIAGNOSIS — E876 Hypokalemia: Secondary | ICD-10-CM

## 2016-09-01 DIAGNOSIS — K862 Cyst of pancreas: Secondary | ICD-10-CM

## 2016-09-01 DIAGNOSIS — E871 Hypo-osmolality and hyponatremia: Secondary | ICD-10-CM

## 2016-09-01 DIAGNOSIS — D72829 Elevated white blood cell count, unspecified: Secondary | ICD-10-CM

## 2016-09-01 DIAGNOSIS — E860 Dehydration: Secondary | ICD-10-CM

## 2016-09-01 DIAGNOSIS — D689 Coagulation defect, unspecified: Secondary | ICD-10-CM

## 2016-09-01 DIAGNOSIS — H8109 Meniere's disease, unspecified ear: Secondary | ICD-10-CM

## 2016-09-01 DIAGNOSIS — R001 Bradycardia, unspecified: Secondary | ICD-10-CM

## 2016-09-01 DIAGNOSIS — I959 Hypotension, unspecified: Secondary | ICD-10-CM

## 2016-09-01 LAB — PROTIME-INR
INR: 2.47
PROTHROMBIN TIME: 27.2 s — AB (ref 11.4–15.2)

## 2016-09-01 MED ORDER — MECLIZINE HCL 25 MG PO TABS: 25.0000 mg | ORAL_TABLET | Freq: Three times a day (TID) | ORAL | 0 refills | Status: DC | PRN

## 2016-09-01 MED ORDER — VANCOMYCIN 50 MG/ML ORAL SOLUTION: 250.0000 mg | Freq: Four times a day (QID) | ORAL | 0 refills | Status: AC

## 2016-09-01 MED ORDER — WARFARIN SODIUM 1 MG PO TABS: 2.0000 mg | ORAL_TABLET | Freq: Once | ORAL | Status: DC

## 2016-09-01 NOTE — Care Management Note (Signed)
Case Management Note  Patient Details  Name: [name] MRN: [id number] Date of Birth: 06/01/1928  Subjective/Objective:  Spoke with patient to discussed home health. She would benefit from SN and PT. Patient has no agency choice. [facility]erral to [facility].  Patient lives alone. She is normally independent with adls. No DME. No home O2.  PCP is [facility].                  Action/Plan: Referral to [facility] for [facility] and PT.   Expected Discharge Date:  09/01/2016               Expected Discharge Plan:  [place]  In-House Referral:     Discharge planning Services  CM Consult  Post Acute Care Choice:  Home Health Choice offered to:  Patient  DME Arranged:    DME Agency:     HH Arranged:  RN, PT [place] Agency:  [place]  Status of Service:  Completed, signed off  If discussed at [place] of Stay Meetings, dates discussed:    Additional Comments:  Jolly Mango, RN 09/01/2016, 9:21 AM

## 2016-09-01 NOTE — Discharge Summary (Signed)
[facility] NAME: [name]    MR#:  [id number]  DATE OF BIRTH:  [birth year]  DATE OF ADMISSION:  08/28/2016 ADMITTING PHYSICIAN: Loletha Grayer, MD  DATE OF DISCHARGE: 09/01/2016  1:50 PM  PRIMARY CARE PHYSICIAN: Tama High III, MD     ADMISSION DIAGNOSIS:  Hyponatremia [E87.1] C. difficile diarrhea [A04.72]  DISCHARGE DIAGNOSIS:  Principal Problem:   Recurrent colitis due to Clostridium difficile Active Problems:   Hyponatremia   Hypokalemia   Leukocytosis   Dehydration   Pancreatic cyst   Hypotension   Bradycardia   Meniere disease   Coagulopathy ([place])   SECONDARY DIAGNOSIS:   Past Medical History:  Diagnosis Date  . Atrial fibrillation ([place]) 07/20/2009   Qualifier: Diagnosis of  By: Caryl Comes, MD, Leonidas Romberg Mack Guise   . C. difficile colitis    feces transplant  . Complete heart block  s/p av ablation    with atrial fibrillation. Pt is s/p AV nodal ablation due to permanent and symptomatic atrial fibrillation.   . Coronary artery disease    Nonobstructive  . Diverticulitis   . GERD (gastroesophageal reflux disease)   . Hereditary hemochromatosis ([place])    Phlebotomized in the past, not getting phlebotomy now  . Hyperlipidemia   . Hypertension   . Non Hodgkin's lymphoma ([place])    stable; followed by an oncologist  . Osteoarthritis   . 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 Pacemaker-dual-chamber-[employer]tr[employer] 04/04/2011    .pro HOSPITAL COURSE:  The patient is 82 year old Caucasian female with past medical history significant for history of C. difficile colitis 3 years ago, status post stool transplant, who presented to the hospital with complaints of diarrhea. Apparently patient completed 14 day course of oral vancomycin. A week ago, however, on the day of admission She had dizziness and at least 12 loose bowel movements and abdominal pain on the left side of her abdomen. On arrival to the emergency room she  was noted to have hyponatremia and elevated white blood cell count and was admitted to the hospital. Was initiated on vancomycin orally and improved clinically . She was rehydrated with IV fluids and her blood pressure has improved. She was seen by gastroenterologist, Dr. Vira Agar, who initially recommended to get flexible sigmoidoscopy, however, patient improved and he felt that patient should continue antibiotic therapy and follow up with him as outpatient for further recommendations. Since patient was having discomfort in epigastric area, she underwent a right upper quadrant ultrasound, which revealed worsening pancreatic cyst. MRCP was suggested, however, patient could not get through MRI due to pacemaker, endoscopic ultrasound was recommended by gastroenterologist as outpatient and will be set up in the future. On the day of discharge, patient was able to eat a soft diet and had no significant discomfort, nausea, vomiting, diarrhea. Of note, patient's stool cultures did not show C. difficile infection, however, patient's white blood cell count normalized with vancomycin, she clinically improved, so she was felt to have C. difficile related infection by gastroenterologist. She was evaluated by physical therapist and recommended no PT follow-up. Discussion by problem:  #1. Recurrent suspected C. Difficile Diarrhea, stool cultures were negative for C. Difficile, however, patient improved on vancomycin clinically, including white blood cell count, she was seen by gastroenterologist  Dr. Percell Boston who recommended to continue vancomycin orally to complete 14 day therapy, patient is to follow-up with Dr. Vira Agar as outpatient for further recommendations, stool transplant if needed. Of note, patient was distraught about  vancomycin price and was hopeful that Dr. Vira Agar would take some steps to ensure that this recurrence of C. difficile diarrhea is her last.  #2. Hypotension, resolved on IV fluids #3. Bradycardia,  TSH was normal at 1.98, heart rate remained stable in 50s to 60s, patient was not on beta blockers or calcium channel blockers at home or in the hospital. Pacemaker generator was changed in 2014. Discussed case with [facility] cardiology, apparently patient's pacemaker set rate was decreased to 50s due to perceived palpitations by patient. Heart rate remained stable in 50s to 60s on the day of discharge, patient was asymptomatic.  #4. Hyponatremia, resolved with therapy #5. Hypokalemia, resolved, supplemented intravenously, magnesium level was found to be 1.9, marginal #6. Leukocytosis, improved with vancomycin therapy #7. Dehydration with hemoconcentration, resolved with IV fluid administration. #8. Coagulopathy with INR of 3.3, Coumadin was placed on hold initially, resumed by the day of discharge, pro time was 27.2, INR was 2.47 on the day of discharge #9 RUQ pain, [place] of right upper quadrant revealed mild intrahepatic ductal dilatation, normal common bile duct, cystic focus in pancreatic head, which may have increased since prior CT scan, MRCP could not get done due to pacemaker, LFT's , lipase have improved, the patient may benefit from repeating CT scan of the abdomen as outpatient and ultimately undergo repeated endoscopic ultrasound, if needed, discussed with gastroenterologist, Dr. Vira Agar, who is going to follow up with patient as outpatient DISCHARGE CONDITIONS:   Stable  CONSULTS OBTAINED:  Treatment Team:  Manya Silvas, MD  DRUG ALLERGIES:   Allergies  Allergen Reactions  . Amoxicillin   . Cefdinir Diarrhea  . Cilostazol     Other reaction(s): Headache  . Levofloxacin   . Plavix [Clopidogrel Bisulfate]     12/12/11 patient reports she has never taken Plavix before.   . Sulfur Nausea Only    DISCHARGE MEDICATIONS:   Discharge Medication List as of 09/01/2016  9:51 AM    START taking these medications   Details  meclizine (ANTIVERT) 25 MG tablet Take 1 tablet (25 mg total)  by mouth 3 (three) times daily as needed., Starting Thu 09/01/2016, Normal    vancomycin (VANCOCIN) 50 mg/mL oral solution Take 5 mLs (250 mg total) by mouth every 6 (six) hours., Starting Thu 09/01/2016, Normal      CONTINUE these medications which have NOT CHANGED   Details  ALPRAZolam (XANAX) 0.25 MG tablet Take 0.5 mg by mouth at bedtime as needed for sleep., Until Discontinued, Historical Med    amLODipine (NORVASC) 5 MG tablet TAKE ONE TABLET BY MOUTH ONCE DAILY, Historical Med    Cetirizine HCl (ZYRTEC ALLERGY PO) Take by mouth daily. , Until Discontinued, Historical Med    fluticasone (FLONASE) 50 MCG/ACT nasal spray Place 2 sprays into the nose daily as needed for allergies. , Until Discontinued, Historical Med    folic acid (FOLVITE) 1 MG tablet Take 1 mg by mouth daily.  , Until Discontinued, Historical Med    !! gabapentin (NEURONTIN) 100 MG capsule Take 100 mg by mouth. Takes 1 tablet in the am daily., Historical Med    !! gabapentin (NEURONTIN) 400 MG capsule Take 400 mg by mouth at bedtime. , Until Discontinued, Historical Med    polyvinyl alcohol (LIQUIFILM TEARS) 1.4 % ophthalmic solution Place 2 drops into both eyes 3 (three) times daily as needed (for dry eyes)., Until Discontinued, Historical Med    !! warfarin (COUMADIN) 2 MG tablet Take 1 tablet (2 mg total)  by mouth daily. As directed, Starting Wed 05/04/2016, Normal    !! warfarin (COUMADIN) 3 MG tablet Take as directed by coumadin clinic, Normal     !! - Potential duplicate medications found. Please discuss with provider.       DISCHARGE INSTRUCTIONS:    The patient is to follow-up with primary care physician, primary gastroenterologist, Dr. Vira Agar as outpatient  If you experience worsening of your admission symptoms, develop shortness of breath, life threatening emergency, suicidal or homicidal thoughts you must seek medical attention immediately by calling 911 or calling your MD immediately  if symptoms  less severe.  You Must read complete instructions/literature along with all the possible adverse reactions/side effects for all the Medicines you take and that have been prescribed to you. Take any new Medicines after you have completely understood and accept all the possible adverse reactions/side effects.   Please note  You were cared for by a hospitalist during your hospital stay. If you have any questions about your discharge medications or the care you received while you were in the hospital after you are discharged, you can call the unit and asked to speak with the hospitalist on call if the hospitalist that took care of you is not available. Once you are discharged, your primary care physician will handle any further medical issues. Please note that NO REFILLS for any discharge medications will be authorized once you are discharged, as it is imperative that you return to your primary care physician (or establish a relationship with a primary care physician if you do not have one) for your aftercare needs so that they can reassess your need for medications and monitor your lab values.    Today   CHIEF COMPLAINT:   Chief Complaint  Patient presents with  . GI Problem    C Diff    HISTORY OF PRESENT ILLNESS:  [name]  is a 83 y.o. female with a known history of C. difficile colitis 3 years ago, status post stool transplant, who presented to the hospital with complaints of diarrhea. Apparently patient completed 14 day course of oral vancomycin. A week ago, however, on the day of admission She had dizziness and at least 12 loose bowel movements and abdominal pain on the left side of her abdomen. On arrival to the emergency room she was noted to have hyponatremia and elevated white blood cell count and was admitted to the hospital. Was initiated on vancomycin orally and improved clinically . She was rehydrated with IV fluids and her blood pressure has improved. She was seen by  gastroenterologist, Dr. Vira Agar, who initially recommended to get flexible sigmoidoscopy, however, patient improved and he felt that patient should continue antibiotic therapy and follow up with him as outpatient for further recommendations. Since patient was having discomfort in epigastric area, she underwent a right upper quadrant ultrasound, which revealed worsening pancreatic cyst. MRCP was suggested, however, patient could not get through MRI due to pacemaker, endoscopic ultrasound was recommended by gastroenterologist as outpatient and will be set up in the future. On the day of discharge, patient was able to eat a soft diet and had no significant discomfort, nausea, vomiting, diarrhea. Of note, patient's stool cultures did not show C. difficile infection, however, patient's white blood cell count normalized with vancomycin, she clinically improved, so she was felt to have C. difficile related infection by gastroenterologist. She was evaluated by physical therapist and recommended no PT follow-up. Discussion by problem:  #1. Recurrent suspected C. Difficile Diarrhea,  stool cultures were negative for C. Difficile, however, patient improved on vancomycin clinically, including white blood cell count, she was seen by gastroenterologist  Dr. Percell Boston who recommended to continue vancomycin orally to complete 14 day therapy, patient is to follow-up with Dr. Vira Agar as outpatient for further recommendations, stool transplant if needed. Of note, patient was distraught about vancomycin price and was hopeful that Dr. Vira Agar would take some steps to ensure that this recurrence of C. difficile diarrhea is her last.  #2. Hypotension, resolved on IV fluids #3. Bradycardia, TSH was normal at 1.98, heart rate remained stable in 50s to 60s, patient was not on beta blockers or calcium channel blockers at home or in the hospital. Pacemaker generator was changed in 2014. Discussed case with [facility] cardiology, apparently  patient's pacemaker set rate was decreased to 50s due to perceived palpitations by patient. Heart rate remained stable in 50s to 60s on the day of discharge, patient was asymptomatic.  #4. Hyponatremia, resolved with therapy #5. Hypokalemia, resolved, supplemented intravenously, magnesium level was found to be 1.9, marginal #6. Leukocytosis, improved with vancomycin therapy #7. Dehydration with hemoconcentration, resolved with IV fluid administration. #8. Coagulopathy with INR of 3.3, Coumadin was placed on hold initially, resumed by the day of discharge, pro time was 27.2, INR was 2.47 on the day of discharge #9 RUQ pain, [place] of right upper quadrant revealed mild intrahepatic ductal dilatation, normal common bile duct, cystic focus in pancreatic head, which may have increased since prior CT scan, MRCP could not get done due to pacemaker, LFT's , lipase have improved, the patient may benefit from repeating CT scan of the abdomen as outpatient and ultimately undergo repeated endoscopic ultrasound, if needed, discussed with gastroenterologist, Dr. Vira Agar, who is going to follow up with patient as outpatient    VITAL SIGNS:  Blood pressure (!) 125/59, pulse 61, temperature 98 F (36.7 C), temperature source Oral, resp. rate 19, height 5\' 2"  (1.575 m), weight 54.5 kg (120 lb 1.6 oz), SpO2 99 %.  I/O:   Intake/Output Summary (Last 24 hours) at 09/01/16 1451 Last data filed at 09/01/16 0800  Gross per 24 hour  Intake             2855 ml  Output                0 ml  Net             2855 ml    PHYSICAL EXAMINATION:  GENERAL:  82 y.o.-year-old patient lying in the bed with no acute distress.  EYES: Pupils equal, round, reactive to light and accommodation. No scleral icterus. Extraocular muscles intact.  HEENT: Head atraumatic, normocephalic. Oropharynx and nasopharynx clear.  NECK:  Supple, no jugular venous distention. No thyroid enlargement, no tenderness.  LUNGS: Normal breath sounds  bilaterally, no wheezing, rales,rhonchi or crepitation. No use of accessory muscles of respiration.  CARDIOVASCULAR: S1, S2 normal. No murmurs, rubs, or gallops.  ABDOMEN: Soft, non-tender, non-distended. Bowel sounds present. No organomegaly or mass.  EXTREMITIES: No pedal edema, cyanosis, or clubbing.  NEUROLOGIC: Cranial nerves II through XII are intact. Muscle strength 5/5 in all extremities. Sensation intact. Gait not checked.  PSYCHIATRIC: The patient is alert and oriented x 3.  SKIN: No obvious rash, lesion, or ulcer.   DATA REVIEW:   CBC  Recent Labs Lab 08/31/16 0502  WBC 8.3  HGB 14.6  HCT 43.0  PLT 143*    Chemistries   Recent Labs Lab 08/29/16 0456 08/30/16  0358  NA 133* 136  K 3.4* 3.9  CL 106 112*  CO2 19* 21*  GLUCOSE 148* 113*  BUN 9 6  CREATININE 0.76 0.71  CALCIUM 8.3* 8.3*  MG 1.9  --   AST  --  26  ALT  --  22  ALKPHOS  --  73  BILITOT  --  0.9    Cardiac Enzymes No results for input(s): TROPONINI in the last 168 hours.  Microbiology Results  Results for orders placed or performed during the hospital encounter of 08/28/16  C difficile quick scan w PCR reflex     Status: None   Collection Time: 08/29/16  7:56 AM  Result Value Ref Range Status   C Diff antigen NEGATIVE NEGATIVE Final   C Diff toxin NEGATIVE NEGATIVE Final   C Diff interpretation No C. difficile detected.  Final  Gastrointestinal Panel by PCR , Stool     Status: None   Collection Time: 08/30/16  1:13 PM  Result Value Ref Range Status   Campylobacter species NOT DETECTED NOT DETECTED Final   Plesimonas shigelloides NOT DETECTED NOT DETECTED Final   Salmonella species NOT DETECTED NOT DETECTED Final   Yersinia enterocolitica NOT DETECTED NOT DETECTED Final   Vibrio species NOT DETECTED NOT DETECTED Final   Vibrio cholerae NOT DETECTED NOT DETECTED Final   Enteroaggregative E coli (EAEC) NOT DETECTED NOT DETECTED Final   Enteropathogenic E coli (EPEC) NOT DETECTED NOT  DETECTED Final   Enterotoxigenic E coli (ETEC) NOT DETECTED NOT DETECTED Final   Shiga like toxin producing E coli (STEC) NOT DETECTED NOT DETECTED Final   Shigella/Enteroinvasive E coli (EIEC) NOT DETECTED NOT DETECTED Final   Cryptosporidium NOT DETECTED NOT DETECTED Final   Cyclospora cayetanensis NOT DETECTED NOT DETECTED Final   Entamoeba histolytica NOT DETECTED NOT DETECTED Final   Giardia lamblia NOT DETECTED NOT DETECTED Final   Adenovirus F40/41 NOT DETECTED NOT DETECTED Final   Astrovirus NOT DETECTED NOT DETECTED Final   Norovirus GI/GII NOT DETECTED NOT DETECTED Final   Rotavirus A NOT DETECTED NOT DETECTED Final   Sapovirus (I, II, IV, and V) NOT DETECTED NOT DETECTED Final    RADIOLOGY:  No results found.  EKG:   Orders placed or performed in visit on 03/08/16  . EKG 12-Lead      Management plans discussed with the patient, family and they are in agreement.  CODE STATUS:     Code Status Orders        Start     Ordered   08/28/16 2136  Do not attempt resuscitation (DNR)  Continuous    Question Answer Comment  In the event of cardiac or respiratory ARREST Do not call a "code blue"   In the event of cardiac or respiratory ARREST Do not perform Intubation, CPR, defibrillation or ACLS   In the event of cardiac or respiratory ARREST Use medication by any route, position, wound care, and other measures to relive pain and suffering. May use oxygen, suction and manual treatment of airway obstruction as needed for comfort.   Comments nurse may pronounce      08/28/16 2136    Code Status History    Date Active Date Inactive Code Status Order ID Comments User Context   This patient has a current code status but no historical code status.    Advance Directive Documentation   Flowsheet Row Most Recent Value  Type of Advance Directive  Healthcare Power of [place], Living will  Pre-existing out of facility DNR order (yellow form or pink MOST form)  No data  "MOST"  Form in Place?  No data      TOTAL TIME TAKING CARE OF THIS PATIENT: 40 minutes.    Theodoro Grist M.D on 09/01/2016 at 2:51 PM  Between 7am to 6pm - Pager - [phone]  After 6pm go to [email or web address] - password [email or web address] [facility]  [facility]  Office  [phone]  CC: Primary care physician; Adin Hector, MD

## 2016-09-01 NOTE — Progress Notes (Signed)
ANTICOAGULATION CONSULT NOTE - Follow up [place] for Warfarin dsoing Indication: atrial fibrillation  Allergies  Allergen Reactions  . Amoxicillin   . Cefdinir Diarrhea  . Cilostazol     Other reaction(s): Headache  . Levofloxacin   . Plavix [Clopidogrel Bisulfate]     12/12/11 patient reports she has never taken Plavix before.   . Sulfur Nausea Only    Patient Measurements: Height: 5\' 2"  (157.5 cm) Weight: 120 lb 1.6 oz (54.5 kg) IBW/kg (Calculated) : 50.1   Vital Signs: Temp: 98 F (36.7 C) (11/02 0717) Temp Source: Oral (11/02 0717) BP: 119/53 (11/02 0717) Pulse Rate: 50 (11/02 0717)  Labs:  Recent Labs  08/30/16 0358 08/31/16 0502 09/01/16 0425  HGB  --  14.6  --   HCT  --  43.0  --   PLT  --  143*  --   LABPROT 32.4* 26.7* 27.2*  INR 3.07 2.41 2.47  CREATININE 0.71  --   --     Estimated Creatinine Clearance: 39.9 mL/min (by C-G formula based on SCr of 0.71 mg/dL).   Medical History: Past Medical History:  Diagnosis Date  . Atrial fibrillation ([place]) 07/20/2009   Qualifier: Diagnosis of  By: Caryl Comes, MD, Leonidas Romberg Mack Guise   . C. difficile colitis    feces transplant  . Complete heart block  s/p av ablation    with atrial fibrillation. Pt is s/p AV nodal ablation due to permanent and symptomatic atrial fibrillation.   . Coronary artery disease    Nonobstructive  . Diverticulitis   . GERD (gastroesophageal reflux disease)   . Hereditary hemochromatosis ([place])    Phlebotomized in the past, not getting phlebotomy now  . Hyperlipidemia   . Hypertension   . Non Hodgkin's lymphoma ([place])    stable; followed by an oncologist  . Osteoarthritis   . 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 Pacemaker-dual-chamber-[employer]tr[employer] 04/04/2011    Medications:  PTA: Warfarin 3 mg on Mondays and 2 mg on all other days - spoke with pt on phone 10/31 and confirmed dosing (matches chart notes)  Assessment: Patient admitted for recurrent C. Diff. She was taking  Warfarin for Afib prior to admission. Pharmacy is consulted to manage Warfarin.  10/30 INR 3.30 - hold warfarin  10/31 INR 3.07 - hold warfarin 11/1   INR 2.41 - 3 mg warfarin 11/2   INR 2.47  Goal of Therapy:  INR 2-3   Plan:  INR in goal range. Will continue home dosing - warfarin 2 mg PO x1 for tonight.   Recheck daily INR values.   Pharmacy will continue to follow.   Rayna Sexton, PharmD, BCPS Clinical Pharmacist 09/01/2016 11:17 AM

## 2016-09-01 NOTE — Evaluation (Signed)
Physical Therapy Evaluation Patient Details Name: BULAR HIRAOKA MRN: [id number] DOB: February 14, 1928 Today's Date: 09/01/2016   History of Present Illness  Lorra Downing  is a 82 y.o. female patient with recent diagnosis of C. difficile as outpatient. She states she completed 14 day course of oral vancomycin last Saturday. Today she was so sick and developed chills dizziness and had 12 loose bowel movements with abdominal pain on her left side. She has a history of C. difficile in the past back in 2013 with fecal transplant at that time. In the ER, she was found to have a low sodium and elevated white count. Pt has been treated for presumed C. diff infection during admission despite repeat negative cultures  Clinical Impression  Pt demonstrates excellent stability and strength with mobility. She is independent for transfers and ambulates around RN station with supervision only. She does not require an assistive device and demonstrates good stability with ambulation. BERG score is 53/56 which is good for her age. She only has higher level deficits in balance such as single leg stance but this is baseline for patient. Five time sit to stand test is 11.0 seconds which demonstrates good LE strength and stability with transfers. Pt has no further PT needs following discharge and while admitted. She has returned to her baseline level of function. Will complete order. No further needs.     Follow Up Recommendations No PT follow up    Equipment Recommendations  None recommended by PT    Recommendations for Other Services       Precautions / Restrictions Precautions Precautions: None Restrictions Weight Bearing Restrictions: No      Mobility  Bed Mobility               General bed mobility comments: Recieved upright in recliner  Transfers Overall transfer level: Independent Equipment used: None             General transfer comment: Good speed/sequencing without assistive device. Good  stability once uprigth in standing  Ambulation/Gait Ambulation/Gait assistance: Supervision Ambulation Distance (Feet): 220 Feet Assistive device: None Gait Pattern/deviations: WFL(Within Functional Limits) Gait velocity: WFL Gait velocity interpretation: >2.62 ft/sec, indicative of independent community ambulator General Gait Details: Pt demonstrates good stability with ambulation without assistive device. Able to perform head turns horizontally for conversation without lateral veering  Stairs            Wheelchair Mobility    Modified Rankin (Stroke Patients Only)       Balance Overall balance assessment: Independent                               Standardized Balance Assessment Standardized Balance Assessment : Berg Balance Test Berg Balance Test Sit to Stand: Able to stand without using hands and stabilize independently Standing Unsupported: Able to stand safely 2 minutes Sitting with Back Unsupported but Feet Supported on Floor or Stool: Able to sit safely and securely 2 minutes Stand to Sit: Sits safely with minimal use of hands Transfers: Able to transfer safely, minor use of hands Standing Unsupported with Eyes Closed: Able to stand 10 seconds safely Standing Ubsupported with Feet Together: Able to place feet together independently and stand 1 minute safely From Standing, Reach Forward with Outstretched Arm: Can reach forward >12 cm safely (5") From Standing Position, Pick up Object from Floor: Able to pick up shoe safely and easily From Standing Position, Turn to Look Behind Over  each Shoulder: Looks behind from both sides and weight shifts well Turn 360 Degrees: Able to turn 360 degrees safely in 4 seconds or less Standing Unsupported, Alternately Place Feet on Step/Stool: Able to stand independently and safely and complete 8 steps in 20 seconds Standing Unsupported, One Foot in Front: Able to plae foot ahead of the other independently and hold 30  seconds Standing on One Leg: Able to lift leg independently and hold 5-10 seconds Total Score: 53         Pertinent Vitals/Pain Pain Assessment: No/denies pain    Home Living Family/patient expects to be discharged to:: Private residence Living Arrangements: Alone Available Help at Discharge: Family Type of Home: House Home Access: Stairs to enter Entrance Stairs-Rails: None Entrance Stairs-Number of Steps: 1 Home Layout: One level Home Equipment: Environmental consultant - 4 wheels;Cane - single point (no shower chair or BSC)      Prior Function Level of Independence: Independent         Comments: Independent with ADLs/IADLs, drives, full community ambulator without assistive device     Hand Dominance   Dominant Hand: Right    Extremity/Trunk Assessment   Upper Extremity Assessment: Overall WFL for tasks assessed           Lower Extremity Assessment: Overall WFL for tasks assessed         Communication   Communication: No difficulties  Cognition Arousal/Alertness: Awake/alert Behavior During Therapy: WFL for tasks assessed/performed Overall Cognitive Status: Within Functional Limits for tasks assessed                      General Comments      Exercises     Assessment/Plan    PT Assessment Patent does not need any further PT services  PT Problem List            PT Treatment Interventions      PT Goals (Current goals can be found in the Care Plan section)  Acute Rehab PT Goals PT Goal Formulation: All assessment and education complete, DC therapy    Frequency     Barriers to discharge        Co-evaluation               End of Session Equipment Utilized During Treatment: Gait belt Activity Tolerance: Patient tolerated treatment well Patient left: in chair;with call bell/phone within reach           Time: 1120-1138 PT Time Calculation (min) (ACUTE ONLY): 18 min   Charges:   PT Evaluation $PT Eval Low Complexity: 1  Procedure     PT G Codes:       Lyndel Safe Sione Baumgarten PT, DPT   Delia Slatten 09/01/2016, 11:39 AM

## 2016-09-01 NOTE — Care Management Important Message (Signed)
Important Message  Patient Details  Name: [name] MRN: [id number] Date of Birth: 07-29-1928   Medicare Important Message Given:  Yes    Jolly Mango, RN 09/01/2016, 9:32 AM

## 2016-09-01 NOTE — Progress Notes (Signed)
D/C paperwork discussed with pt and daughter who's at bedside. Rx card and AVS preview given to patient, VS wnl at time of d/c, no ss of distress noted. IV removed, no bleeding noted.

## 2016-09-03 ENCOUNTER — Other Ambulatory Visit: Payer: Self-pay | Admitting: Cardiovascular Disease

## 2016-09-05 NOTE — Telephone Encounter (Signed)
Please review for refill. Thanks!  

## 2016-09-08 ENCOUNTER — Telehealth: Payer: Self-pay | Admitting: Internal Medicine

## 2016-09-08 ENCOUNTER — Ambulatory Visit (INDEPENDENT_AMBULATORY_CARE_PROVIDER_SITE_OTHER): Payer: Medicare Other | Admitting: Cardiology

## 2016-09-08 DIAGNOSIS — Z5181 Encounter for therapeutic drug level monitoring: Secondary | ICD-10-CM

## 2016-09-08 LAB — POCT INR: INR: 3.1

## 2016-09-08 NOTE — Telephone Encounter (Signed)
I called and spoke with Rip Harbour- with [facility]. She reports that with the patient's recent hospitalization, there was day when her coumadin was held. The patient is concerned that her level is not correct. Rip Harbour was asking for an order to go ahead and recheck the patient's PT/INR level. I advised this is ok to do and that I will notify [facility]. Rip Harbour will call results back to [facility].

## 2016-09-08 NOTE — Telephone Encounter (Signed)
See Anticoagulation Track

## 2016-09-08 NOTE — Telephone Encounter (Signed)
New Message:    [name] would like an order to do a PT at home,pt was discharged from the hospital on 09-01-16.

## 2016-09-15 ENCOUNTER — Ambulatory Visit (INDEPENDENT_AMBULATORY_CARE_PROVIDER_SITE_OTHER): Payer: Medicare Other

## 2016-09-15 ENCOUNTER — Telehealth: Payer: Self-pay | Admitting: Internal Medicine

## 2016-09-15 DIAGNOSIS — I4891 Unspecified atrial fibrillation: Secondary | ICD-10-CM

## 2016-09-15 DIAGNOSIS — Z5181 Encounter for therapeutic drug level monitoring: Secondary | ICD-10-CM

## 2016-09-15 LAB — POCT INR: INR: 3.2

## 2016-09-15 NOTE — Telephone Encounter (Signed)
Results noted see anticoagulation note in Epic. 

## 2016-09-15 NOTE — Telephone Encounter (Signed)
[name] with [facility] giving INR results  INR 3.2 PT 39  Please advise

## 2016-09-20 ENCOUNTER — Encounter: Payer: Self-pay | Admitting: Internal Medicine

## 2016-09-20 ENCOUNTER — Ambulatory Visit (INDEPENDENT_AMBULATORY_CARE_PROVIDER_SITE_OTHER): Payer: Medicare Other | Admitting: Internal Medicine

## 2016-09-20 ENCOUNTER — Ambulatory Visit (INDEPENDENT_AMBULATORY_CARE_PROVIDER_SITE_OTHER): Payer: Medicare Other

## 2016-09-20 VITALS — BP 122/60 | HR 63 | Ht 62.0 in | Wt 119.5 lb

## 2016-09-20 DIAGNOSIS — I442 Atrioventricular block, complete: Secondary | ICD-10-CM

## 2016-09-20 DIAGNOSIS — I4891 Unspecified atrial fibrillation: Secondary | ICD-10-CM

## 2016-09-20 DIAGNOSIS — I482 Chronic atrial fibrillation: Secondary | ICD-10-CM | POA: Diagnosis not present

## 2016-09-20 DIAGNOSIS — Z950 Presence of cardiac pacemaker: Secondary | ICD-10-CM | POA: Diagnosis not present

## 2016-09-20 DIAGNOSIS — Z5181 Encounter for therapeutic drug level monitoring: Secondary | ICD-10-CM

## 2016-09-20 DIAGNOSIS — Z7901 Long term (current) use of anticoagulants: Secondary | ICD-10-CM | POA: Diagnosis not present

## 2016-09-20 DIAGNOSIS — I4821 Permanent atrial fibrillation: Secondary | ICD-10-CM

## 2016-09-20 LAB — POCT INR: INR: 2.3

## 2016-09-20 NOTE — Progress Notes (Signed)
Patient Care Team: Adin Hector, MD as PCP - General (Internal Medicine)   HPI  [name] is a 82 y.o. female With a history of permanent atrial fibrillation status post AV junction ablation for uncontrolled ventricular response this subsequently implanted pacemaker. She underwent generator replacement February 2014 which was inadvertently associated with failure to activate rate response. This was subsequently activated: there has  Been  overresponsiveness of her sensor causing her to have a sensation of tachypalpitations which was addressed by reprogramming;  More recently we had increased rate response 2/2 poor exercise tolerance.  This is much improved    Exercise tolerance has been much improved since reprogramming. Intercurrently, she has unfortunately been hospitalized with recurrent C. difficile infection. 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  She has a history of remote catheterization in the 1990s. At that time her arteries were clean.   stress test in 2001 that was nonischemic and Myoview 2014 that was unrevealing;  echocardiogram 2013 demonstrated normal LV function      Past Medical History:  Diagnosis Date  . Atrial fibrillation ([place]) 07/20/2009   Qualifier: Diagnosis of  By: Caryl Comes, MD, Leonidas Romberg Mack Guise   . C. difficile colitis    feces transplant  . Complete heart block  s/p av ablation    with atrial fibrillation. Pt is s/p AV nodal ablation due to permanent and symptomatic atrial fibrillation.   . Coronary artery disease    Nonobstructive  . Diverticulitis   . GERD (gastroesophageal reflux disease)   . Hereditary hemochromatosis ([place])    Phlebotomized in the past, not getting phlebotomy now  . Hyperlipidemia   . Hypertension   . Non Hodgkin's lymphoma ([place])    stable; followed by an oncologist  . Osteoarthritis   . 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 Pacemaker-dual-chamber-Medtronic 04/04/2011    Past Surgical  History:  Procedure Laterality Date  . ABDOMINAL HYSTERECTOMY    . ATRIAL ABLATION SURGERY  2008   AV nodal ablation  . BREAST BIOPSY  benign  . CARDIAC CATHETERIZATION    . CARDIOVERSION    . CHOLECYSTECTOMY    . COLONOSCOPY  oct.12, 2012  . INSERT / REPLACE / REMOVE PACEMAKER    . PACEMAKER GENERATOR CHANGE N/A 12/17/2012   Procedure: PACEMAKER GENERATOR CHANGE;  Surgeon: Deboraha Sprang, MD;  [facility] C[facility] LAB;  Service: Cardiovascular;  Laterality: N/A;  . stool transplant    . US ECHOCARDIOGRAPHY  07/2008   EF 55%, mild LVH. No significant valvular abnormalities    Current Outpatient Prescriptions  Medication Sig Dispense Refill  . ALPRAZolam (XANAX) 0.25 MG tablet Take 0.5 mg by mouth at bedtime as needed for sleep.    [name] amLODipine (NORVASC) 5 MG tablet TAKE ONE TABLET BY MOUTH ONCE DAILY    . Cetirizine HCl (ZYRTEC ALLERGY PO) Take by mouth daily.     . fluticasone (FLONASE) 50 MCG/ACT nasal spray Place 2 sprays into the nose daily as needed for allergies.     . folic acid (FOLVITE) 1 MG tablet Take 1 mg by mouth daily.      [name] gabapentin (NEURONTIN) 100 MG capsule Take 100 mg by mouth. Takes 1 tablet in the am daily.    [name] gabapentin (NEURONTIN) 400 MG capsule Take 400 mg by mouth at bedtime.     . meclizine (ANTIVERT) 25 MG tablet Take 1 tablet (25 mg total) by mouth 3 (three) times  daily as needed. 30 tablet 0  . polyvinyl alcohol (LIQUIFILM TEARS) 1.4 % ophthalmic solution Place 2 drops into both eyes 3 (three) times daily as needed (for dry eyes).    . vancomycin (VANCOCIN) 50 mg/mL oral solution Take 5 mLs (250 mg total) by mouth every 6 (six) hours. 240 mL 0  . warfarin (COUMADIN) 2 MG tablet TAKE ONE TABLET BY MOUTH ONCE DAILY AS DIRECTED 30 tablet 3  . warfarin (COUMADIN) 3 MG tablet Take as directed by coumadin clinic (Patient taking differently: Take 3 mg by mouth daily. 3mg  On Monday and 2 mg on all other days) 20 tablet 3   No current facility-administered  medications for this visit.     Allergies  Allergen Reactions  . Amoxicillin   . Cefdinir Diarrhea  . Cilostazol     Other reaction(s): Headache  . Levofloxacin   . Plavix [Clopidogrel Bisulfate]     12/12/11 patient reports she has never taken Plavix before.   . Sulfur Nausea Only    Review of Systems negative except from HPI and PMH  Physical Exam BP 122/60 (BP Location: Left Arm, Patient Position: Sitting, Cuff Size: Normal)   Pulse 63   Ht 5\' 2"  (1.575 m)   Wt 119 lb 8 oz (54.2 kg)   BMI 21.86 kg/m  Well developed and nourished in no acute distress HENT normal Neck supple with JVP-flat Clear Device pocket well healed; without hematoma or erythema.  There is no tethering  Regular rate and rhythm, no murmurs or gallops Abd-soft with active BS No Clubbing cyanosis edema Skin-warm and dry A & Oriented  Grossly normal sensory and motor function  ECG demonstrates ventricular pacing with complete heart block and underlying afib  Device interrogation demonstrates that almost all of her heart beats are in the less than 70 bpm bin  Assessment and  Plan  Complete heart block S./P. AV nodal ablation  Atrial fibrillation permanent  Chronotropic incompetence  Daysman on exertion     From a cardiac point oaf view, she is as well as I have seen her.  Continue current meds

## 2016-09-20 NOTE — Patient Instructions (Signed)
Medication Instructions: - Your physician recommends that you continue on your current medications as directed. Please refer to the Current Medication list given to you today.  Labwork: - none ordered  Procedures/Testing: - none ordered  Follow-Up: - Remote monitoring is used to monitor your Pacemaker of ICD from home. This monitoring reduces the number of office visits required to check your device to one time per year. It allows [place] to keep an eye on the functioning of your device to ensure it is working properly. You are scheduled for a device check from home on 12/20/16. You may send your transmission at any time that day. If you have a wireless device, the transmission will be sent automatically. After your physician reviews your transmission, you will receive a postcard with your next transmission date.  - Your physician wants you to follow-up in: 6 months with Dr. Caryl Comes. You will receive a reminder letter in the mail two months in advance. If you don't receive a letter, please call our office to schedule the follow-up appointment.  Any Additional Special Instructions Will Be Listed Below (If Applicable).     If you need a refill on your cardiac medications before your next appointment, please call your pharmacy.

## 2016-09-27 ENCOUNTER — Ambulatory Visit (INDEPENDENT_AMBULATORY_CARE_PROVIDER_SITE_OTHER): Payer: Medicare Other | Admitting: Cardiology

## 2016-09-27 DIAGNOSIS — Z5181 Encounter for therapeutic drug level monitoring: Secondary | ICD-10-CM

## 2016-09-27 LAB — POCT INR: INR: 2.6

## 2016-09-29 ENCOUNTER — Encounter: Payer: Self-pay | Admitting: Internal Medicine

## 2016-09-29 DIAGNOSIS — J069 Acute upper respiratory infection, unspecified: Secondary | ICD-10-CM | POA: Insufficient documentation

## 2016-09-30 ENCOUNTER — Encounter: Payer: Self-pay | Admitting: Urology

## 2016-09-30 ENCOUNTER — Ambulatory Visit (INDEPENDENT_AMBULATORY_CARE_PROVIDER_SITE_OTHER): Payer: Medicare Other | Admitting: Urology

## 2016-09-30 VITALS — BP 121/78 | HR 64 | Ht 62.0 in | Wt 118.5 lb

## 2016-09-30 DIAGNOSIS — N816 Rectocele: Secondary | ICD-10-CM

## 2016-09-30 DIAGNOSIS — N362 Urethral caruncle: Secondary | ICD-10-CM

## 2016-09-30 DIAGNOSIS — N393 Stress incontinence (female) (male): Secondary | ICD-10-CM | POA: Diagnosis not present

## 2016-09-30 DIAGNOSIS — N3941 Urge incontinence: Secondary | ICD-10-CM

## 2016-09-30 LAB — BLADDER SCAN AMB NON-IMAGING: SCAN RESULT: 96

## 2016-09-30 NOTE — Progress Notes (Signed)
11:30 AM  04/13/16  [name] 1928-01-20 [id number]  Referring provider: Adin Hector, MD [address], [place] [address]  Chief Complaint  Patient presents with  . Urinary Incontinence    HPI: 62 y F with OAB, pelvic organ prolapse with symptomatic rectocele who returns to the office today.  He was last seen in July 2017 and her follow-up has been delayed due to recent medical illness.  He continues to have significant overactivity symptoms and urge incontinence.  Failed Mybetriq 25, little benefit.  She is not sure if the 50 mg was helpful.  She stopped taking this when illness not resume the medication. She still does have samples at home.  She does have irritable bowel syndrome and history of C. difficile and alternates from constipation to loose stools. She does occasionally strain to evacuate her stool.  In the interim, she has been hospitiatized x 4 days again with C. Diff.   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    She does report that she saw Dr. Enzo Bi shortly after her visit with [place] this summer. She was given estrogen cream which she used for about a month but then stopped. She was supposed to go back for pessary fitting but never returned again, due to medical illness.  PMH: Past Medical History:  Diagnosis Date  . Atrial fibrillation ([place]) 07/20/2009   Qualifier: Diagnosis of  By: Caryl Comes, MD, Leonidas Romberg Mack Guise   . C. difficile colitis    feces transplant  . Complete heart block  s/p av ablation    with atrial fibrillation. Pt is s/p AV nodal ablation due to permanent and symptomatic atrial fibrillation.   . Coronary artery disease    Nonobstructive  . Diverticulitis   . GERD (gastroesophageal reflux disease)   . Hereditary hemochromatosis ([place])    Phlebotomized in the past, not getting phlebotomy now  . Hyperlipidemia   . Hypertension   . Non Hodgkin's lymphoma ([place])    stable; followed  by an oncologist  . Osteoarthritis   . 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 Pacemaker-dual-chamber-Medtronic 04/04/2011    Surgical History: Past Surgical History:  Procedure Laterality Date  . ABDOMINAL HYSTERECTOMY    . ATRIAL ABLATION SURGERY  2008   AV nodal ablation  . BREAST BIOPSY  benign  . CARDIAC CATHETERIZATION    . CARDIOVERSION    . CHOLECYSTECTOMY    . COLONOSCOPY  oct.12, 2012  . INSERT / REPLACE / REMOVE PACEMAKER    . PACEMAKER GENERATOR CHANGE N/A 12/17/2012   Procedure: PACEMAKER GENERATOR CHANGE;  Surgeon: Deboraha Sprang, MD;  Location: [facility] CATH LAB;  Service: Cardiovascular;  Laterality: N/A;  . stool transplant    . US ECHOCARDIOGRAPHY  07/2008   EF 55%, mild LVH. No significant valvular abnormalities    Home Medications:    Medication List       Accurate as of 09/30/16 11:30 AM. Always use your most recent med list.          ALPRAZolam 0.25 MG tablet Commonly known as:  XANAX Take 0.5 mg by mouth at bedtime as needed for sleep.   amLODipine 5 MG tablet Commonly known as:  NORVASC TAKE ONE TABLET BY MOUTH ONCE DAILY   fluticasone 50 MCG/ACT nasal spray Commonly known as:  FLONASE Place 2 sprays into the nose daily as needed for allergies.   folic acid 1 MG tablet Commonly known as:  FOLVITE Take 1 mg by mouth daily.   gabapentin 100 MG capsule Commonly known as:  NEURONTIN Take 100 mg by mouth. Takes 1 tablet in the am daily.   gabapentin 400 MG capsule Commonly known as:  NEURONTIN Take 400 mg by mouth at bedtime.   meclizine 25 MG tablet Commonly known as:  ANTIVERT Take 1 tablet (25 mg total) by mouth 3 (three) times daily as needed.   polyvinyl alcohol 1.4 % ophthalmic solution Commonly known as:  LIQUIFILM TEARS Place 2 drops into both eyes 3 (three) times daily as needed (for dry eyes).   vancomycin 50 mg/mL oral solution Commonly known as:  VANCOCIN Take 5 mLs (250 mg total) by mouth every 6 (six) hours.   warfarin 3 MG  tablet Commonly known as:  COUMADIN Take as directed by coumadin clinic   warfarin 2 MG tablet Commonly known as:  COUMADIN TAKE ONE TABLET BY MOUTH ONCE DAILY AS DIRECTED   ZYRTEC ALLERGY PO Take by mouth daily.       Allergies:  Allergies  Allergen Reactions  . Amoxicillin   . Cefdinir Diarrhea  . Cilostazol     Other reaction(s): Headache  . Levofloxacin   . Plavix [Clopidogrel Bisulfate]     12/12/11 patient reports she has never taken Plavix before.   . Sulfur Nausea Only    Family History: Family History  Problem Relation Age of Onset  . Liver disease Father   . Cancer Mother   . Prostate cancer Neg Hx   . Kidney disease Neg Hx   . Kidney cancer Neg Hx   . Breast cancer Neg Hx   . Ovarian cancer Neg Hx   . Colon cancer Neg Hx     Social History:  reports that she quit smoking about 21 years ago. She has never used smokeless tobacco. She reports that she does not drink alcohol or use drugs.  ROS: UROLOGY Frequent Urination?: No Hard to postpone urination?: No Burning/pain with urination?: No Get up at night to urinate?: Yes Leakage of urine?: No Urine stream starts and stops?: Yes Trouble starting stream?: No Do you have to strain to urinate?: No Blood in urine?: No Urinary tract infection?: No Sexually transmitted disease?: No Injury to kidneys or bladder?: No Painful intercourse?: No Weak stream?: No Currently pregnant?: No Vaginal bleeding?: No Last menstrual period?: n  Gastrointestinal Nausea?: No Vomiting?: No Indigestion/heartburn?: No Diarrhea?: No Constipation?: No  Constitutional Fever: No Night sweats?: No Weight loss?: No Fatigue?: Yes  Skin Skin rash/lesions?: No Itching?: No  Eyes Blurred vision?: No Double vision?: No  Ears/Nose/Throat Sore throat?: No Sinus problems?: No  Hematologic/Lymphatic Swollen glands?: No Easy bruising?: No  Cardiovascular Leg swelling?: No Chest pain?: No  Respiratory Cough?:  No Shortness of breath?: No  Endocrine Excessive thirst?: No  Musculoskeletal Back pain?: No Joint pain?: No  Neurological Headaches?: No Dizziness?: No  Psychologic Depression?: No Anxiety?: Yes  Physical Exam: BP 121/78 (BP Location: Left Arm, Patient Position: Sitting, Cuff Size: Normal)   Pulse 64   Ht 5\' 2"  (1.575 m)   Wt 118 lb 8 oz (53.8 kg)   BMI 21.67 kg/m   Constitutional:  Alert and oriented, No acute distress.  Well dressed.  HEENT: [place] AT, moist mucus membranes.  Trachea midline, no masses. Cardiovascular: No clubbing, cyanosis, or edema. Respiratory: Normal respiratory effort, no increased work of breathing. Skin: No rashes, bruises or suspicious lesions. Psychiatric: Normal mood and affect.   Laboratory Data:  Lab Results  Component Value Date   WBC 8.3 08/31/2016   HGB 14.6 08/31/2016   HCT 43.0 08/31/2016   MCV 92.5 08/31/2016   PLT 143 (L) 08/31/2016    Lab Results  Component Value Date   CREATININE 0.71 08/30/2016   Results for orders placed or performed in visit on 09/30/16  BLADDER SCAN AMB NON-IMAGING  Result Value Ref Range   Scan Result 96     Assessment & Plan:    1. Urge incontinence Resumed 50 mg Mybetriq If no effect, discussed options for refractory OAB/urge incontinence --> PTNS, botox, interstim Given her age, I'm hesitant to recommend an anticholinergic She was advised to call if she is still symptomatic on Mybetriq. She is not a good surgical candidate, therefore would recommend PTNS for refractory symptoms. We did discuss the treatment regimen in detail and will call us and let us know if she like to proceed with that.  2. Stress incontinence, female Minimal baseline stress urinary incontinence, advised to resume Kegels  3. Urethral caruncle Advised to resume estrace cream prescribed by Dr. Enzo Bi  4. Rectocele Advised to reschedule Dr. Enzo Bi should like to proceed with pessary fitting  Follow-up as  needed, she will call us if she like to fill a prescription for Mybetriq 50 or if should like to initiate PTNS  Hollice Espy, MD  [facility] [address] [place], [place] [address] [phone]

## 2016-10-07 ENCOUNTER — Other Ambulatory Visit: Payer: Self-pay | Admitting: Internal Medicine

## 2016-10-07 DIAGNOSIS — R7989 Other specified abnormal findings of blood chemistry: Secondary | ICD-10-CM

## 2016-10-07 DIAGNOSIS — R945 Abnormal results of liver function studies: Principal | ICD-10-CM

## 2016-10-11 ENCOUNTER — Ambulatory Visit (INDEPENDENT_AMBULATORY_CARE_PROVIDER_SITE_OTHER): Payer: Medicare Other | Admitting: Cardiology

## 2016-10-11 DIAGNOSIS — Z5181 Encounter for therapeutic drug level monitoring: Secondary | ICD-10-CM

## 2016-10-11 LAB — POCT INR: INR: 2.6

## 2016-10-14 ENCOUNTER — Ambulatory Visit
Admission: RE | Admit: 2016-10-14 | Discharge: 2016-10-14 | Disposition: A | Discharge status: Home / Self Care | Payer: Medicare Other | Source: Ambulatory Visit | Attending: Internal Medicine | Admitting: Internal Medicine

## 2016-10-14 DIAGNOSIS — K862 Cyst of pancreas: Secondary | ICD-10-CM | POA: Insufficient documentation

## 2016-10-14 DIAGNOSIS — Z9049 Acquired absence of other specified parts of digestive tract: Secondary | ICD-10-CM | POA: Diagnosis not present

## 2016-10-14 DIAGNOSIS — R945 Abnormal results of liver function studies: Secondary | ICD-10-CM

## 2016-10-14 DIAGNOSIS — R7989 Other specified abnormal findings of blood chemistry: Secondary | ICD-10-CM | POA: Insufficient documentation

## 2016-10-17 ENCOUNTER — Inpatient Hospital Stay: Payer: Medicare Other | Attending: Oncology

## 2016-10-17 NOTE — Progress Notes (Deleted)
[name]  [phone] [phone]) [phone]  ID: NOTNAMED CROUCHER OB: October 18, 1928  MR#: [id number]  JGO#:[id number]  Patient Care Team: Adin Hector, MD as PCP - General (Internal Medicine)  CHIEF COMPLAINT: Hereditary hemochromatosis, CLL.  INTERVAL HISTORY: Patient is an 82 year old female whose last evaluated in clinic in 3-4 years ago. She is referred back for consideration of reinitiating phlebotomy after noting her hemoglobin is trending up. Currently, she feels well and is asymptomatic. She has no neurologic complaints. She denies any weakness or fatigue. She has a good appetite and denies weight loss. She denies any recent fevers or illnesses. She has no chest pain or shortness of breath. She denies any nausea, vomiting, constipation, or diarrhea. She has no urinary complaints. Patient feels at her baseline and offers no specific complaints today.  REVIEW OF SYSTEMS:   Review of Systems  Constitutional: Negative.  Negative for fever, malaise/fatigue and weight loss.  Respiratory: Negative.  Negative for cough.   Cardiovascular: Negative.  Negative for chest pain and leg swelling.  Gastrointestinal: Negative.  Negative for abdominal pain, blood in stool and melena.  Genitourinary: Negative.   Neurological: Negative.  Negative for sensory change and weakness.  Psychiatric/Behavioral: Negative.  The patient is not nervous/anxious.     As per HPI. Otherwise, a complete review of systems is negative.  PAST MEDICAL HISTORY: Past Medical History:  Diagnosis Date  . Atrial fibrillation ([place]) 07/20/2009   Qualifier: Diagnosis of  By: Caryl Comes, MD, Leonidas Romberg Mack Guise   . C. difficile colitis    feces transplant  . Complete heart block  s/p av ablation    with atrial fibrillation. Pt is s/p AV nodal ablation due to permanent and symptomatic atrial fibrillation.   . Coronary artery disease    Nonobstructive  . Diverticulitis   . GERD (gastroesophageal  reflux disease)   . Hereditary hemochromatosis ([place])    Phlebotomized in the past, not getting phlebotomy now  . Hyperlipidemia   . Hypertension   . Non Hodgkin's lymphoma ([place])    stable; followed by an oncologist  . Osteoarthritis   . 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 Pacemaker-dual-chamber-Medtronic 04/04/2011    PAST SURGICAL HISTORY: Past Surgical History:  Procedure Laterality Date  . ABDOMINAL HYSTERECTOMY    . ATRIAL ABLATION SURGERY  2008   AV nodal ablation  . BREAST BIOPSY  benign  . CARDIAC CATHETERIZATION    . CARDIOVERSION    . CHOLECYSTECTOMY    . COLONOSCOPY  oct.12, 2012  . INSERT / REPLACE / REMOVE PACEMAKER    . PACEMAKER GENERATOR CHANGE N/A 12/17/2012   Procedure: PACEMAKER GENERATOR CHANGE;  Surgeon: Deboraha Sprang, MD;  Location: [facility] CATH LAB;  Service: Cardiovascular;  Laterality: N/A;  . stool transplant    . US ECHOCARDIOGRAPHY  07/2008   EF 55%, mild LVH. No significant valvular abnormalities    FAMILY HISTORY: Family History  Problem Relation Age of Onset  . Liver disease Father   . Cancer Mother   . Prostate cancer Neg Hx   . Kidney disease Neg Hx   . Kidney cancer Neg Hx   . Breast cancer Neg Hx   . Ovarian cancer Neg Hx   . Colon cancer Neg Hx     ADVANCED DIRECTIVES (Y/N):  N  HEALTH MAINTENANCE: Social History  Substance Use Topics  . Smoking status: Former Smoker    Quit date: 1996  . Smokeless tobacco: Never Used  . Alcohol use No  Colonoscopy:  PAP:  Bone density:  Lipid panel:  Allergies  Allergen Reactions  . Amoxicillin   . Cefdinir Diarrhea  . Cilostazol     Other reaction(s): Headache  . Levofloxacin   . Plavix [Clopidogrel Bisulfate]     12/12/11 patient reports she has never taken Plavix before.   . Sulfur Nausea Only    Current Outpatient Prescriptions  Medication Sig Dispense Refill  . ALPRAZolam (XANAX) 0.25 MG tablet Take 0.5 mg by mouth at bedtime as needed for sleep.    [name] amLODipine (NORVASC) 5 MG  tablet TAKE ONE TABLET BY MOUTH ONCE DAILY    . Cetirizine HCl (ZYRTEC ALLERGY PO) Take by mouth daily.     . fluticasone (FLONASE) 50 MCG/ACT nasal spray Place 2 sprays into the nose daily as needed for allergies.     . folic acid (FOLVITE) 1 MG tablet Take 1 mg by mouth daily.      [name] gabapentin (NEURONTIN) 100 MG capsule Take 100 mg by mouth. Takes 1 tablet in the am daily.    [name] gabapentin (NEURONTIN) 400 MG capsule Take 400 mg by mouth at bedtime.     . meclizine (ANTIVERT) 25 MG tablet Take 1 tablet (25 mg total) by mouth 3 (three) times daily as needed. 30 tablet 0  . polyvinyl alcohol (LIQUIFILM TEARS) 1.4 % ophthalmic solution Place 2 drops into both eyes 3 (three) times daily as needed (for dry eyes).    . vancomycin (VANCOCIN) 50 mg/mL oral solution Take 5 mLs (250 mg total) by mouth every 6 (six) hours. 240 mL 0  . warfarin (COUMADIN) 2 MG tablet TAKE ONE TABLET BY MOUTH ONCE DAILY AS DIRECTED 30 tablet 3  . warfarin (COUMADIN) 3 MG tablet Take as directed by coumadin clinic (Patient taking differently: Take 3 mg by mouth daily. '3mg'$  On Monday and 2 mg on all other days) 20 tablet 3   No current facility-administered medications for this visit.     OBJECTIVE: There were no vitals filed for this visit.   There is no height or weight on file to calculate BMI.    ECOG FS:0 - Asymptomatic  General: Well-developed, well-nourished, no acute distress. Eyes: Pink conjunctiva, anicteric sclera. HEENT: Normocephalic, moist mucous membranes, clear oropharnyx. Lungs: Clear to auscultation bilaterally. Heart: Regular rate and rhythm. No rubs, murmurs, or gallops. Abdomen: Soft, nontender, nondistended. No organomegaly noted, normoactive bowel sounds. Musculoskeletal: No edema, cyanosis, or clubbing. Neuro: Alert, answering all questions appropriately. Cranial nerves grossly intact. Skin: No rashes or petechiae noted. Psych: Normal affect. Lymphatics: No cervical, calvicular, axillary or  inguinal LAD.   LAB RESULTS:  Lab Results  Component Value Date   NA 136 08/30/2016   K 3.9 08/30/2016   CL 112 (H) 08/30/2016   CO2 21 (L) 08/30/2016   GLUCOSE 113 (H) 08/30/2016   BUN 6 08/30/2016   CREATININE 0.71 08/30/2016   CALCIUM 8.3 (L) 08/30/2016   PROT 5.6 (L) 08/30/2016   ALBUMIN 3.3 (L) 08/30/2016   AST 26 08/30/2016   ALT 22 08/30/2016   ALKPHOS 73 08/30/2016   BILITOT 0.9 08/30/2016   GFRNONAA >60 08/30/2016   GFRAA >60 08/30/2016    Lab Results  Component Value Date   WBC 8.3 08/31/2016   NEUTROABS 3.6 07/20/2016   HGB 14.6 08/31/2016   HCT 43.0 08/31/2016   MCV 92.5 08/31/2016   PLT 143 (L) 08/31/2016     STUDIES: US Abdomen Limited Ruq  Result Date: 10/14/2016 CLINICAL DATA:  Elevated liver function tests. EXAM: US ABDOMEN LIMITED - RIGHT UPPER QUADRANT COMPARISON:  Ultrasound of August 30, 2016. CT scan of June 05, 2014. FINDINGS: Gallbladder: Status post cholecystectomy. Common bile duct: Diameter: 2.3 mm which is within normal limits. Liver: No focal hepatic lesion is noted. Hepatic parenchyma is normal in echogenicity. Stable mild intrahepatic biliary dilatation is noted most likely due to post cholecystectomy status. 2.4 cm rounded low density is again noted in pancreatic head which is not significantly changed compared to prior exam. IMPRESSION: Status post cholecystectomy. Stable mild intrahepatic biliary dilatation is noted most likely due to post cholecystectomy status. Stable 2.4 cm cystic lesion in pancreatic head. Most likely a pseudocyst. Indolent neoplasm, such as intraductal papillary mucinous tumor could look similar. Per consensus criteria, follow-up with preferably pre and post contrast abdominal MRI at 1 year is recommended. This recommendation follows ACR consensus guidelines: Managing Incidental Findings on Abdominal CT: White Paper of the ACR Incidental Findings Committee. J Am Coll Radiol 2010;7:754-773. Electronically Signed   By:  Marijo Conception, M.D.   On: 10/14/2016 08:56    ASSESSMENT: Hereditary hemochromatosis, CLL.  PLAN:    1. Hereditary hemochromatosis: Patient has not had phlebotomy and at least 3-4 years. Her hemoglobin has trended up and is 17.3 today. Iron stores are pending at time of dictation. Return to clinic in 1 week for phlebotomy. We will base the need for additional phlebotomy once iron stores are resulted. Patient will return to clinic in 3 months for repeat laboratory work and further evaluation. 2. CLL: By report, diagnosed by bone marrow biopsy. Patient's white blood cell count continues to be within normal limits and she does not have a lymphocytosis. Peripheral blood flow cytometry was repeated today for completeness. No intervention is needed at this time. Return to clinic as above.  Patient expressed understanding and was in agreement with this plan. She also understands that She can call clinic at any time with any questions, concerns, or complaints.    Lloyd Huger, MD   10/17/2016 10:30 PM

## 2016-10-19 ENCOUNTER — Inpatient Hospital Stay: Payer: Medicare Other

## 2016-10-19 ENCOUNTER — Inpatient Hospital Stay: Payer: Medicare Other | Admitting: Oncology

## 2016-11-01 ENCOUNTER — Ambulatory Visit (INDEPENDENT_AMBULATORY_CARE_PROVIDER_SITE_OTHER): Payer: Medicare Other | Admitting: Cardiology

## 2016-11-01 DIAGNOSIS — Z5181 Encounter for therapeutic drug level monitoring: Secondary | ICD-10-CM

## 2016-11-01 LAB — POCT INR: INR: 1.9

## 2016-11-10 ENCOUNTER — Telehealth: Payer: Self-pay | Admitting: Internal Medicine

## 2016-11-10 NOTE — Telephone Encounter (Signed)
Patient has been feeling poor and not eating.  She would like to come to office today to check her coumadin.  Please call.

## 2016-11-10 NOTE — Telephone Encounter (Signed)
Patient presented to front office staff wanting to have her INR checked because she felt tired and loss of appetite. She was out on errands and decided to come into office.  Patient had last INR check 11/01/16 and f/u check is scheduled in 3 weeks on 11/23/16. Patient stated she's been taking her coumadin as prescribed. She's had diarrhea for the last 2 days. She is concerned her blood levels are off. Patient decreased appetite. Advised patient to contact her PCP about her current symptoms and to keep her upcoming INR check. Offered her appt with first available on 11/15/16 with Christell Faith, PA. Patient declined this appt. She will contact her PCP.

## 2016-11-13 NOTE — Progress Notes (Deleted)
[place]  Telephone:(336) [phone] Fax:(336) [phone]  ID: [name] OB: 30-Dec-1927  MR#: [id number]  KTG#:[id number]  Patient Care Team: Adin Hector, MD as PCP - General (Internal Medicine)  CHIEF COMPLAINT: Hereditary hemochromatosis, CLL.  INTERVAL HISTORY: Patient is an 83 year old female whose last evaluated in clinic in 3-4 years ago. She is referred back for consideration of reinitiating phlebotomy after noting her hemoglobin is trending up. Currently, she feels well and is asymptomatic. She has no neurologic complaints. She denies any weakness or fatigue. She has a good appetite and denies weight loss. She denies any recent fevers or illnesses. She has no chest pain or shortness of breath. She denies any nausea, vomiting, constipation, or diarrhea. She has no urinary complaints. Patient feels at her baseline and offers no specific complaints today.  REVIEW OF SYSTEMS:   Review of Systems  Constitutional: Negative.  Negative for fever, malaise/fatigue and weight loss.  Respiratory: Negative.  Negative for cough.   Cardiovascular: Negative.  Negative for chest pain and leg swelling.  Gastrointestinal: Negative.  Negative for abdominal pain, blood in stool and melena.  Genitourinary: Negative.   Neurological: Negative.  Negative for sensory change and weakness.  Psychiatric/Behavioral: Negative.  The patient is not nervous/anxious.     As per HPI. Otherwise, a complete review of systems is negative.  PAST MEDICAL HISTORY: Past Medical History:  Diagnosis Date  . Atrial fibrillation ([place]) 07/20/2009   Qualifier: Diagnosis of  By: Caryl Comes, MD, Leonidas Romberg Mack Guise   . C. difficile colitis    feces transplant  . Complete heart block  s/p av ablation    with atrial fibrillation. Pt is s/p AV nodal ablation due to permanent and symptomatic atrial fibrillation.   . Coronary artery disease    Nonobstructive  . Diverticulitis   . GERD (gastroesophageal  reflux disease)   . Hereditary hemochromatosis ([place])    Phlebotomized in the past, not getting phlebotomy now  . Hyperlipidemia   . Hypertension   . Non Hodgkin's lymphoma ([place])    stable; followed by an oncologist  . Osteoarthritis   . 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 Pacemaker-dual-chamber-Medtronic 04/04/2011    PAST SURGICAL HISTORY: Past Surgical History:  Procedure Laterality Date  . ABDOMINAL HYSTERECTOMY    . ATRIAL ABLATION SURGERY  2008   AV nodal ablation  . BREAST BIOPSY  benign  . CARDIAC CATHETERIZATION    . CARDIOVERSION    . CHOLECYSTECTOMY    . COLONOSCOPY  oct.12, 2012  . INSERT / REPLACE / REMOVE PACEMAKER    . PACEMAKER GENERATOR CHANGE N/A 12/17/2012   Procedure: PACEMAKER GENERATOR CHANGE;  Surgeon: Deboraha Sprang, MD;  Location: [facility] CATH LAB;  Service: Cardiovascular;  Laterality: N/A;  . stool transplant    . US ECHOCARDIOGRAPHY  07/2008   EF 55%, mild LVH. No significant valvular abnormalities    FAMILY HISTORY: Family History  Problem Relation Age of Onset  . Liver disease Father   . Cancer Mother   . Prostate cancer Neg Hx   . Kidney disease Neg Hx   . Kidney cancer Neg Hx   . Breast cancer Neg Hx   . Ovarian cancer Neg Hx   . Colon cancer Neg Hx     ADVANCED DIRECTIVES (Y/N):  N  HEALTH MAINTENANCE: Social History  Substance Use Topics  . Smoking status: Former Smoker    Quit date: 1996  . Smokeless tobacco: Never Used  . Alcohol use No  Colonoscopy:  PAP:  Bone density:  Lipid panel:  Allergies  Allergen Reactions  . Amoxicillin   . Cefdinir Diarrhea  . Cilostazol     Other reaction(s): Headache  . Levofloxacin   . Plavix [Clopidogrel Bisulfate]     12/12/11 patient reports she has never taken Plavix before.   . Sulfur Nausea Only    Current Outpatient Prescriptions  Medication Sig Dispense Refill  . ALPRAZolam (XANAX) 0.25 MG tablet Take 0.5 mg by mouth at bedtime as needed for sleep.    [name] amLODipine (NORVASC) 5 MG  tablet TAKE ONE TABLET BY MOUTH ONCE DAILY    . Cetirizine HCl (ZYRTEC ALLERGY PO) Take by mouth daily.     . fluticasone (FLONASE) 50 MCG/ACT nasal spray Place 2 sprays into the nose daily as needed for allergies.     . folic acid (FOLVITE) 1 MG tablet Take 1 mg by mouth daily.      [name] gabapentin (NEURONTIN) 100 MG capsule Take 100 mg by mouth. Takes 1 tablet in the am daily.    [name] gabapentin (NEURONTIN) 400 MG capsule Take 400 mg by mouth at bedtime.     . meclizine (ANTIVERT) 25 MG tablet Take 1 tablet (25 mg total) by mouth 3 (three) times daily as needed. 30 tablet 0  . polyvinyl alcohol (LIQUIFILM TEARS) 1.4 % ophthalmic solution Place 2 drops into both eyes 3 (three) times daily as needed (for dry eyes).    . vancomycin (VANCOCIN) 50 mg/mL oral solution Take 5 mLs (250 mg total) by mouth every 6 (six) hours. 240 mL 0  . warfarin (COUMADIN) 2 MG tablet TAKE ONE TABLET BY MOUTH ONCE DAILY AS DIRECTED 30 tablet 3  . warfarin (COUMADIN) 3 MG tablet Take as directed by coumadin clinic (Patient taking differently: Take 3 mg by mouth daily. 6m On Monday and 2 mg on all other days) 20 tablet 3   No current facility-administered medications for this visit.     OBJECTIVE: There were no vitals filed for this visit.   There is no height or weight on file to calculate BMI.    ECOG FS:0 - Asymptomatic  General: Well-developed, well-nourished, no acute distress. Eyes: Pink conjunctiva, anicteric sclera. HEENT: Normocephalic, moist mucous membranes, clear oropharnyx. Lungs: Clear to auscultation bilaterally. Heart: Regular rate and rhythm. No rubs, murmurs, or gallops. Abdomen: Soft, nontender, nondistended. No organomegaly noted, normoactive bowel sounds. Musculoskeletal: No edema, cyanosis, or clubbing. Neuro: Alert, answering all questions appropriately. Cranial nerves grossly intact. Skin: No rashes or petechiae noted. Psych: Normal affect. Lymphatics: No cervical, calvicular, axillary or  inguinal LAD.   LAB RESULTS:  Lab Results  Component Value Date   NA 136 08/30/2016   K 3.9 08/30/2016   CL 112 (H) 08/30/2016   CO2 21 (L) 08/30/2016   GLUCOSE 113 (H) 08/30/2016   BUN 6 08/30/2016   CREATININE 0.71 08/30/2016   CALCIUM 8.3 (L) 08/30/2016   PROT 5.6 (L) 08/30/2016   ALBUMIN 3.3 (L) 08/30/2016   AST 26 08/30/2016   ALT 22 08/30/2016   ALKPHOS 73 08/30/2016   BILITOT 0.9 08/30/2016   GFRNONAA >60 08/30/2016   GFRAA >60 08/30/2016    Lab Results  Component Value Date   WBC 8.3 08/31/2016   NEUTROABS 3.6 07/20/2016   HGB 14.6 08/31/2016   HCT 43.0 08/31/2016   MCV 92.5 08/31/2016   PLT 143 (L) 08/31/2016     STUDIES: No results found.  ASSESSMENT: Hereditary hemochromatosis, CLL.  PLAN:  1. Hereditary hemochromatosis: Patient has not had phlebotomy and at least 3-4 years. Her hemoglobin has trended up and is 17.3 today. Iron stores are pending at time of dictation. Return to clinic in 1 week for phlebotomy. We will base the need for additional phlebotomy once iron stores are resulted. Patient will return to clinic in 3 months for repeat laboratory work and further evaluation. 2. CLL: By report, diagnosed by bone marrow biopsy. Patient's white blood cell count continues to be within normal limits and she does not have a lymphocytosis. Peripheral blood flow cytometry was repeated today for completeness. No intervention is needed at this time. Return to clinic as above.  Patient expressed understanding and was in agreement with this plan. She also understands that She can call clinic at any time with any questions, concerns, or complaints.    Lloyd Huger, MD   11/13/2016 10:18 PM

## 2016-11-14 ENCOUNTER — Inpatient Hospital Stay: Payer: Medicare Other

## 2016-11-14 ENCOUNTER — Inpatient Hospital Stay: Payer: Medicare Other | Admitting: Oncology

## 2016-11-14 ENCOUNTER — Encounter: Payer: Self-pay | Admitting: *Deleted

## 2016-11-17 LAB — CUP PACEART INCLINIC DEVICE CHECK
Battery Impedance: 328 Ohm
Battery Voltage: 2.79 V
Brady Statistic RV Percent Paced: 100 %
Date Time Interrogation Session: 20171121165922
Implantable Lead Location: 753860
Lead Channel Setting Pacing Amplitude: 2.5 V
Lead Channel Setting Pacing Pulse Width: 0.4 ms
Lead Channel Setting Sensing Sensitivity: 5.6 mV
MDC IDC LEAD IMPLANT DT: 20000718
MDC IDC LEAD IMPLANT DT: 20000718
MDC IDC LEAD LOCATION: 753859
MDC IDC MSMT BATTERY REMAINING LONGEVITY: 108 mo
MDC IDC MSMT LEADCHNL RA IMPEDANCE VALUE: 67 Ohm
MDC IDC MSMT LEADCHNL RV IMPEDANCE VALUE: 502 Ohm
MDC IDC MSMT LEADCHNL RV PACING THRESHOLD AMPLITUDE: 1 V
MDC IDC MSMT LEADCHNL RV PACING THRESHOLD PULSEWIDTH: 0.4 ms
MDC IDC PG IMPLANT DT: 20140217

## 2016-11-23 ENCOUNTER — Ambulatory Visit (INDEPENDENT_AMBULATORY_CARE_PROVIDER_SITE_OTHER): Payer: Medicare Other

## 2016-11-23 DIAGNOSIS — Z5181 Encounter for therapeutic drug level monitoring: Secondary | ICD-10-CM | POA: Diagnosis not present

## 2016-11-23 DIAGNOSIS — Z7901 Long term (current) use of anticoagulants: Secondary | ICD-10-CM

## 2016-11-23 DIAGNOSIS — I4891 Unspecified atrial fibrillation: Secondary | ICD-10-CM | POA: Diagnosis not present

## 2016-11-23 LAB — POCT INR: INR: 2.1

## 2016-11-28 ENCOUNTER — Ambulatory Visit: Payer: Medicare Other | Admitting: Oncology

## 2016-12-20 ENCOUNTER — Telehealth: Payer: Self-pay | Admitting: Cardiology

## 2016-12-20 ENCOUNTER — Ambulatory Visit (INDEPENDENT_AMBULATORY_CARE_PROVIDER_SITE_OTHER): Payer: Medicare Other | Admitting: *Deleted

## 2016-12-20 DIAGNOSIS — I442 Atrioventricular block, complete: Secondary | ICD-10-CM

## 2016-12-20 NOTE — Progress Notes (Signed)
Remote pacemaker transmission.   

## 2016-12-20 NOTE — Telephone Encounter (Signed)
LMOVM reminding pt to send remote transmission.   

## 2016-12-21 ENCOUNTER — Ambulatory Visit (INDEPENDENT_AMBULATORY_CARE_PROVIDER_SITE_OTHER): Payer: Medicare Other

## 2016-12-21 ENCOUNTER — Encounter: Payer: Self-pay | Admitting: Cardiology

## 2016-12-21 DIAGNOSIS — Z5181 Encounter for therapeutic drug level monitoring: Secondary | ICD-10-CM

## 2016-12-21 DIAGNOSIS — I4891 Unspecified atrial fibrillation: Secondary | ICD-10-CM

## 2016-12-21 DIAGNOSIS — Z7901 Long term (current) use of anticoagulants: Secondary | ICD-10-CM | POA: Diagnosis not present

## 2016-12-21 LAB — CUP PACEART REMOTE DEVICE CHECK
Battery Impedance: 328 Ohm
Battery Voltage: 2.79 V
Brady Statistic RV Percent Paced: 99 %
Date Time Interrogation Session: 20180220182123
Implantable Lead Implant Date: 20000718
Implantable Lead Location: 753860
Implantable Pulse Generator Implant Date: 20140217
Lead Channel Impedance Value: 497 Ohm
Lead Channel Setting Pacing Amplitude: 2.5 V
Lead Channel Setting Pacing Pulse Width: 0.4 ms
Lead Channel Setting Sensing Sensitivity: 5.6 mV
MDC IDC LEAD IMPLANT DT: 20000718
MDC IDC LEAD LOCATION: 753859
MDC IDC MSMT BATTERY REMAINING LONGEVITY: 108 mo
MDC IDC MSMT LEADCHNL RA IMPEDANCE VALUE: 67 Ohm

## 2016-12-21 LAB — POCT INR: INR: 2.2

## 2016-12-27 ENCOUNTER — Emergency Department: Payer: Medicare Other

## 2016-12-27 ENCOUNTER — Inpatient Hospital Stay
Admission: EM | Admit: 2016-12-27 | Discharge: 2016-12-31 | DRG: 281 | Disposition: A | Discharge status: Home / Self Care | Payer: Medicare Other | Attending: Internal Medicine | Admitting: Internal Medicine

## 2016-12-27 DIAGNOSIS — Z7901 Long term (current) use of anticoagulants: Secondary | ICD-10-CM

## 2016-12-27 DIAGNOSIS — I25110 Atherosclerotic heart disease of native coronary artery with unstable angina pectoris: Secondary | ICD-10-CM

## 2016-12-27 DIAGNOSIS — C8590 Non-Hodgkin lymphoma, unspecified, unspecified site: Secondary | ICD-10-CM | POA: Diagnosis present

## 2016-12-27 DIAGNOSIS — K869 Disease of pancreas, unspecified: Secondary | ICD-10-CM | POA: Diagnosis present

## 2016-12-27 DIAGNOSIS — R001 Bradycardia, unspecified: Secondary | ICD-10-CM | POA: Diagnosis present

## 2016-12-27 DIAGNOSIS — Z90710 Acquired absence of both cervix and uterus: Secondary | ICD-10-CM

## 2016-12-27 DIAGNOSIS — K219 Gastro-esophageal reflux disease without esophagitis: Secondary | ICD-10-CM | POA: Diagnosis present

## 2016-12-27 DIAGNOSIS — E782 Mixed hyperlipidemia: Secondary | ICD-10-CM | POA: Diagnosis not present

## 2016-12-27 DIAGNOSIS — Z7189 Other specified counseling: Secondary | ICD-10-CM | POA: Diagnosis not present

## 2016-12-27 DIAGNOSIS — Z87891 Personal history of nicotine dependence: Secondary | ICD-10-CM | POA: Diagnosis not present

## 2016-12-27 DIAGNOSIS — Z7951 Long term (current) use of inhaled steroids: Secondary | ICD-10-CM

## 2016-12-27 DIAGNOSIS — Z66 Do not resuscitate: Secondary | ICD-10-CM | POA: Diagnosis present

## 2016-12-27 DIAGNOSIS — I10 Essential (primary) hypertension: Secondary | ICD-10-CM

## 2016-12-27 DIAGNOSIS — M1990 Unspecified osteoarthritis, unspecified site: Secondary | ICD-10-CM | POA: Diagnosis present

## 2016-12-27 DIAGNOSIS — A0471 Enterocolitis due to Clostridium difficile, recurrent: Secondary | ICD-10-CM | POA: Diagnosis present

## 2016-12-27 DIAGNOSIS — I482 Chronic atrial fibrillation: Secondary | ICD-10-CM

## 2016-12-27 DIAGNOSIS — R079 Chest pain, unspecified: Secondary | ICD-10-CM

## 2016-12-27 DIAGNOSIS — Z79899 Other long term (current) drug therapy: Secondary | ICD-10-CM | POA: Diagnosis not present

## 2016-12-27 DIAGNOSIS — K8689 Other specified diseases of pancreas: Secondary | ICD-10-CM

## 2016-12-27 DIAGNOSIS — Z880 Allergy status to penicillin: Secondary | ICD-10-CM

## 2016-12-27 DIAGNOSIS — Z950 Presence of cardiac pacemaker: Secondary | ICD-10-CM

## 2016-12-27 DIAGNOSIS — Z888 Allergy status to other drugs, medicaments and biological substances status: Secondary | ICD-10-CM

## 2016-12-27 DIAGNOSIS — I214 Non-ST elevation (NSTEMI) myocardial infarction: Secondary | ICD-10-CM | POA: Diagnosis not present

## 2016-12-27 DIAGNOSIS — Z809 Family history of malignant neoplasm, unspecified: Secondary | ICD-10-CM

## 2016-12-27 LAB — CBC WITH DIFFERENTIAL/PLATELET
BASOS ABS: 0.1 10*3/uL (ref 0–0.1)
BASOS PCT: 1 %
EOS ABS: 0 10*3/uL (ref 0–0.7)
EOS PCT: 0 %
HCT: 43.2 % (ref 35.0–47.0)
Hemoglobin: 14.7 g/dL (ref 12.0–16.0)
Lymphocytes Relative: 15 %
Lymphs Abs: 1.8 10*3/uL (ref 1.0–3.6)
MCH: 31.5 pg (ref 26.0–34.0)
MCHC: 34 g/dL (ref 32.0–36.0)
MCV: 92.7 fL (ref 80.0–100.0)
MONO ABS: 0.8 10*3/uL (ref 0.2–0.9)
Monocytes Relative: 7 %
Neutro Abs: 9.2 10*3/uL — ABNORMAL HIGH (ref 1.4–6.5)
Neutrophils Relative %: 77 %
PLATELETS: 151 10*3/uL (ref 150–440)
RBC: 4.66 MIL/uL (ref 3.80–5.20)
RDW: 13.6 % (ref 11.5–14.5)
WBC: 12 10*3/uL — ABNORMAL HIGH (ref 3.6–11.0)

## 2016-12-27 LAB — TROPONIN I
Troponin I: <0.03 ng/mL (ref <0.03)
Troponin I: 13.73 ng/mL (ref <0.03)

## 2016-12-27 LAB — URINALYSIS, COMPLETE (UACMP) WITH MICROSCOPIC
BACTERIA UA: NONE SEEN
BILIRUBIN URINE: NEGATIVE
Glucose, UA: NEGATIVE mg/dL
Hgb urine dipstick: NEGATIVE
KETONES UR: NEGATIVE mg/dL
LEUKOCYTES UA: NEGATIVE
Nitrite: NEGATIVE
PROTEIN: NEGATIVE mg/dL
RBC / HPF: NONE SEEN RBC/hpf (ref 0–5)
Specific Gravity, Urine: 1.011 (ref 1.005–1.030)
WBC UA: NONE SEEN WBC/hpf (ref 0–5)
pH: 7 (ref 5.0–8.0)

## 2016-12-27 LAB — LIPASE, BLOOD: LIPASE: 34 U/L (ref 11–51)

## 2016-12-27 LAB — COMPREHENSIVE METABOLIC PANEL
ALK PHOS: 94 U/L (ref 38–126)
ALT: 37 U/L (ref 14–54)
ANION GAP: 6 (ref 5–15)
AST: 57 U/L — ABNORMAL HIGH (ref 15–41)
Albumin: 3.9 g/dL (ref 3.5–5.0)
BILIRUBIN TOTAL: 1.3 mg/dL — AB (ref 0.3–1.2)
BUN: 16 mg/dL (ref 6–20)
CALCIUM: 9.2 mg/dL (ref 8.9–10.3)
CO2: 27 mmol/L (ref 22–32)
Chloride: 104 mmol/L (ref 101–111)
Creatinine, Ser: 0.8 mg/dL (ref 0.44–1.00)
GFR calc Af Amer: >60 mL/min (ref >60)
GFR calc non Af Amer: >60 mL/min (ref >60)
GLUCOSE: 172 mg/dL — AB (ref 65–99)
Potassium: 5.1 mmol/L (ref 3.5–5.1)
Sodium: 137 mmol/L (ref 135–145)
TOTAL PROTEIN: 6.7 g/dL (ref 6.5–8.1)

## 2016-12-27 LAB — PROTIME-INR
INR: 2.23
Prothrombin Time: 25.1 seconds — ABNORMAL HIGH (ref 11.4–15.2)

## 2016-12-27 LAB — GLUCOSE, CAPILLARY: GLUCOSE-CAPILLARY: 176 mg/dL — AB (ref 65–99)

## 2016-12-27 LAB — LACTIC ACID, PLASMA: Lactic Acid, Venous: 1.4 mmol/L (ref 0.5–1.9)

## 2016-12-27 MED ORDER — VANCOMYCIN 50 MG/ML ORAL SOLUTION
250.0000 mg | Freq: Four times a day (QID) | ORAL | Status: DC
  Administered 2016-12-27 – 2016-12-31 (×7): 250 mg via ORAL
  Filled 2016-12-27 (×17): qty 5

## 2016-12-27 MED ORDER — TRAZODONE HCL 50 MG PO TABS: 25.0000 mg | ORAL_TABLET | Freq: Every evening | ORAL | Status: DC | PRN

## 2016-12-27 MED ORDER — SODIUM CHLORIDE 0.9 % IV BOLUS (SEPSIS)
500.0000 mL | Freq: Once | INTRAVENOUS | Status: AC
  Administered 2016-12-27: 500 mL via INTRAVENOUS

## 2016-12-27 MED ORDER — ALPRAZOLAM 0.5 MG PO TABS
0.5000 mg | ORAL_TABLET | Freq: Every evening | ORAL | Status: DC | PRN
  Administered 2016-12-28 – 2016-12-30 (×4): 0.5 mg via ORAL
  Filled 2016-12-27 (×4): qty 1

## 2016-12-27 MED ORDER — GABAPENTIN 400 MG PO CAPS
400.0000 mg | ORAL_CAPSULE | Freq: Every day | ORAL | Status: DC
  Administered 2016-12-27 – 2016-12-30 (×4): 400 mg via ORAL
  Filled 2016-12-27 (×4): qty 1

## 2016-12-27 MED ORDER — BISACODYL 5 MG PO TBEC
5.0000 mg | DELAYED_RELEASE_TABLET | Freq: Every day | ORAL | Status: DC | PRN
  Filled 2016-12-27: qty 1

## 2016-12-27 MED ORDER — IOPAMIDOL (ISOVUE-300) INJECTION 61%
30.0000 mL | Freq: Once | INTRAVENOUS | Status: AC | PRN
  Administered 2016-12-27: 30 mL via ORAL

## 2016-12-27 MED ORDER — ACETAMINOPHEN 650 MG RE SUPP: 650.0000 mg | Freq: Four times a day (QID) | RECTAL | Status: DC | PRN

## 2016-12-27 MED ORDER — AMLODIPINE BESYLATE 5 MG PO TABS: 5.0000 mg | ORAL_TABLET | Freq: Every day | ORAL | Status: DC

## 2016-12-27 MED ORDER — ONDANSETRON HCL 4 MG/2ML IJ SOLN: 4.0000 mg | Freq: Four times a day (QID) | INTRAMUSCULAR | Status: DC | PRN

## 2016-12-27 MED ORDER — HEPARIN BOLUS VIA INFUSION
3200.0000 [IU] | Freq: Once | INTRAVENOUS | Status: AC
  Administered 2016-12-27: 3200 [IU] via INTRAVENOUS
  Filled 2016-12-27: qty 3200

## 2016-12-27 MED ORDER — ACETAMINOPHEN 325 MG PO TABS: 650.0000 mg | ORAL_TABLET | Freq: Four times a day (QID) | ORAL | Status: DC | PRN

## 2016-12-27 MED ORDER — FENTANYL CITRATE (PF) 100 MCG/2ML IJ SOLN
25.0000 ug | Freq: Once | INTRAMUSCULAR | Status: AC
  Administered 2016-12-27: 25 ug via INTRAVENOUS
  Filled 2016-12-27: qty 2

## 2016-12-27 MED ORDER — FOLIC ACID 1 MG PO TABS
1.0000 mg | ORAL_TABLET | Freq: Every day | ORAL | Status: DC
  Administered 2016-12-27 – 2016-12-31 (×5): 1 mg via ORAL
  Filled 2016-12-27 (×5): qty 1

## 2016-12-27 MED ORDER — DOCUSATE SODIUM 100 MG PO CAPS
100.0000 mg | ORAL_CAPSULE | Freq: Two times a day (BID) | ORAL | Status: DC
  Administered 2016-12-27 – 2016-12-30 (×3): 100 mg via ORAL
  Filled 2016-12-27 (×7): qty 1

## 2016-12-27 MED ORDER — ASPIRIN 81 MG PO CHEW
CHEWABLE_TABLET | ORAL | Status: AC
  Filled 2016-12-27: qty 4

## 2016-12-27 MED ORDER — ONDANSETRON HCL 4 MG PO TABS: 4.0000 mg | ORAL_TABLET | Freq: Four times a day (QID) | ORAL | Status: DC | PRN

## 2016-12-27 MED ORDER — IOPAMIDOL (ISOVUE-300) INJECTION 61%
75.0000 mL | Freq: Once | INTRAVENOUS | Status: AC | PRN
  Administered 2016-12-27: 75 mL via INTRAVENOUS

## 2016-12-27 MED ORDER — HEPARIN (PORCINE) IN NACL 100-0.45 UNIT/ML-% IJ SOLN
900.0000 [IU]/h | INTRAMUSCULAR | Status: DC
  Administered 2016-12-27: 650 [IU]/h via INTRAVENOUS
  Administered 2016-12-30: 900 [IU]/h via INTRAVENOUS
  Filled 2016-12-27 (×3): qty 250

## 2016-12-27 MED ORDER — NITROGLYCERIN IN D5W 200-5 MCG/ML-% IV SOLN
0.0000 ug/min | Freq: Once | INTRAVENOUS | Status: AC
  Administered 2016-12-27: 5 ug/min via INTRAVENOUS
  Filled 2016-12-27: qty 250

## 2016-12-27 MED ORDER — GABAPENTIN 100 MG PO CAPS: 100.0000 mg | ORAL_CAPSULE | Freq: Two times a day (BID) | ORAL | Status: DC

## 2016-12-27 NOTE — ED Notes (Signed)
ICU called about room, stated that nurse was busy and unable to take report.  Nurse and staff in code

## 2016-12-27 NOTE — ED Notes (Signed)
Pt sts CP decr, but still present.

## 2016-12-27 NOTE — H&P (Signed)
[facility] NAME: [name]    MR#:  [id number]  DATE OF BIRTH:  31-Mar-1928  DATE OF ADMISSION:  12/27/2016  PRIMARY CARE PHYSICIAN: Tama High III, MD   REQUESTING/REFERRING PHYSICIAN: McShane  CHIEF COMPLAINT: 83 year old with chest pain    Chief Complaint  Patient presents with  . Chest Pain    HISTORY OF PRESENT ILLNESS:  [name]  is a 83 y.o. 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 she initially thought it was related to gas. First  troponins are negative but the second one went up to 13. Seen by cardiology from [facility] cardiology Dr. and, patient will be admitted to intensive care unit for nitro drip, heparin drip. According to  cardiology, cardiaccatheter won't be done unless she revokes DO NOT RESUSCITATE, patient does not want to above the DO NOT RESUSCITATE.[name]  PAST MEDICAL HISTORY:   Past Medical History:  Diagnosis Date  . Atrial fibrillation ([place]) 07/20/2009   Qualifier: Diagnosis of  By: Caryl Comes, MD, Leonidas Romberg Mack Guise   . C. difficile colitis    feces transplant  . Complete heart block  s/p av ablation    with atrial fibrillation. Pt is s/p AV nodal ablation due to permanent and symptomatic atrial fibrillation.   . Coronary artery disease    Nonobstructive  . Diverticulitis   . GERD (gastroesophageal reflux disease)   . Hereditary hemochromatosis ([place])    Phlebotomized in the past, not getting phlebotomy  now  . Hyperlipidemia   . Hypertension   . Non Hodgkin's lymphoma ([place])    stable; followed by an oncologist  . Osteoarthritis   . 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 Pacemaker-dual-chamber-[employer]tronic 04/04/2011    PAST SURGICAL HISTOIRY:   Past Surgical History:  Procedure Laterality Date  . ABDOMINAL HYSTERECTOMY    . ATRIAL ABLATION SURGERY  2008   AV nodal ablation  . BREAST BIOPSY  benign  . CARDIAC CATHETERIZATION    . CARDIOVERSION    . CHOLECYSTECTOMY    . COLONOSCOPY  oct.12, 2012  . INSERT / REPLACE / REMOVE PACEMAKER    . PACEMAKER GENERATOR CHANGE N/A 12/17/2012   Procedure: PACEMAKER GENERATOR CHANGE;  Surgeon: Deboraha Sprang, MD;  Location[facility] CATH LAB;  Service: Cardiovascular;  Laterality: N/A;  . stool transplant    . US ECHOCARDIOGRAPHY  07/2008   EF 55%, mild LVH. No significant valvular abnormalities    SOCIAL HISTORY:   Social History  Substance Use Topics  . Smoking status: Former Smoker    Quit date: 1996  . Smokeless tobacco: Never Used  . Alcohol use No    FAMILY HISTORY:   Family History  Problem Relation Age of Onset  . Liver disease Father   . Cancer Mother   . Prostate cancer Neg Hx   . Kidney disease Neg Hx   . Kidney cancer Neg Hx   . Breast cancer Neg Hx   . Ovarian cancer Neg Hx   . Colon cancer Neg Hx  DRUG ALLERGIES:   Allergies  Allergen Reactions  . Amoxicillin   . Cefdinir Diarrhea  . Cilostazol     Other reaction(s): Headache  . Levofloxacin   . Plavix [Clopidogrel Bisulfate]     12/12/11 patient reports she has never taken Plavix before.   . Sulfur Nausea Only    REVIEW OF SYSTEMS:  CONSTITUTIONAL: No fever, fatigue or weakness.  EYES: No blurred or double vision.  EARS, NOSE, AND THROAT: No tinnitus or ear pain.  RESPIRATORY: No cough, shortness of breath, wheezing or hemoptysis.  CARDIOVASCULAR:  chest pain, orthopnea, edema.  GASTROINTESTINAL: No nausea, vomiting, diarrhea or abdominal pain.   GENITOURINARY: No dysuria, hematuria.  ENDOCRINE: No polyuria, nocturia,  HEMATOLOGY: No anemia, easy bruising or bleeding SKIN: No rash or lesion. MUSCULOSKELETAL: No joint pain or arthritis.   NEUROLOGIC: No tingling, numbness, weakness.  PSYCHIATRY: No anxiety or depression.   MEDICATIONS AT HOME:   Prior to Admission medications   Medication Sig Start Date End Date Taking? Authorizing Provider  ALPRAZolam Duanne Moron) 0.25 MG tablet Take 0.5 mg by mouth at bedtime as needed for sleep.   Yes Historical Provider, MD  amLODipine (NORVASC) 5 MG tablet TAKE ONE TABLET BY MOUTH ONCE DAILY 01/04/16  Yes Historical Provider, MD  Cetirizine HCl (ZYRTEC ALLERGY PO) Take by mouth daily.    Yes Historical Provider, MD  fluticasone (FLONASE) 50 MCG/ACT nasal spray Place 2 sprays into the nose daily as needed for allergies.    Yes Historical Provider, MD  folic acid (FOLVITE) 1 MG tablet Take 1 mg by mouth daily.     Yes Historical Provider, MD  gabapentin (NEURONTIN) 400 MG capsule Take 400 mg by mouth at bedtime.    Yes Historical Provider, MD  meclizine (ANTIVERT) 25 MG tablet Take 1 tablet (25 mg total) by mouth 3 (three) times daily as needed. 09/01/16  Yes Theodoro Grist, MD  polyvinyl alcohol (LIQUIFILM TEARS) 1.4 % ophthalmic solution Place 2 drops into both eyes 3 (three) times daily as needed (for dry eyes).   Yes Historical Provider, MD  vancomycin (VANCOCIN) 50 mg/mL oral solution Take 5 mLs (250 mg total) by mouth every 6 (six) hours. Patient taking differently: Take 250 mg by mouth every 6 (six) hours. Qid every 3 days 09/01/16  Yes Theodoro Grist, MD  warfarin (COUMADIN) 2 MG tablet TAKE ONE TABLET BY MOUTH ONCE DAILY AS DIRECTED Patient taking differently: TAKE ONE TABLET BY MOUTH ONCE DAILY 09/05/16  Yes Minna Merritts, MD  gabapentin (NEURONTIN) 100 MG capsule Take 100 mg by mouth. Takes 1 tablet in the am daily.    Historical Provider, MD  warfarin (COUMADIN) 3 MG tablet Take as directed by  coumadin clinic Patient not taking: Reported on 12/27/2016 05/04/16   Minna Merritts, MD      VITAL SIGNS:  Blood pressure 133/63, pulse (!) 59, temperature 98.2 F (36.8 C), temperature source Oral, resp. rate 15, height 5\' 2"  (1.575 m), weight 54.4 kg (120 lb), SpO2 90 %.  PHYSICAL EXAMINATION:  GENERAL:  83 y.o.-year-old patient lying in the bed with no acute distress.  EYES: Pupils equal, round, reactive to light and accommodation. No scleral icterus. Extraocular muscles intact.  HEENT: Head atraumatic, normocephalic. Oropharynx and nasopharynx clear.  NECK:  Supple, no jugular venous distention. No thyroid enlargement, no tenderness.  LUNGS: Normal breath sounds bilaterally, no wheezing, rales,rhonchi or crepitation. No use of accessory muscles of respiration.  CARDIOVASCULAR: S1, S2 normal. No murmurs, rubs, or gallops.  ABDOMEN: Soft, nontender, nondistended. Bowel sounds present. No organomegaly or mass.  EXTREMITIES: No pedal edema, cyanosis, or clubbing.  NEUROLOGIC: Cranial nerves II through XII are intact. Muscle strength 5/5 in all extremities. Sensation intact. Gait not checked.  PSYCHIATRIC: The patient is alert and oriented x 3.  SKIN: No obvious rash, lesion, or ulcer.   LABORATORY PANEL:   CBC  Recent Labs Lab 12/27/16 1313  WBC 12.0*  HGB 14.7  HCT 43.2  PLT 151   ------------------------------------------------------------------------------------------------------------------  Chemistries   Recent Labs Lab 12/27/16 1217  NA 137  K 5.1  CL 104  CO2 27  GLUCOSE 172*  BUN 16  CREATININE 0.80  CALCIUM 9.2  AST 57*  ALT 37  ALKPHOS 94  BILITOT 1.3*   ------------------------------------------------------------------------------------------------------------------  Cardiac Enzymes  Recent Labs Lab 12/27/16 1615  TROPONINI 13.73*    ------------------------------------------------------------------------------------------------------------------  RADIOLOGY:  Ct Abdomen Pelvis W Contrast  Result Date: 12/27/2016 CLINICAL DATA:  Epigastric and upper abdominal pain for 1 week. Decreased appetite. EXAM: CT ABDOMEN AND PELVIS WITH CONTRAST TECHNIQUE: Multidetector CT imaging of the abdomen and pelvis was performed using the standard protocol following bolus administration of intravenous contrast. CONTRAST:  50mL ISOVUE-300 IOPAMIDOL (ISOVUE-300) INJECTION 61% COMPARISON:  CT scan 06/05/2014 FINDINGS: Lower chest: The lung bases demonstrate significant breathing motion artifact. There are bibasilar scarring changes but no worrisome lesions or pleural effusion. The heart is enlarged. Three-vessel coronary artery calcifications are noted along with aortic calcifications. The distal esophagus is grossly normal. Small hiatal hernia. Hepatobiliary: No focal hepatic lesions are identified. The liver contour is irregular suggesting changes of cirrhosis. No focal hepatic lesions or intrahepatic biliary dilatation. The gallbladder is surgically absent. No common bile duct dilatation. Pancreas: Enlarging cystic lesion in the Ing pancreatic head measuring 23 x 21 mm. This measured a maximum of 9 mm on the prior study. It is adjacent to the common bile duct but I do not think it is associated with a duct. It has benign imaging features. There is no nodularity or enhancement. I would recommend MRI abdomen/ MRCP without and with contrast for further evaluation. The pancreatic duct is normal. Spleen: Normal size.  No focal lesions. Adrenals/Urinary Tract: The adrenal glands and kidneys are unremarkable. There is a small angiomyolipoma associated with the midpole lower pole junction region of the left kidney. This measures 6 mm and is unchanged. No worrisome renal lesions or hydronephrosis. No obstructing ureteral calculi or bladder calculi. No bladder  mass. Stomach/Bowel: Distended elongated stomach but no lesions or acute inflammation. The duodenum, small bowel and colon are unremarkable. No inflammatory changes, mass lesions or obstructive findings. Moderate sigmoid diverticulosis but no findings for acute diverticulitis. The terminal ileum is normal. The appendix is not identified for certain. Vascular/Lymphatic: Advanced atherosclerotic calcifications involving the aorta and branch vessels. No aneurysm or dissection. The major venous structures are patent. No mesenteric or retroperitoneal mass or lymphadenopathy. Reproductive: Surgically absent. Other: There is an enlarged, 27 x 17 mm left external iliac lymph node on the left side. This was not present on the prior CT scan. I do not see any other lymphadenopathy typical along with at. There is a single right-sided inguinal lymph node which measures 10 mm which is likely normal. Musculoskeletal: No significant bony findings. Stable degenerative changes involving the spine along with osteoporosis. Chondrocalcinosis noted at the pubic symphysis. Benign-appearing sclerotic lesion in the left inferior pubic ramus is likely a bone island. IMPRESSION: 1. No acute abdominal findings or mass lesions. 2.  Enlarging cystic lesion in the pancreatic head. Recommend MRI abdomen/MRCP without with contrast for further evaluation. 3. Enlarged left external iliac lymph node. Given its size biopsy may be indicated. No other lymphadenopathy is demonstrated. 4. Mild cirrhotic changes involving the liver. 5. Advanced atherosclerotic calcifications involving the aorta and branch vessels and also the coronary arteries. Electronically Signed   By: Marijo Sanes M.D.   On: 12/27/2016 15:06   Dg Chest Port 1 View  Result Date: 12/27/2016 CLINICAL DATA:  One week of mid chest pain with increased symptoms over the past 2 days. Also diarrhea. History of non-Hodgkin's lymphoma, hemochromatosis, coronary artery did disease and heart  block, C. difficile colitis with feces transplant. Former smoker. EXAM: PORTABLE CHEST 1 VIEW COMPARISON:  Chest x-ray of January 27, 2013 FINDINGS: The lungs remain well-expanded. The interstitial markings are mildly prominent though stable. There is no alveolar infiltrate, pleural effusion, or pneumothorax. The heart is top-normal in size. The pulmonary vascularity is normal. The permanent pacemaker is in stable position. There is calcification in the wall of the aortic arch. The observed bony thorax exhibits no acute abnormality. IMPRESSION: COPD.  No pneumonia nor CHF nor other acute cardiopulmonary disease. Thoracic aortic atherosclerosis. Electronically Signed   By: David  Martinique M.D.   On: 12/27/2016 12:45    EKG:   Orders placed or performed during the hospital encounter of 12/27/16  . ED EKG  . ED EKG  Atrial flutter with 63 bpm, ventricular paced rhythm,  IMPRESSION AND PLAN:   83 year old female patient with multiple medical problems of essential hypertension, atrial fibrillation, recurrent C. difficile colitis having chest pain and found to have elevated troponins with non-ST elevation MI  #1 chest pain with non-ST elevation MI: Admit to intensive care unit stepdown status, started on nitroglycerin, heparin drip, continue aspirin, audiology consult, seen by Dr. END from [facility] cardiology, hold her Coumadin, CODE STATUS is DO NOT RESUSCITATE, patient not able to revoke the DO NOT RESUSCITATE so cardiac catheter cannot be done.  #2. recurrent C. difficile: Patient on suppressive therapy with vancomycin, follows up with Dr. Vira Agar, continue vancomycin 250 mg every 6 hours 3/ enlarging pancreas pancreatic mass, patient has 23 into 21 mm cystic lesion in the pancreatic head. Can follow-up with GI as an outpatient and does not want any workup at this time.  All the records are reviewed and case discussed with ED provider. Management plans discussed with the patient, family and they are  in agreement.  CODE STATUS: DNR  TOTAL TIME TAKING CARE OF THIS PATIENT: 55 minutes.    Epifanio Lesches M.D on 12/27/2016 at 6:13 PM  Between 7am to 6pm - Pager - [phone]  After 6pm go to [email or web address] - password [email or web address] [facility]  Office  [phone]  CC: Primary care physician; Adin Hector, MD  Note: This dictation was prepared with Dragon dictation along with smaller phrase technology. Any transcriptional errors that result from this process are unintentional.

## 2016-12-27 NOTE — Consult Note (Signed)
Cardiology Consultation Note    Patient ID: [name], MRN: [id number], DOB/AGE: 03/12/1928 83 y.o. Admit date: 12/27/2016   Date of Consult: 12/27/2016 Primary Physician: Adin Hector, MD Primary Cardiologist: Esmond Plants, MD EP: Jolyn Nap, MD  Chief Complaint: Chest pain Reason for Consultation: Elevated troponin Requesting MD: Charlotte Crumb, MD  HPI: TERSA Snyder is a 83 y.o. female with history of permanent atrial fibrillation on warfarin s/p AV node ablation an pacemaker placement, non-obstructive CAD, hypertension, hyperlipidemia, recurrent C. difficile colitis, non-hodgkin's lymphoma, hereditary hemochromatosis, and pancreatic mass, whom I have been asked to evaluate due to elevated troponin. The patient reports intermittent chest pain over last 1-1.5 weeks, which she describes as an ache. It is non-exertional and associated with intermittent shortness of breath. This morning around 9:30-10, she had acute onset of sharp, substernal chest pain radiating to the back (max intensity 8/10). The pain lasted several minutes, prompting the patient to summon help via [place]. She received SL NTG via EMS as well as IV fentanyl in the ED with marked improvement in her chest pain. She now complains only of an intermittent "ache" across her chest (1/10 in intensity).  In the ED, the patient was initially worked up for an abdominal etiology of her pain, given epigastric tenderness on exam and history of pancreatic mass.  CT abdomen/pelvis showed slight interval enlargement of pancreatic mass but otherwise no acute abnormalities. Initial troponin was negative but repeat was significant elevated at 13.7, prompting cardiology consultation.  Past Medical History:  Diagnosis Date  . Atrial fibrillation ([place]) 07/20/2009   Qualifier: Diagnosis of  By: Caryl Comes, MD, Leonidas Romberg Mack Guise   . C. difficile colitis    feces transplant  . Complete heart block  s/p av ablation    with atrial  fibrillation. Pt is s/p AV nodal ablation due to permanent and symptomatic atrial fibrillation.   . Coronary artery disease    Nonobstructive  . Diverticulitis   . GERD (gastroesophageal reflux disease)   . Hereditary hemochromatosis ([place])    Phlebotomized in the past, not getting phlebotomy now  . Hyperlipidemia   . Hypertension   . Non Hodgkin's lymphoma ([place])    stable; followed by an oncologist  . Osteoarthritis   . 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 Pacemaker-dual-chamber-Medtronic 04/04/2011      Surgical History:  Past Surgical History:  Procedure Laterality Date  . ABDOMINAL HYSTERECTOMY    . ATRIAL ABLATION SURGERY  2008   AV nodal ablation  . BREAST BIOPSY  benign  . CARDIAC CATHETERIZATION    . CARDIOVERSION    . CHOLECYSTECTOMY    . COLONOSCOPY  oct.12, 2012  . INSERT / REPLACE / REMOVE PACEMAKER    . PACEMAKER GENERATOR CHANGE N/A 12/17/2012   Procedure: PACEMAKER GENERATOR CHANGE;  Surgeon: Deboraha Sprang, MD;  Location: [facility] CATH LAB;  Service: Cardiovascular;  Laterality: N/A;  . stool transplant    . US ECHOCARDIOGRAPHY  07/2008   EF 55%, mild LVH. No significant valvular abnormalities     Home Meds: Prior to Admission medications   Medication Sig Start Date [name] Date Taking? Authorizing Provider  ALPRAZolam Duanne Moron) 0.25 MG tablet Take 0.5 mg by mouth at bedtime as needed for sleep.   Yes Historical Provider, MD  amLODipine (NORVASC) 5 MG tablet TAKE ONE TABLET BY MOUTH ONCE DAILY 01/04/16  Yes Historical Provider, MD  Cetirizine HCl (ZYRTEC ALLERGY PO) Take by mouth daily.    Yes Historical Provider, MD  fluticasone (FLONASE) 50 MCG/ACT nasal spray Place 2 sprays into the nose daily as needed for allergies.    Yes Historical Provider, MD  folic acid (FOLVITE) 1 MG tablet Take 1 mg by mouth daily.     Yes Historical Provider, MD  gabapentin (NEURONTIN) 400 MG capsule Take 400 mg by mouth at bedtime.    Yes Historical Provider, MD  meclizine (ANTIVERT) 25 MG tablet  Take 1 tablet (25 mg total) by mouth 3 (three) times daily as needed. 09/01/16  Yes Theodoro Grist, MD  polyvinyl alcohol (LIQUIFILM TEARS) 1.4 % ophthalmic solution Place 2 drops into both eyes 3 (three) times daily as needed (for dry eyes).   Yes Historical Provider, MD  vancomycin (VANCOCIN) 50 mg/mL oral solution Take 5 mLs (250 mg total) by mouth every 6 (six) hours. Patient taking differently: Take 250 mg by mouth every 6 (six) hours. Qid every 3 days 09/01/16  Yes Theodoro Grist, MD  warfarin (COUMADIN) 2 MG tablet TAKE ONE TABLET BY MOUTH ONCE DAILY AS DIRECTED Patient taking differently: TAKE ONE TABLET BY MOUTH ONCE DAILY 09/05/16  Yes Minna Merritts, MD  gabapentin (NEURONTIN) 100 MG capsule Take 100 mg by mouth. Takes 1 tablet in the am daily.    Historical Provider, MD  warfarin (COUMADIN) 3 MG tablet Take as directed by coumadin clinic Patient not taking: Reported on 12/27/2016 05/04/16   Minna Merritts, MD    Inpatient Medications:    Allergies:  Allergies  Allergen Reactions  . Amoxicillin   . Cefdinir Diarrhea  . Cilostazol     Other reaction(s): Headache  . Levofloxacin   . Plavix [Clopidogrel Bisulfate]     12/12/11 patient reports she has never taken Plavix before.   . Sulfur Nausea Only    Social History   Social History  . Marital status: Widowed    Spouse name: N/A  . Number of children: N/A  . Years of education: N/A   Occupational History  . Retired Retired   Social History Main Topics  . Smoking status: Former Smoker    Quit date: 1996  . Smokeless tobacco: Never Used  . Alcohol use No  . Drug use: No  . Sexual activity: Yes    Birth control/ protection: Surgical   Other Topics Concern  . Not on file   Social History Narrative  . No narrative on file     Family History  Problem Relation Age of Onset  . Liver disease Father   . Cancer Mother   . Prostate cancer Neg Hx   . Kidney disease Neg Hx   . Kidney cancer Neg Hx   . Breast cancer  Neg Hx   . Ovarian cancer Neg Hx   . Colon cancer Neg Hx      Review of Systems: A 12-system review of systems was performed and is negative except as noted in the HPI.  Labs:  Recent Labs  12/27/16 1217 12/27/16 1615  TROPONINI <0.03 13.73*   Lab Results  Component Value Date   WBC 12.0 (H) 12/27/2016   HGB 14.7 12/27/2016   HCT 43.2 12/27/2016   MCV 92.7 12/27/2016   PLT 151 12/27/2016    Recent Labs Lab 12/27/16 1217  NA 137  K 5.1  CL 104  CO2 27  BUN 16  CREATININE 0.80  CALCIUM 9.2  PROT 6.7  BILITOT 1.3*  ALKPHOS 94  ALT 37  AST 57*  GLUCOSE 172*   Lab Results  Component Value Date   CHOL 196 08/25/2011   HDL 34 (L) 08/25/2011   LDLCALC 90 08/25/2011   TRIG 361 (H) 08/25/2011   No results found for: DDIMER  Radiology/Studies:  Ct Abdomen Pelvis W Contrast  Result Date: 12/27/2016 CLINICAL DATA:  Epigastric and upper abdominal pain for 1 week. Decreased appetite. EXAM: CT ABDOMEN AND PELVIS WITH CONTRAST TECHNIQUE: Multidetector CT imaging of the abdomen and pelvis was performed using the standard protocol following bolus administration of intravenous contrast. CONTRAST:  65mL ISOVUE-300 IOPAMIDOL (ISOVUE-300) INJECTION 61% COMPARISON:  CT scan 06/05/2014 FINDINGS: Lower chest: The lung bases demonstrate significant breathing motion artifact. There are bibasilar scarring changes but no worrisome lesions or pleural effusion. The heart is enlarged. Three-vessel coronary artery calcifications are noted along with aortic calcifications. The distal esophagus is grossly normal. Small hiatal hernia. Hepatobiliary: No focal hepatic lesions are identified. The liver contour is irregular suggesting changes of cirrhosis. No focal hepatic lesions or intrahepatic biliary dilatation. The gallbladder is surgically absent. No common bile duct dilatation. Pancreas: Enlarging cystic lesion in the Ing pancreatic head measuring 23 x 21 mm. This measured a maximum of 9 mm on the  prior study. It is adjacent to the common bile duct but I do not think it is associated with a duct. It has benign imaging features. There is no nodularity or enhancement. I would recommend MRI abdomen/ MRCP without and with contrast for further evaluation. The pancreatic duct is normal. Spleen: Normal size.  No focal lesions. Adrenals/Urinary Tract: The adrenal glands and kidneys are unremarkable. There is a small angiomyolipoma associated with the midpole lower pole junction region of the left kidney. This measures 6 mm and is unchanged. No worrisome renal lesions or hydronephrosis. No obstructing ureteral calculi or bladder calculi. No bladder mass. Stomach/Bowel: Distended elongated stomach but no lesions or acute inflammation. The duodenum, small bowel and colon are unremarkable. No inflammatory changes, mass lesions or obstructive findings. Moderate sigmoid diverticulosis but no findings for acute diverticulitis. The terminal ileum is normal. The appendix is not identified for certain. Vascular/Lymphatic: Advanced atherosclerotic calcifications involving the aorta and branch vessels. No aneurysm or dissection. The major venous structures are patent. No mesenteric or retroperitoneal mass or lymphadenopathy. Reproductive: Surgically absent. Other: There is an enlarged, 27 x 17 mm left external iliac lymph node on the left side. This was not present on the prior CT scan. I do not see any other lymphadenopathy typical along with at. There is a single right-sided inguinal lymph node which measures 10 mm which is likely normal. Musculoskeletal: No significant bony findings. Stable degenerative changes involving the spine along with osteoporosis. Chondrocalcinosis noted at the pubic symphysis. Benign-appearing sclerotic lesion in the left inferior pubic ramus is likely a bone island. IMPRESSION: 1. No acute abdominal findings or mass lesions. 2. Enlarging cystic lesion in the pancreatic head. Recommend MRI  abdomen/MRCP without with contrast for further evaluation. 3. Enlarged left external iliac lymph node. Given its size biopsy may be indicated. No other lymphadenopathy is demonstrated. 4. Mild cirrhotic changes involving the liver. 5. Advanced atherosclerotic calcifications involving the aorta and branch vessels and also the coronary arteries. Electronically Signed   By: Marijo Sanes M.D.   On: 12/27/2016 15:06   I have personally reviewed today's abdominal CT images. There is evidence of subendocardial hypoenhancement involving inferolateral wall consistent with acute MI.  Dg Chest Port 1 View  Result Date: 12/27/2016 CLINICAL DATA:  One week of mid chest pain with increased symptoms  over the past 2 days. Also diarrhea. History of non-Hodgkin's lymphoma, hemochromatosis, coronary artery did disease and heart block, C. difficile colitis with feces transplant. Former smoker. EXAM: PORTABLE CHEST 1 VIEW COMPARISON:  Chest x-ray of January 27, 2013 FINDINGS: The lungs remain well-expanded. The interstitial markings are mildly prominent though stable. There is no alveolar infiltrate, pleural effusion, or pneumothorax. The heart is top-normal in size. The pulmonary vascularity is normal. The permanent pacemaker is in stable position. There is calcification in the wall of the aortic arch. The observed bony thorax exhibits no acute abnormality. IMPRESSION: COPD.  No pneumonia nor CHF nor other acute cardiopulmonary disease. Thoracic aortic atherosclerosis. Electronically Signed   By: David  Martinique M.D.   On: 12/27/2016 12:45    Wt Readings from Last 3 Encounters:  12/27/16 120 lb (54.4 kg)  09/30/16 118 lb 8 oz (53.8 kg)  09/20/16 119 lb 8 oz (54.2 kg)    EKG: Atrial fibrillation with ventricular paced rhythm. There is ST depression in V2 and <5 mm discordant ST elevation in the inferior leads.  Physical Exam: Blood pressure 135/73, pulse (!) 59, temperature 98.2 F (36.8 C), temperature source Oral,  resp. rate 18, height 5\' 2"  (1.575 m), weight 120 lb (54.4 kg), SpO2 91 %. Body mass index is 21.95 kg/m. General: Well developed, well nourished, elderly woman in no acute distress. Her daughter [name] is at the bedside Head: Normocephalic, atraumatic, sclera non-icteric, no xanthomas, nares are without discharge.  Neck: Negative for carotid bruits. JVD not elevated. Lungs: Faint crackles at both bases. No wheezes. Normal work of brathing Heart: RRR with 2/6 systolic murmur. No rubs or gallops. Abdomen: Bowel sounds present. Soft with mild epigastric and LLQ tenderness without guarding. No hepatosplenomegaly. Msk:  Strength and tone appear normal for age. Extremities: No clubbing or cyanosis. No edema.  Distal pedal pulses are 2+ and equal bilaterally. Neuro: Alert and oriented X 3. No facial asymmetry. No focal deficit. Moves all extremities spontaneously. Psych:  Responds to questions appropriately with a normal affect.    Assessment and Plan  83 y/o woman with history of permanent atrial fibrillation on warfarin s/p AV node ablation an pacemaker placement, non-obstructive CAD, hypertension, hyperlipidemia, recurrent C. difficile colitis, non-hodgkin's lymphoma, hereditary hemochromatosis, and pancreatic mass, who presented after acute onset of chest pain this morning and significant rise in troponin.  Acute myocardia infarction Patient's history of stuttering chest pain for the last 1-1.5 weeks followed by acute worsening this morning is concerning acute MI. EKG is difficult to interpret due to ventricular pacing. Some changes compared with prior tracing are noted, though findings do not meet S[place] criteria. It is therefore impossible to classify this as an NSTEMI or STEMI. Review of the chest CT suggests acute ischemia/infarction in the LCx territory, though the entire heart is not included on the images. I have spoken with the patient and her daughter at length regarding these findings, as  well as medical therapy versus urgent cardiac catheterization and possible PCI. The patient is DNR/DNI and does not wish to rescind this for any procedures. In light of this and near complete resolution of chest pain, we have agreed to defer cardiac catheterization at this time.  Admit to stepdown on hospitalist service.  Though INR is therapeutic, recommend heparin infusion (ACS nomogram) x 48 hours given acute coronary syndrome.  Could consider clopidogrel for up to 12 months, though questionable allergy is noted in Epic; we will defer adding this while INR is therapeutic and  patient on heparin infusion.  Recommend NTG infusion, to be titrated for relief of chest pain as blood pressure allows.  Consider addition of low-dose beta-blocker, as blood pressure allows.  Hold warfarin in case patient changes her mind regarding cardiac catheterization.  Trend troponins until they have peaked, then stop.  Obtain transthoracic echocardiogram in the morning.  Check lipid panel and consider moderate-intensity statin (given advanced age).  Permanent atrial fibrillation Patient is status post AV node ablation and PPM placement. INR therapeutic today.  Hold warfarin in case invasive procedure is needed during this hospitalization.  Start heparin infusion for ACS and continue beyond 48 hours if INR is subtherapeutic.  Hypertension Normotensive at this time.  Recommend holding amlodipine in setting of acute MI to facilitate titration of NTG and introduction of beta-blocker, as tolerated.  Code status:  DNR/DNI - confirmed with patient and her daughter, [name].  Joyce Friends Tera Pellicane MD 12/27/2016, 5:42 PM Pager: [phone]

## 2016-12-27 NOTE — ED Notes (Signed)
Pt advise me that she has finished drinking her oral contrast. Nurse advised.

## 2016-12-27 NOTE — ED Notes (Signed)
ICU called about room, secretary stated that RN would call when able to take report

## 2016-12-27 NOTE — ED Triage Notes (Signed)
Pt arrives from home via [facility] for chest pain x 1 week. States saw cardiologist last Tuesday but pain worse x 2 days. Pt states diarrhea and is taking antibiotic every few days for that. Pt is alert and oriented, pale, EMS reports pt not at baseline and is lethargic. Pt has hx of a rib and incontinence d/t intestines pressing on bladder. Pt is ventricular paced with EMS with some ST elevation.

## 2016-12-27 NOTE — ED Notes (Addendum)
Pt assisted to and from bedside commode. Pt urinated and had BM. Pt back in bed at this time.

## 2016-12-27 NOTE — ED Notes (Signed)
Pt ambulated to BR

## 2016-12-27 NOTE — ED Notes (Addendum)
Pt reported 324 ASA given by EMS, MD [place] informed

## 2016-12-27 NOTE — Progress Notes (Signed)
ANTICOAGULATION CONSULT NOTE - Initial Consult  Pharmacy Consult for Heparin  Indication: chest pain/ACS  Allergies  Allergen Reactions  . Amoxicillin   . Cefdinir Diarrhea  . Cilostazol     Other reaction(s): Headache  . Levofloxacin   . Plavix [Clopidogrel Bisulfate]     12/12/11 patient reports she has never taken Plavix before.   . Sulfur Nausea Only    Patient Measurements: Height: 5\' 2"  (157.5 cm) Weight: 120 lb (54.4 kg) IBW/kg (Calculated) : 50.1 Heparin Dosing Weight: 54  Vital Signs: Temp: 98.2 F (36.8 C) (02/27 1206) Temp Source: Oral (02/27 1206) BP: 133/63 (02/27 1800) Pulse Rate: 59 (02/27 1800)  Labs:  Recent Labs  12/27/16 1217 12/27/16 1313 12/27/16 1615  HGB  --  14.7  --   HCT  --  43.2  --   PLT  --  151  --   LABPROT 25.1*  --   --   INR 2.23  --   --   CREATININE 0.80  --   --   TROPONINI <0.03  --  13.73*    Estimated Creatinine Clearance: 39.9 mL/min (by C-G formula based on SCr of 0.8 mg/dL).   Medical History: Past Medical History:  Diagnosis Date  . Atrial fibrillation ([place]) 07/20/2009   Qualifier: Diagnosis of  By: Caryl Comes, MD, Leonidas Romberg Mack Guise   . C. difficile colitis    feces transplant  . Complete heart block  s/p av ablation    with atrial fibrillation. Pt is s/p AV nodal ablation due to permanent and symptomatic atrial fibrillation.   . Coronary artery disease    Nonobstructive  . Diverticulitis   . GERD (gastroesophageal reflux disease)   . Hereditary hemochromatosis ([place])    Phlebotomized in the past, not getting phlebotomy now  . Hyperlipidemia   . Hypertension   . Non Hodgkin's lymphoma ([place])    stable; followed by an oncologist  . Osteoarthritis   . 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 Pacemaker-dual-chamber-Medtronic 04/04/2011    Medications:   (Not in a hospital admission) Scheduled:  . amLODipine  5 mg Oral Daily  . docusate sodium  100 mg Oral BID  . folic acid  1 mg Oral Daily  . gabapentin  100 mg  Oral BID  . gabapentin  400 mg Oral QHS  . heparin  3,200 Units Intravenous Once  . vancomycin  250 mg Oral Q6H    Assessment: Pharmacy consulted to dose and monitor heparin in this 83 year old woman being treated for ACS. Patient is also on warfarin as an outpatient.  Goal of Therapy:  Heparin level 0.3-0.7 units/ml Monitor platelets by anticoagulation protocol: Yes   Plan:  Give 3200 units bolus x 1 Start heparin infusion at 650 units/hr Check anti-Xa level in 8 hours and daily while on heparin Continue to monitor H&H and platelets    Phillippa Straub D 12/27/2016,6:18 PM

## 2016-12-27 NOTE — ED Notes (Signed)
Pt drinking oral contrast, denies nausea from it. States she will need to urinate soon, pt informed to hit call bell when she needs to urinate. Family at bedside.

## 2016-12-27 NOTE — ED Provider Notes (Addendum)
[facility] Emergency Department Provider Note  ____________________________________________   I have reviewed the triage vital signs and the nursing notes.   HISTORY  Chief Complaint Chest Pain    HPI [name] is a 83 y.o. female with multiple medical problems states that she has been having pain for the last week or so. She is very vague about when the pain is. She says his chest pain but she indicates her epigastric and upper stomach. She states that she's been having decreased appetite and the blood been giving her an sure to help her eat. She denies any fever. She denies any shortness of breath. She denies exertional symptoms. The pain has been uninterrupted since last week. Patient does have a history of C. difficile but is not having diarrhea she states. She has a history of atrial fibrillation with a pacemaker but she states she has never had any stents or heart attack. She does have a history of lymphoma. In short, patient here with very vaguely described pain. When I left the room she complained to the nurse it was really her back was hurting but she can't specify where.Level 5 chart caveat; no further history available due to patient status.     Past Medical History:  Diagnosis Date  . Atrial fibrillation ([place]) 07/20/2009   Qualifier: Diagnosis of  By: Caryl Comes, MD, Leonidas Romberg Mack Guise   . C. difficile colitis    feces transplant  . Complete heart block  s/p av ablation    with atrial fibrillation. Pt is s/p AV nodal ablation due to permanent and symptomatic atrial fibrillation.   . Coronary artery disease    Nonobstructive  . Diverticulitis   . GERD (gastroesophageal reflux disease)   . Hereditary hemochromatosis ([place])    Phlebotomized in the past, not getting phlebotomy now  . Hyperlipidemia   . Hypertension   . Non Hodgkin's lymphoma ([place])    stable; followed by an oncologist  . Osteoarthritis   . 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  Pacemaker-dual-chamber-Medtronic 04/04/2011    Patient Active Problem List   Diagnosis Date Noted  . Acute URI 09/29/2016  . Hypotension 09/01/2016  . Bradycardia 09/01/2016  . Hyponatremia 09/01/2016  . Hypokalemia 09/01/2016  . Leukocytosis 09/01/2016  . Meniere disease 09/01/2016  . Dehydration 09/01/2016  . Coagulopathy ([place]) 09/01/2016  . Pancreatic cyst 09/01/2016  . Clostridium difficile colitis 08/28/2016  . Urge incontinence of urine 07/19/2016  . Urinary incontinence without sensory awareness 07/19/2016  . Vaginal atrophy 07/19/2016  . Rectocele 07/19/2016  . Status post hysterectomy 07/19/2016  . Arthritis 04/13/2016  . Gastroesophageal reflux disease with hiatal hernia 04/13/2016  . Hyperlipidemia, unspecified 04/13/2016  . Neuropathy, peripheral ([place]) 04/13/2016  . Hereditary hemochromatosis ([place]) 11/26/2015  . Hyperglycemia, unspecified 11/26/2015  . Vitamin D deficiency 11/26/2015  . Osteoarthritis 09/08/2014  . Abnormal blood sugar 05/13/2014  . Contracture of palmar fascia (Dupuytren's) 04/15/2014  . Benign cystic mucinous tumor 04/15/2014  . Triggering of digit 04/15/2014  . Dupuytren's contracture of left hand 04/15/2014  . CLL (chronic lymphocytic leukemia) ([place]) 04/01/2014  . Calculous cholecystitis 02/05/2014  . Cervical pain 02/05/2014  . Chronic obstructive airway disease with asthma ([place]) 02/05/2014  . LBP (low back pain) 02/05/2014  . 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 Post cardiac operation functional disturbance 01/24/2014  . Encounter for therapeutic drug monitoring 11/27/2013  . Encounter for therapeutic drug level monitoring 11/27/2013  . Abdominal mass 06/20/2013  . Central retinal edema, cystoid 12/24/2012  .  Atrioventricular block, complete s/p AV ablation 12/11/2012  . Complete atrioventricular block ([place]) 12/11/2012  . H/O cataract extraction 10/08/2012  . Retinal telangiectasis 08/20/2012  . Pseudoaphakia 08/20/2012  . Pseudophakia of  both eyes 08/20/2012  . PVC (premature ventricular contraction) 08/14/2012  . Hypertension 05/09/2012  . Essential (primary) hypertension 05/09/2012  . Sleep-disordered breathing 04/04/2011  . Pacemaker-dual-chamber-Medtronic 04/04/2011  . Artificial cardiac pacemaker 04/04/2011  . Long term (current) use of anticoagulants 02/09/2011  . Long term current use of anticoagulant 02/09/2011  . HYPERLIPIDEMIA-MIXED 12/09/2009  . ATRIAL FIBRILLATION 07/20/2009    Past Surgical History:  Procedure Laterality Date  . ABDOMINAL HYSTERECTOMY    . ATRIAL ABLATION SURGERY  2008   AV nodal ablation  . BREAST BIOPSY  benign  . CARDIAC CATHETERIZATION    . CARDIOVERSION    . CHOLECYSTECTOMY    . COLONOSCOPY  oct.12, 2012  . INSERT / REPLACE / REMOVE PACEMAKER    . PACEMAKER GENERATOR CHANGE N/A 12/17/2012   Procedure: PACEMAKER GENERATOR CHANGE;  Surgeon: Deboraha Sprang, MD;  Location: [facility] CATH LAB;  Service: Cardiovascular;  Laterality: N/A;  . stool transplant    . US ECHOCARDIOGRAPHY  07/2008   EF 55%, mild LVH. No significant valvular abnormalities    Prior to Admission medications   Medication Sig Start Date End Date Taking? Authorizing Provider  ALPRAZolam Duanne Moron) 0.25 MG tablet Take 0.5 mg by mouth at bedtime as needed for sleep.    Historical Provider, MD  amLODipine (NORVASC) 5 MG tablet TAKE ONE TABLET BY MOUTH ONCE DAILY 01/04/16   Historical Provider, MD  Cetirizine HCl (ZYRTEC ALLERGY PO) Take by mouth daily.     Historical Provider, MD  fluticasone (FLONASE) 50 MCG/ACT nasal spray Place 2 sprays into the nose daily as needed for allergies.     Historical Provider, MD  folic acid (FOLVITE) 1 MG tablet Take 1 mg by mouth daily.      Historical Provider, MD  gabapentin (NEURONTIN) 100 MG capsule Take 100 mg by mouth. Takes 1 tablet in the am daily.    Historical Provider, MD  gabapentin (NEURONTIN) 400 MG capsule Take 400 mg by mouth at bedtime.     Historical Provider, MD  meclizine  (ANTIVERT) 25 MG tablet Take 1 tablet (25 mg total) by mouth 3 (three) times daily as needed. 09/01/16   Theodoro Grist, MD  polyvinyl alcohol (LIQUIFILM TEARS) 1.4 % ophthalmic solution Place 2 drops into both eyes 3 (three) times daily as needed (for dry eyes).    Historical Provider, MD  vancomycin (VANCOCIN) 50 mg/mL oral solution Take 5 mLs (250 mg total) by mouth every 6 (six) hours. 09/01/16   Theodoro Grist, MD  warfarin (COUMADIN) 2 MG tablet TAKE ONE TABLET BY MOUTH ONCE DAILY AS DIRECTED 09/05/16   Minna Merritts, MD  warfarin (COUMADIN) 3 MG tablet Take as directed by coumadin clinic Patient taking differently: Take 3 mg by mouth daily. 3mg  On Monday and 2 mg on all other days 05/04/16   Minna Merritts, MD    Allergies Amoxicillin; Cefdinir; Cilostazol; Levofloxacin; Plavix [clopidogrel bisulfate]; and Sulfur  Family History  Problem Relation Age of Onset  . Liver disease Father   . Cancer Mother   . Prostate cancer Neg Hx   . Kidney disease Neg Hx   . Kidney cancer Neg Hx   . Breast cancer Neg Hx   . Ovarian cancer Neg Hx   . Colon cancer Neg Hx  Social History Social History  Substance Use Topics  . Smoking status: Former Smoker    Quit date: 1996  . Smokeless tobacco: Never Used  . Alcohol use No    Review of Systems Constitutional: No fever/chills Eyes: No visual changes. ENT: No sore throat. No stiff neck no neck pain Cardiovascular: See history of present illness Respiratory: Denies shortness of breath. Gastrointestinal:   no vomiting.  No diarrhea.  No constipation. Genitourinary: Negative for dysuria. Musculoskeletal: Negative lower extremity swelling Skin: Negative for rash. Neurological: Negative for severe headaches, focal weakness or numbness. 10-point ROS otherwise negative.  ____________________________________________   PHYSICAL EXAM:  VITAL SIGNS: ED Triage Vitals  Enc Vitals Group     BP 12/27/16 1206 118/65     Pulse Rate 12/27/16  1206 60     Resp 12/27/16 1206 13     Temp 12/27/16 1206 98.2 F (36.8 C)     Temp Source 12/27/16 1206 Oral     SpO2 12/27/16 1206 94 %     Weight 12/27/16 1207 120 lb (54.4 kg)     Height 12/27/16 1207 5\' 2"  (1.575 m)     Head Circumference --      Peak Flow --      Pain Score 12/27/16 1207 10     Pain Loc --      Pain Edu? --      Excl. in [place]? --     Constitutional: Alert and oriented. Well appearing and in no acute distress. Eyes: Conjunctivae are normal. PERRL. EOMI. Head: Atraumatic. Nose: No congestion/rhinnorhea. Mouth/Throat: Mucous membranes are moist.  Oropharynx non-erythematous. Neck: No stridor.   Nontender with no meningismus Cardiovascular: Normal rate, regular rhythm. Grossly normal heart sounds.  Good peripheral circulation. Respiratory: Normal respiratory effort.  No retractions. Lungs CTAB. Abdominal: Soft and Minimal epigastric discomfort which seems to reproduce her pain. No distention. No guarding no rebound Back:  There is no focal tenderness or step off.  there is no midline tenderness there are no lesions noted. there is no CVA tenderness Musculoskeletal: No lower extremity tenderness, no upper extremity tenderness. No joint effusions, no DVT signs strong distal pulses no edema Neurologic:  Normal speech and language. No gross focal neurologic deficits are appreciated.  Skin:  Skin is warm, dry and intact. No rash noted. Psychiatric: Mood and affect are normal. Speech and behavior are normal.  ____________________________________________   LABS (all labs ordered are listed, but only abnormal results are displayed)  Labs Reviewed  GLUCOSE, CAPILLARY - Abnormal; Notable for the following:       Result Value   Glucose-Capillary 176 (*)    All other components within normal limits  COMPREHENSIVE METABOLIC PANEL  LACTIC ACID, PLASMA  LACTIC ACID, PLASMA  TROPONIN I  CBC WITH DIFFERENTIAL/PLATELET  PROTIME-INR  URINALYSIS, COMPLETE (UACMP) WITH  MICROSCOPIC  CBG MONITORING, ED   ____________________________________________  EKG  I personally interpreted any EKGs ordered by me or triage Patient with a ventricularly paced rhythm rate 63 bpm. No evidence of acute ischemia definitively noted ____________________________________________  RADIOLOGY  I reviewed any imaging ordered by me or triage that were performed during my shift and, if possible, patient and/or family made aware of any abnormal findings. ____________________________________________   PROCEDURES  Procedure(s) performed: None  Procedures  Critical Care performed: CRITICAL CARE Performed by: Schuyler Amor   Total critical care time: 45 minutes  Critical care time was exclusive of separately billable procedures and treating other patients.  Critical care  was necessary to treat or prevent imminent or life-threatening deterioration.  Critical care was time spent personally by me on the following activities: development of treatment plan with patient and/or surrogate as well as nursing, discussions with consultants, evaluation of patient's response to treatment, examination of patient, obtaining history from patient or surrogate, ordering and performing treatments and interventions, ordering and review of laboratory studies, ordering and review of radiographic studies, pulse oximetry and re-evaluation of patient's condition.   ____________________________________________   INITIAL IMPRESSION / ASSESSMENT AND PLAN / ED COURSE  Pertinent labs & imaging results that were available during my care of the patient were reviewed by me and considered in my medical decision making (see chart for details).  Patient with multiple different aches and pains today is not clear if this is a chest pain situation. If it is, she's had uninterrupted pain in her chest since last week making a STEMI unlikely. In addition, her EKG makes it very difficult to further assess. We  will send a troponin. Given the prolonged period of discomfort, I do feel that a troponin to be a very good screening test. Patient also has some abdominal discomfort. No evidence of AAA, abdomen is nonsurgical at this time however we will obtain blood work and reassess. She is in no acute distress at this time and we will continue to aggressively search for the cause of her discomfort   ----------------------------------------- 4:14 PM on 12/27/2016 -----------------------------------------  He will recheck a troponin. I did offer admission to the hospital for her ongoing discomfort she states her pain is totally gone and she is adamant that she would prefer to go home. She was seen and evaluated by the hospitalist who agrees with discharge. Therefore, we'll discharge the patient after second negative troponin and if such there is. Patient made aware of the increased size of her pancreatic mass and also the lymph node and the need for close follow-up and she hasn't been with her GI doctor tomorrow.  ----------------------------------------- 4:55 PM on 12/27/2016 -----------------------------------------  Pt with no chest pain. Second troponin is significantly elevated compared to the first. Patient has had aspirin and she is not having pain at this moment. She has had "twinges of pain off and on during the course of her stay here that she feels much better at this time. The patient does see Dr. Candis Musa  ----------------------------------------- 5:02 PM on 12/27/2016 -----------------------------------------  D/w Dr. Saunders Revel, who agrees that there is nothing on the EKG that would lead one to call a STEMI for this patient please very concerned about this large jump in troponin. We have given the patient aspirin as she has not yet had it, she has no pain at this moment, and Dr. and will come talk to the patient about whether they wish to do a cardiac catheterization. Patient and family given all the  updates about this patient's care.  ----------------------------------------- 5:56 PM on 12/27/2016 -----------------------------------------  Dr. Saunders Revel d/w patient and they elected not to go to the cath lab.  Dr. Saunders Revel wants a ntg gtt for recurrent pain and heparin gtt (though pt is already on coumadin, she did apparently infarct through it.) ____________________________________________   FINAL CLINICAL IMPRESSION(S) / ED DIAGNOSES  Final diagnoses:  Chest pain      This chart was dictated using voice recognition software.  Despite best efforts to proofread,  errors can occur which can change meaning.      Schuyler Amor, MD 12/27/16 [place]  Burlene Arnt, MD 12/27/16 [place], MD 12/27/16 [place], MD 12/27/16 [place], MD 12/27/16 Carollee Massed

## 2016-12-28 ENCOUNTER — Inpatient Hospital Stay (HOSPITAL_COMMUNITY)
Admit: 2016-12-28 | Discharge: 2016-12-28 | Disposition: A | Discharge status: Home / Self Care | Payer: Medicare Other | Attending: Cardiovascular Disease | Admitting: Cardiovascular Disease

## 2016-12-28 DIAGNOSIS — R079 Chest pain, unspecified: Secondary | ICD-10-CM

## 2016-12-28 DIAGNOSIS — K8689 Other specified diseases of pancreas: Secondary | ICD-10-CM

## 2016-12-28 DIAGNOSIS — K869 Disease of pancreas, unspecified: Secondary | ICD-10-CM

## 2016-12-28 DIAGNOSIS — E782 Mixed hyperlipidemia: Secondary | ICD-10-CM

## 2016-12-28 DIAGNOSIS — I25110 Atherosclerotic heart disease of native coronary artery with unstable angina pectoris: Secondary | ICD-10-CM

## 2016-12-28 LAB — CBC
HEMATOCRIT: 41.7 % (ref 35.0–47.0)
Hemoglobin: 14.8 g/dL (ref 12.0–16.0)
MCH: 32.3 pg (ref 26.0–34.0)
MCHC: 35.6 g/dL (ref 32.0–36.0)
MCV: 90.9 fL (ref 80.0–100.0)
Platelets: 137 10*3/uL — ABNORMAL LOW (ref 150–440)
RBC: 4.59 MIL/uL (ref 3.80–5.20)
RDW: 13.5 % (ref 11.5–14.5)
WBC: 9.6 10*3/uL (ref 3.6–11.0)

## 2016-12-28 LAB — BASIC METABOLIC PANEL
Anion gap: 7 (ref 5–15)
BUN: 10 mg/dL (ref 6–20)
CHLORIDE: 106 mmol/L (ref 101–111)
CO2: 23 mmol/L (ref 22–32)
Calcium: 8.5 mg/dL — ABNORMAL LOW (ref 8.9–10.3)
Creatinine, Ser: 0.68 mg/dL (ref 0.44–1.00)
GFR calc Af Amer: >60 mL/min (ref >60)
GFR calc non Af Amer: >60 mL/min (ref >60)
Glucose, Bld: 136 mg/dL — ABNORMAL HIGH (ref 65–99)
POTASSIUM: 3.9 mmol/L (ref 3.5–5.1)
SODIUM: 136 mmol/L (ref 135–145)

## 2016-12-28 LAB — GLUCOSE, CAPILLARY
GLUCOSE-CAPILLARY: 113 mg/dL — AB (ref 65–99)
Glucose-Capillary: 121 mg/dL — ABNORMAL HIGH (ref 65–99)

## 2016-12-28 LAB — ECHOCARDIOGRAM COMPLETE
HEIGHTINCHES: 62 in
Weight: 1936.52 oz

## 2016-12-28 LAB — LACTIC ACID, PLASMA: Lactic Acid, Venous: 1.8 mmol/L (ref 0.5–1.9)

## 2016-12-28 LAB — HEPARIN LEVEL (UNFRACTIONATED)
HEPARIN UNFRACTIONATED: 0.3 [IU]/mL (ref 0.30–0.70)
Heparin Unfractionated: 0.34 IU/mL (ref 0.30–0.70)

## 2016-12-28 LAB — TROPONIN I: TROPONIN I: 23.53 ng/mL — AB (ref <0.03)

## 2016-12-28 MED ORDER — ATORVASTATIN CALCIUM 20 MG PO TABS
40.0000 mg | ORAL_TABLET | Freq: Every day | ORAL | Status: DC
  Administered 2016-12-28 – 2016-12-30 (×3): 40 mg via ORAL
  Filled 2016-12-28 (×3): qty 2

## 2016-12-28 MED ORDER — ALPRAZOLAM 0.25 MG PO TABS: 0.2500 mg | ORAL_TABLET | Freq: Every day | ORAL | Status: DC

## 2016-12-28 MED ORDER — ASPIRIN EC 325 MG PO TBEC
325.0000 mg | DELAYED_RELEASE_TABLET | Freq: Every day | ORAL | Status: DC
  Administered 2016-12-28 – 2016-12-29 (×2): 325 mg via ORAL
  Filled 2016-12-28 (×2): qty 1

## 2016-12-28 MED ORDER — NITROGLYCERIN IN D5W 200-5 MCG/ML-% IV SOLN
0.0000 ug/min | INTRAVENOUS | Status: DC
  Administered 2016-12-28: 20 ug/min via INTRAVENOUS

## 2016-12-28 NOTE — Progress Notes (Signed)
Patient: [name] / Admit Date: 12/27/2016 / Date of Encounter: 12/28/2016, 4:21 PM   Hospital Problem List     Active Problems:   NSTEMI (non-ST elevated myocardial infarction) [facility])     Subjective   Patient reports no significant chest pain this morning Symptoms resolved overnight on nitroglycerin infusion and heparin Reports that she would consider cardiac catheterization Long discussion with her this morning concerning recent events Denies shortness of breath, currently feels comfortable just anxious  Inpatient Medications    Scheduled Meds: . amLODipine  5 mg Oral Daily  . aspirin EC  325 mg Oral Daily  . atorvastatin  40 mg Oral q1800  . docusate sodium  100 mg Oral BID  . folic acid  1 mg Oral Daily  . gabapentin  400 mg Oral QHS  . vancomycin  250 mg Oral Q6H   Continuous Infusions: . heparin 700 Units/hr (12/28/16 1303)  . nitroGLYCERIN 5 mcg/min (12/28/16 0643)   PRN Meds: acetaminophen **OR** acetaminophen, ALPRAZolam, bisacodyl, ondansetron **OR** ondansetron (ZOFRAN) IV, traZODone   Objective: Telemetry: Telemetry reviewed personally by myself showing ventricularly paced rhythm Physical Exam: Blood pressure (!) 110/57, pulse 60, temperature 98.8 F (37.1 C), temperature source Oral, resp. rate (!) 21, height 5\' 2"  (1.575 m), weight 121 lb 0.5 oz (54.9 kg), SpO2 95 %. Body mass index is 22.14 kg/m. GEN: Thin, in no acute distress.  HEENT: Grossly normal.  Neck: Supple, no JVD, carotid bruits, or masses. Cardiac: RRR, no murmurs, rubs, or gallops. No clubbing, cyanosis, edema.  Radials/DP/PT 2+ and equal bilaterally.  Respiratory:  Respirations regular and unlabored, clear to auscultation bilaterally. GI: Soft, nontender, nondistended, BS + x 4. MS: no deformity or atrophy. Skin: warm and dry, no rash. Neuro:  Strength and sensation are intact. Psych: AAOx3.  Normal affect.   Intake/Output Summary (Last 24 hours) at 12/28/16 1621 Last data  filed at 12/28/16 1400  Gross per 24 hour  Intake           627.09 ml  Output              400 ml  Net           227.09 ml     Labs: CBC  Recent Labs  12/27/16 1313 12/28/16 0245  WBC 12.0* 9.6  NEUTROABS 9.2*  --   HGB 14.7 14.8  HCT 43.2 41.7  MCV 92.7 90.9  PLT 151 0000000*   Basic Metabolic Panel  Recent Labs  12/27/16 1217 12/28/16 0245  NA 137 136  K 5.1 3.9  CL 104 106  CO2 27 23  GLUCOSE 172* 136*  BUN 16 10  CREATININE 0.80 0.68  CALCIUM 9.2 8.5*   Liver Function Tests  Recent Labs  12/27/16 1217  AST 57*  ALT 37  ALKPHOS 94  BILITOT 1.3*  PROT 6.7  ALBUMIN 3.9    Recent Labs  12/27/16 1313  LIPASE 34   Cardiac Enzymes  Recent Labs  12/27/16 1217 12/27/16 1615 12/28/16 1135  TROPONINI <0.03 13.73* 23.53*   BNP Invalid input(s): POCBNP D-Dimer No results for input(s): DDIMER in the last 72 hours. Hemoglobin A1C No results for input(s): HGBA1C in the last 72 hours. Fasting Lipid Panel No results for input(s): CHOL, HDL, LDLCALC, TRIG, CHOLHDL, LDLDIRECT in the last 72 hours. Thyroid Function Tests No results for input(s): TSH, T4TOTAL, T3FREE, THYROIDAB in the last 72 hours.  Invalid input(s): FREET3  Weights: [employer]   12/27/16 1207  12/28/16 0100  Weight: 120 lb (54.4 kg) 121 lb 0.5 oz (54.9 kg)     Radiology/Studies:  Ct Abdomen Pelvis W Contrast  Result Date: 12/27/2016  IMPRESSION: 1. No acute abdominal findings or mass lesions. 2. Enlarging cystic lesion in the pancreatic head. Recommend MRI abdomen/MRCP without with contrast for further evaluation. 3. Enlarged left external iliac lymph node. Given its size biopsy may be indicated. No other lymphadenopathy is demonstrated. 4. Mild cirrhotic changes involving the liver. 5. Advanced atherosclerotic calcifications involving the aorta and branch vessels and also the coronary arteries. Electronically Signed   By: Marijo Sanes M.D.   On: 12/27/2016 15:06   Dg Chest Port  1 View  Result Date: 12/27/2016 IMPRESSION: COPD.  No pneumonia nor CHF nor other acute cardiopulmonary disease. Thoracic aortic atherosclerosis. Electronically Signed   By: David  [place] M.D.   On: 12/27/2016 12:45     Assessment and Plan  83 y.o. female   1) Non-ST elevation MI Long discussion with patient today concerning various treatment options She raised the possibility of doing a cardiac catheterization, 1-2 discuss further We requested her family come into the hospital for further discussion with her Echocardiogram was ordered and reviewed by myself this morning Echocardiogram shows mild inferior and inferolateral wall hypokinesis ejection fraction 50-55%, no significant valvular disease Images were reviewed with her at the bedside, as well as with her son  Long discussion, as ejection fraction close to normal, currently asymptomatic , and given her age, she prefers no cardiac catheterization at this time   -This is certainly a very acceptable plan given the above Peak troponin so far 23.5 performed 11:00 this morning Both patient and her son agree with medical management  We have suggested that if she has recurrence of her angina that cardiac catheterization could be reconsidered either inpatient or outpatient  We have recommended that we continue nitroglycerin infusion and heparin until enzymes are trending downward Recommended we recheck troponin in the morning She is currently on warfarin for atrial fibrillation --She may benefit from the addition of Plavix after heparin held   potentially could hold the aspirin  Other options for underlying ischemia include holding amlodipine, starting isosorbide mononitrate 30 mg daily (if blood pressure tolerates) Currently not on beta blocker given her bradycardia Currently on Lipitor 40 mg daily  2) Permanent atrial fibrillation status post AV node ablation and PPM placement. INR therapeutic  -Would restart  warfarin  3)Hypertension Unable to add beta blockers given bradycardia though she does have pacemaker Backup rate appears to be 50 If blood pressure allows could add isosorbide  Code status: DNR/DNI - confirmed with patient and patient's son    Total encounter time more than 35 minutes Greater than 50% was spent in counseling and coordination of care with the patient   Signed, Esmond Plants, MD, Ph.D. [facility] 12/28/2016, 4:21 PM

## 2016-12-28 NOTE — Progress Notes (Signed)
[place] at [facility]                                                                                                                                                                                  Patient Demographics   [name], is a 83 y.o. female, DOB - Mar 24, 1928, [id number]  Admit date - 12/27/2016   Admitting Physician Epifanio Lesches, MD  Outpatient Primary MD for the patient is Winfall, MD   LOS - 1  Subjective: Ace and currently on nitroglycerin drip denies any chest pain or shortness of breath.    Review of Systems:   CONSTITUTIONAL: No documented fever. No fatigue, weakness. No weight gain, no weight loss.  EYES: No blurry or double vision.  ENT: No tinnitus. No postnasal drip. No redness of the oropharynx.  RESPIRATORY: No cough, no wheeze, no hemoptysis. No dyspnea.  CARDIOVASCULAR: No chest pain. No orthopnea. No palpitations. No syncope.  GASTROINTESTINAL: No nausea, no vomiting or diarrhea. No abdominal pain. No melena or hematochezia.  GENITOURINARY: No dysuria or hematuria.  ENDOCRINE: No polyuria or nocturia. No heat or cold intolerance.  HEMATOLOGY: No anemia. No bruising. No bleeding.  INTEGUMENTARY: No rashes. No lesions.  MUSCULOSKELETAL: No arthritis. No swelling. No gout.  NEUROLOGIC: No numbness, tingling, or ataxia. No seizure-type activity.  PSYCHIATRIC: No anxiety. No insomnia. No ADD.    Vitals:   Vitals:   12/28/16 0400 12/28/16 0500 12/28/16 0600 12/28/16 0800  BP: (!) 97/52 (!) 93/53 (!) 98/58 (!) 103/55  Pulse: (!) 50 (!) 50 (!) 50 (!) 54  Resp: 18 18 20 18   Temp:    98 F (36.7 C)  TempSrc:    Oral  SpO2: 92% 92% 93% 93%  Weight:      Height:        Wt Readings from Last 3 Encounters:  12/28/16 121 lb 0.5 oz (54.9 kg)  09/30/16 118 lb 8 oz (53.8 kg)  09/20/16 119 lb 8 oz (54.2 kg)     Intake/Output Summary (Last 24 hours) at 12/28/16 1255 Last data filed at 12/28/16 0600   Gross per 24 hour  Intake           121.53 ml  Output                0 ml  Net           121.53 ml    Physical Exam:   GENERAL: Pleasant-appearing in no apparent distress.  HEAD, EYES, EARS, NOSE AND THROAT: Atraumatic, normocephalic. Extraocular muscles are intact. Pupils equal and reactive to light. Sclerae anicteric. No conjunctival injection. No oro-pharyngeal erythema.  NECK: Supple. There is no jugular venous distention. No bruits, no lymphadenopathy, no thyromegaly.  HEART: Regular rate and rhythm,. No murmurs, no rubs, no clicks.  LUNGS: Clear to auscultation bilaterally. No rales or rhonchi. No wheezes.  ABDOMEN: Soft, flat, nontender, nondistended. Has good bowel sounds. No hepatosplenomegaly appreciated.  EXTREMITIES: No evidence of any cyanosis, clubbing, or peripheral edema.  +2 pedal and radial pulses bilaterally.  NEUROLOGIC: The patient is alert, awake, and oriented x3 with no focal motor or sensory deficits appreciated bilaterally.  SKIN: Moist and warm with no rashes appreciated.  Psych: Not anxious, depressed LN: No inguinal LN enlargement    Antibiotics   Anti-infectives    Start     Dose/Rate Route Frequency Ordered Stop   12/27/16 1815  vancomycin (VANCOCIN) 50 mg/mL oral solution 250 mg    Comments:  Patient taking differently: Qid every 3 days     250 mg Oral Every 6 hours 12/27/16 1812        Medications   Scheduled Meds: . amLODipine  5 mg Oral Daily  . docusate sodium  100 mg Oral BID  . folic acid  1 mg Oral Daily  . gabapentin  400 mg Oral QHS  . vancomycin  250 mg Oral Q6H   Continuous Infusions: . heparin 650 Units/hr (12/28/16 0600)  . nitroGLYCERIN 5 mcg/min (12/28/16 0643)   PRN Meds:.acetaminophen **OR** acetaminophen, ALPRAZolam, bisacodyl, ondansetron **OR** ondansetron (ZOFRAN) IV, traZODone   Data Review:   Micro Results No results found for this or any previous visit (from the past 240 hour(s)).  Radiology Reports Ct Abdomen  Pelvis W Contrast  Result Date: 12/27/2016 CLINICAL DATA:  Epigastric and upper abdominal pain for 1 week. Decreased appetite. EXAM: CT ABDOMEN AND PELVIS WITH CONTRAST TECHNIQUE: Multidetector CT imaging of the abdomen and pelvis was performed using the standard protocol following bolus administration of intravenous contrast. CONTRAST:  50mL ISOVUE-300 IOPAMIDOL (ISOVUE-300) INJECTION 61% COMPARISON:  CT scan 06/05/2014 FINDINGS: Lower chest: The lung bases demonstrate significant breathing motion artifact. There are bibasilar scarring changes but no worrisome lesions or pleural effusion. The heart is enlarged. Three-vessel coronary artery calcifications are noted along with aortic calcifications. The distal esophagus is grossly normal. Small hiatal hernia. Hepatobiliary: No focal hepatic lesions are identified. The liver contour is irregular suggesting changes of cirrhosis. No focal hepatic lesions or intrahepatic biliary dilatation. The gallbladder is surgically absent. No common bile duct dilatation. Pancreas: Enlarging cystic lesion in the Ing pancreatic head measuring 23 x 21 mm. This measured a maximum of 9 mm on the prior study. It is adjacent to the common bile duct but I do not think it is associated with a duct. It has benign imaging features. There is no nodularity or enhancement. I would recommend MRI abdomen/ MRCP without and with contrast for further evaluation. The pancreatic duct is normal. Spleen: Normal size.  No focal lesions. Adrenals/Urinary Tract: The adrenal glands and kidneys are unremarkable. There is a small angiomyolipoma associated with the midpole lower pole junction region of the left kidney. This measures 6 mm and is unchanged. No worrisome renal lesions or hydronephrosis. No obstructing ureteral calculi or bladder calculi. No bladder mass. Stomach/Bowel: Distended elongated stomach but no lesions or acute inflammation. The duodenum, small bowel and colon are unremarkable. No  inflammatory changes, mass lesions or obstructive findings. Moderate sigmoid diverticulosis but no findings for acute diverticulitis. The terminal ileum is normal. The appendix is not identified for certain. Vascular/Lymphatic: Advanced atherosclerotic calcifications involving the  aorta and branch vessels. No aneurysm or dissection. The major venous structures are patent. No mesenteric or retroperitoneal mass or lymphadenopathy. Reproductive: Surgically absent. Other: There is an enlarged, 27 x 17 mm left external iliac lymph node on the left side. This was not present on the prior CT scan. I do not see any other lymphadenopathy typical along with at. There is a single right-sided inguinal lymph node which measures 10 mm which is likely normal. Musculoskeletal: No significant bony findings. Stable degenerative changes involving the spine along with osteoporosis. Chondrocalcinosis noted at the pubic symphysis. Benign-appearing sclerotic lesion in the left inferior pubic ramus is likely a bone island. IMPRESSION: 1. No acute abdominal findings or mass lesions. 2. Enlarging cystic lesion in the pancreatic head. Recommend MRI abdomen/MRCP without with contrast for further evaluation. 3. Enlarged left external iliac lymph node. Given its size biopsy may be indicated. No other lymphadenopathy is demonstrated. 4. Mild cirrhotic changes involving the liver. 5. Advanced atherosclerotic calcifications involving the aorta and branch vessels and also the coronary arteries. Electronically Signed   By: Marijo Sanes M.D.   On: 12/27/2016 15:06   Dg Chest Port 1 View  Result Date: 12/27/2016 CLINICAL DATA:  One week of mid chest pain with increased symptoms over the past 2 days. Also diarrhea. History of non-Hodgkin's lymphoma, hemochromatosis, coronary artery did disease and heart block, C. difficile colitis with feces transplant. Former smoker. EXAM: PORTABLE CHEST 1 VIEW COMPARISON:  Chest x-ray of January 27, 2013 FINDINGS:  The lungs remain well-expanded. The interstitial markings are mildly prominent though stable. There is no alveolar infiltrate, pleural effusion, or pneumothorax. The heart is top-normal in size. The pulmonary vascularity is normal. The permanent pacemaker is in stable position. There is calcification in the wall of the aortic arch. The observed bony thorax exhibits no acute abnormality. IMPRESSION: COPD.  No pneumonia nor CHF nor other acute cardiopulmonary disease. Thoracic aortic atherosclerosis. Electronically Signed   By: David  Martinique M.D.   On: 12/27/2016 12:45     CBC  Recent Labs Lab 12/27/16 1313 12/28/16 0245  WBC 12.0* 9.6  HGB 14.7 14.8  HCT 43.2 41.7  PLT 151 137*  MCV 92.7 90.9  MCH 31.5 32.3  MCHC 34.0 35.6  RDW 13.6 13.5  LYMPHSABS 1.8  --   MONOABS 0.8  --   EOSABS 0.0  --   BASOSABS 0.1  --     Chemistries   Recent Labs Lab 12/27/16 1217 12/28/16 0245  NA 137 136  K 5.1 3.9  CL 104 106  CO2 27 23  GLUCOSE 172* 136*  BUN 16 10  CREATININE 0.80 0.68  CALCIUM 9.2 8.5*  AST 57*  --   ALT 37  --   ALKPHOS 94  --   BILITOT 1.3*  --    ------------------------------------------------------------------------------------------------------------------ estimated creatinine clearance is 39.9 mL/min (by C-G formula based on SCr of 0.68 mg/dL). ------------------------------------------------------------------------------------------------------------------ No results for input(s): HGBA1C in the last 72 hours. ------------------------------------------------------------------------------------------------------------------ No results for input(s): CHOL, HDL, LDLCALC, TRIG, CHOLHDL, LDLDIRECT in the last 72 hours. ------------------------------------------------------------------------------------------------------------------ No results for input(s): TSH, T4TOTAL, T3FREE, THYROIDAB in the last 72 hours.  Invalid input(s):  FREET3 ------------------------------------------------------------------------------------------------------------------ No results for input(s): VITAMINB12, FOLATE, FERRITIN, TIBC, IRON, RETICCTPCT in the last 72 hours.  Coagulation profile  Recent Labs Lab 12/27/16 1217  INR 2.23    No results for input(s): DDIMER in the last 72 hours.  Cardiac Enzymes  Recent Labs Lab 12/27/16 1217 12/27/16 1615 12/28/16 1135  TROPONINI <0.03 13.73* 23.53*   ------------------------------------------------------------------------------------------------------------------ Invalid input(s): POCBNP    Assessment & Plan   83 year old female patient with multiple medical problems of essential hypertension, atrial fibrillation, recurrent C. difficile colitis having chest pain and found to have elevated troponins with non-ST elevation MI  #1Non-ST MI Continued therapy with heparin, wean nitroglycerin Patient seen by cardiology trying to decide if she wants to have a cardiac catheterization I'll start patient on aspirin We'll start patient on Lipitor 40 mg Await echocardiogram of the heart  #2. recurrent C. difficile: Patient on suppressive therapy with vancomycin, follows up with Dr. Vira Agar, continue vancomycin 250 mg every 6 hours #3  enlarging pancreas pancreatic mass, I'll check CA-19-9 All the records are reviewed and case discussed with ED provider. Management plans discussed with the patient, family and they are in agreement.  CODE STATUS: DNR      Code Status Orders        Start     Ordered   12/27/16 1811  Do not attempt resuscitation (DNR)  Continuous    Question Answer Comment  In the event of cardiac or respiratory ARREST Do not call a "code blue"   In the event of cardiac or respiratory ARREST Do not perform Intubation, CPR, defibrillation or ACLS   In the event of cardiac or respiratory ARREST Use medication by any route, position, wound care, and other  measures to relive pain and suffering. May use oxygen, suction and manual treatment of airway obstruction as needed for comfort.      12/27/16 1812    Code Status History    Date Active Date Inactive Code Status Order ID Comments User Context   08/28/2016  9:36 PM 09/01/2016  4:56 PM DNR [id number]  Loletha Grayer, MD ED    Advance Directive Documentation   [place] Most Recent Value  Type of Advance Directive  Out of facility DNR (pink MOST or yellow form)  Pre-existing out of facility DNR order (yellow form or pink MOST form)  Yellow form placed in chart (order not valid for inpatient use)  "MOST" Form in Place?  No data           Consults  cardiology   DVT Prophylaxis  heaprin  Lab Results  Component Value Date   PLT 137 (L) 12/28/2016     Time Spent in minutes   41min Greater than 50% of time spent in care coordination and counseling patient regarding the condition and plan of care.   Dustin Flock M.D on 12/28/2016 at 12:55 PM  Between 7am to 6pm - Pager - [phone]  After 6pm go to [email or web address] - password [email or web address] [facility]   Office  [phone]

## 2016-12-28 NOTE — Progress Notes (Signed)
[place] responded to an OR for prayer. Pt was resting in bed. Daughter was bedside. Pt stated that she is a strong [place] who knows that God is with her. Pt requested prayer for healing if that is what God wanted to do. CH offered prayer and empathetic listening. CH is available for follow up as needed.    12/28/16 1500  Clinical Encounter Type  Visited With Patient;Patient and family together  Visit Type Initial;Spiritual support  Referral From Nurse  Spiritual Encounters  Spiritual Needs Prayer

## 2016-12-28 NOTE — Progress Notes (Signed)
*  PRELIMINARY RESULTS* Echocardiogram 2D Echocardiogram has been performed.  [name] 12/28/2016, 9:48 AM

## 2016-12-28 NOTE — Progress Notes (Signed)
ANTICOAGULATION CONSULT NOTE - Initial Consult  Pharmacy Consult for Heparin  Indication: chest pain/ACS  Allergies  Allergen Reactions  . Amoxicillin   . Cefdinir Diarrhea  . Cilostazol     Other reaction(s): Headache  . Levofloxacin   . Plavix [Clopidogrel Bisulfate]     12/12/11 patient reports she has never taken Plavix before.   . Sulfur Nausea Only    Patient Measurements: Height: 5\' 2"  (157.5 cm) Weight: 121 lb 0.5 oz (54.9 kg) IBW/kg (Calculated) : 50.1 Heparin Dosing Weight: 54  Vital Signs: Temp: 97.9 F (36.6 C) (02/28 0100) Temp Source: Oral (02/28 0100) BP: 96/49 (02/28 0300) Pulse Rate: 50 (02/28 0300)  Labs:  Recent Labs  12/27/16 1217 12/27/16 1313 12/27/16 1615 12/28/16 0245  HGB  --  14.7  --  14.8  HCT  --  43.2  --  41.7  PLT  --  151  --  137*  LABPROT 25.1*  --   --   --   INR 2.23  --   --   --   HEPARINUNFRC  --   --   --  0.34  CREATININE 0.80  --   --  0.68  TROPONINI <0.03  --  13.73*  --     Estimated Creatinine Clearance: 39.9 mL/min (by C-G formula based on SCr of 0.68 mg/dL).   Medical History: Past Medical History:  Diagnosis Date  . Atrial fibrillation ([place]) 07/20/2009   Qualifier: Diagnosis of  By: Caryl Comes, MD, Leonidas Romberg Mack Guise   . C. difficile colitis    feces transplant  . Complete heart block  s/p av ablation    with atrial fibrillation. Pt is s/p AV nodal ablation due to permanent and symptomatic atrial fibrillation.   . Coronary artery disease    Nonobstructive  . Diverticulitis   . GERD (gastroesophageal reflux disease)   . Hereditary hemochromatosis ([place])    Phlebotomized in the past, not getting phlebotomy now  . Hyperlipidemia   . Hypertension   . Non Hodgkin's lymphoma ([place])    stable; followed by an oncologist  . Osteoarthritis   . 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 Pacemaker-dual-chamber-Medtronic 04/04/2011    Medications:  Prescriptions Prior to Admission  Medication Sig Dispense Refill Last Dose   . ALPRAZolam (XANAX) 0.25 MG tablet Take 0.5 mg by mouth at bedtime as needed for sleep.   12/26/2016 at pm  . amLODipine (NORVASC) 5 MG tablet TAKE ONE TABLET BY MOUTH ONCE DAILY   12/26/2016 at pm  . Cetirizine HCl (ZYRTEC ALLERGY PO) Take by mouth daily.    12/26/2016 at pm  . fluticasone (FLONASE) 50 MCG/ACT nasal spray Place 2 sprays into the nose daily as needed for allergies.    [id number] at pm  . folic acid (FOLVITE) 1 MG tablet Take 1 mg by mouth daily.     12/26/2016 at pm  . gabapentin (NEURONTIN) 400 MG capsule Take 400 mg by mouth at bedtime.    12/26/2016 at pm  . meclizine (ANTIVERT) 25 MG tablet Take 1 tablet (25 mg total) by mouth 3 (three) times daily as needed. 30 tablet 0 prn at prn  . polyvinyl alcohol (LIQUIFILM TEARS) 1.4 % ophthalmic solution Place 2 drops into both eyes 3 (three) times daily as needed (for dry eyes).   prn at prn  . vancomycin (VANCOCIN) 50 mg/mL oral solution Take 5 mLs (250 mg total) by mouth every 6 (six) hours. (Patient taking differently: Take 250 mg by mouth every 6 (  six) hours. Qid every 3 days) 240 mL 0 12/27/2016 at am  . warfarin (COUMADIN) 2 MG tablet TAKE ONE TABLET BY MOUTH ONCE DAILY AS DIRECTED (Patient taking differently: TAKE ONE TABLET BY MOUTH ONCE DAILY) 30 tablet 3 12/26/2016 at 1800  . gabapentin (NEURONTIN) 100 MG capsule Take 100 mg by mouth. Takes 1 tablet in the am daily.   Not Taking  . warfarin (COUMADIN) 3 MG tablet Take as directed by coumadin clinic (Patient not taking: Reported on 12/27/2016) 20 tablet 3 Not Taking at Unknown time   Scheduled:  . amLODipine  5 mg Oral Daily  . docusate sodium  100 mg Oral BID  . folic acid  1 mg Oral Daily  . gabapentin  400 mg Oral QHS  . vancomycin  250 mg Oral Q6H    Assessment: Pharmacy consulted to dose and monitor heparin in this 83 year old woman being treated for ACS. Patient is also on warfarin as an outpatient.  Goal of Therapy:  Heparin level 0.3-0.7 units/ml Monitor platelets by  anticoagulation protocol: Yes   Plan:  Give 3200 units bolus x 1 Start heparin infusion at 650 units/hr Check anti-Xa level in 8 hours and daily while on heparin Continue to monitor H&H and platelets   2/28 0245 HL therapeutic x 1. Continue current rate. Will recheck HL in 8 hours.  Laural Benes, Pharm.D., BCPS Clinical Pharmacist 12/28/2016,4:06 AM

## 2016-12-28 NOTE — Progress Notes (Addendum)
This patient came to the ICU from the ED just before midnight. Patient's chest pain was 6/10 upon my initla assessment. NTG gtt increased to 20 mcg/min from 15 mcg/min that she arrived on. Patient reported relief of pain w/ this rate. She was given her PM alprazolam and quickly went to sleep. When patient's SBP was noted to in the 90's, the gtt was reduced to 15 mcg/min. She is still sleeping at this time, easy to arouse, w/ no complaints of chest pain, nausea, SOB; skin warm and dry. Will continue to monitor. Addendum - NTG gtt reduced to 10 mcg/min. SBP still in the 90's. Reduced again to 5 mcg/min. Patient denies pain at this time [id number]).

## 2016-12-28 NOTE — Progress Notes (Signed)
ANTICOAGULATION CONSULT NOTE - Initial Consult  Pharmacy Consult for Heparin  Indication: chest pain/ACS  Allergies  Allergen Reactions  . Amoxicillin   . Cefdinir Diarrhea  . Cilostazol     Other reaction(s): Headache  . Levofloxacin   . Plavix [Clopidogrel Bisulfate]     12/12/11 patient reports she has never taken Plavix before.   . Sulfur Nausea Only    Patient Measurements: Height: 5\' 2"  (157.5 cm) Weight: 121 lb 0.5 oz (54.9 kg) IBW/kg (Calculated) : 50.1 Heparin Dosing Weight: 54  Vital Signs: Temp: 98 F (36.7 C) (02/28 0800) Temp Source: Oral (02/28 0800) BP: 103/55 (02/28 0800) Pulse Rate: 54 (02/28 0800)  Labs:  Recent Labs  12/27/16 1217 12/27/16 1313 12/27/16 1615 12/28/16 0245 12/28/16 1135  HGB  --  14.7  --  14.8  --   HCT  --  43.2  --  41.7  --   PLT  --  151  --  137*  --   LABPROT 25.1*  --   --   --   --   INR 2.23  --   --   --   --   HEPARINUNFRC  --   --   --  0.34 0.30  CREATININE 0.80  --   --  0.68  --   TROPONINI <0.03  --  13.73*  --  23.53*    Estimated Creatinine Clearance: 39.9 mL/min (by C-G formula based on SCr of 0.68 mg/dL).   Medical History: Past Medical History:  Diagnosis Date  . Atrial fibrillation ([place]) 07/20/2009   Qualifier: Diagnosis of  By: Caryl Comes, MD, Leonidas Romberg Mack Guise   . C. difficile colitis    feces transplant  . Complete heart block  s/p av ablation    with atrial fibrillation. Pt is s/p AV nodal ablation due to permanent and symptomatic atrial fibrillation.   . Coronary artery disease    Nonobstructive  . Diverticulitis   . GERD (gastroesophageal reflux disease)   . Hereditary hemochromatosis ([place])    Phlebotomized in the past, not getting phlebotomy now  . Hyperlipidemia   . Hypertension   . Non Hodgkin's lymphoma ([place])    stable; followed by an oncologist  . Osteoarthritis   . 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 Pacemaker-dual-chamber-Medtronic 04/04/2011    Medications:  Prescriptions Prior  to Admission  Medication Sig Dispense Refill Last Dose  . ALPRAZolam (XANAX) 0.25 MG tablet Take 0.5 mg by mouth at bedtime as needed for sleep.   12/26/2016 at pm  . amLODipine (NORVASC) 5 MG tablet TAKE ONE TABLET BY MOUTH ONCE DAILY   12/26/2016 at pm  . Cetirizine HCl (ZYRTEC ALLERGY PO) Take by mouth daily.    12/26/2016 at pm  . fluticasone (FLONASE) 50 MCG/ACT nasal spray Place 2 sprays into the nose daily as needed for allergies.    [id number] at pm  . folic acid (FOLVITE) 1 MG tablet Take 1 mg by mouth daily.     12/26/2016 at pm  . gabapentin (NEURONTIN) 400 MG capsule Take 400 mg by mouth at bedtime.    12/26/2016 at pm  . meclizine (ANTIVERT) 25 MG tablet Take 1 tablet (25 mg total) by mouth 3 (three) times daily as needed. 30 tablet 0 prn at prn  . polyvinyl alcohol (LIQUIFILM TEARS) 1.4 % ophthalmic solution Place 2 drops into both eyes 3 (three) times daily as needed (for dry eyes).   prn at prn  . vancomycin (VANCOCIN) 50 mg/mL oral solution  Take 5 mLs (250 mg total) by mouth every 6 (six) hours. (Patient taking differently: Take 250 mg by mouth every 6 (six) hours. Qid every 3 days) 240 mL 0 12/27/2016 at am  . warfarin (COUMADIN) 2 MG tablet TAKE ONE TABLET BY MOUTH ONCE DAILY AS DIRECTED (Patient taking differently: TAKE ONE TABLET BY MOUTH ONCE DAILY) 30 tablet 3 12/26/2016 at 1800  . gabapentin (NEURONTIN) 100 MG capsule Take 100 mg by mouth. Takes 1 tablet in the am daily.   Not Taking  . warfarin (COUMADIN) 3 MG tablet Take as directed by coumadin clinic (Patient not taking: Reported on 12/27/2016) 20 tablet 3 Not Taking at Unknown time   Scheduled:  . amLODipine  5 mg Oral Daily  . docusate sodium  100 mg Oral BID  . folic acid  1 mg Oral Daily  . gabapentin  400 mg Oral QHS  . vancomycin  250 mg Oral Q6H    Assessment: Pharmacy consulted to dose and monitor heparin in this 83 year old woman being treated for ACS. Patient is also on warfarin as an outpatient.   Goal of  Therapy:  Heparin level 0.3-0.7 units/ml Monitor platelets by anticoagulation protocol: Yes   Plan:  INR therapeutic on admission but last dose of warfarin was 2/26 and troponin continues to increase. Will increase heparin drip slightly as HL is at the low end of goal range. Will f/u cardiology plans for cath and duration of heparin therapy as well as plans to resume warfarin. Will recheck a HL and PT/INR in AM.   Napoleon Form, PharmD Clinical Pharmacist 12/28/2016,12:47 PM

## 2016-12-29 LAB — PROTIME-INR
INR: 1.87
Prothrombin Time: 21.8 seconds — ABNORMAL HIGH (ref 11.4–15.2)

## 2016-12-29 LAB — LIPID PANEL
CHOL/HDL RATIO: 4.7 ratio
CHOLESTEROL: 155 mg/dL (ref 0–200)
HDL: 33 mg/dL — ABNORMAL LOW (ref >40)
LDL Cholesterol: 92 mg/dL (ref 0–99)
TRIGLYCERIDES: 152 mg/dL — AB (ref <150)
VLDL: 30 mg/dL (ref 0–40)

## 2016-12-29 LAB — HEPARIN LEVEL (UNFRACTIONATED)
Heparin Unfractionated: 0.28 IU/mL — ABNORMAL LOW (ref 0.30–0.70)
Heparin Unfractionated: 0.42 IU/mL (ref 0.30–0.70)

## 2016-12-29 LAB — CBC
HCT: 41.3 % (ref 35.0–47.0)
HEMOGLOBIN: 14.6 g/dL (ref 12.0–16.0)
MCH: 32.8 pg (ref 26.0–34.0)
MCHC: 35.3 g/dL (ref 32.0–36.0)
MCV: 93 fL (ref 80.0–100.0)
PLATELETS: 126 10*3/uL — AB (ref 150–440)
RBC: 4.44 MIL/uL (ref 3.80–5.20)
RDW: 13.4 % (ref 11.5–14.5)
WBC: 8.2 10*3/uL (ref 3.6–11.0)

## 2016-12-29 LAB — TROPONIN I: Troponin I: 10.93 ng/mL (ref <0.03)

## 2016-12-29 LAB — GLUCOSE, CAPILLARY: Glucose-Capillary: 110 mg/dL — ABNORMAL HIGH (ref 65–99)

## 2016-12-29 MED ORDER — HEPARIN BOLUS VIA INFUSION
800.0000 [IU] | Freq: Once | INTRAVENOUS | Status: AC
  Administered 2016-12-29: 800 [IU] via INTRAVENOUS
  Filled 2016-12-29: qty 800

## 2016-12-29 MED ORDER — WARFARIN - PHARMACIST DOSING INPATIENT
Freq: Every day | Status: DC
  Administered 2016-12-29: 17:00:00

## 2016-12-29 MED ORDER — WARFARIN SODIUM 1 MG PO TABS
2.0000 mg | ORAL_TABLET | Freq: Every day | ORAL | Status: DC
  Administered 2016-12-29: 2 mg via ORAL
  Filled 2016-12-29: qty 2

## 2016-12-29 NOTE — Progress Notes (Addendum)
[name] ANTICOAGULATION CONSULT NOTE - Initial Consult  Pharmacy Consult for Warfarin Indication: atrial fibrillation  Allergies  Allergen Reactions  . Amoxicillin   . Cefdinir Diarrhea  . Cilostazol     Other reaction(s): Headache  . Levofloxacin   . Plavix [Clopidogrel Bisulfate]     12/12/11 patient reports she has never taken Plavix before.   . Sulfur Nausea Only    Patient Measurements: Height: 5\' 2"  (157.5 cm) Weight: 125 lb 10.6 oz (57 kg) IBW/kg (Calculated) : 50.1 Heparin Dosing Weight:    Vital Signs: Temp: 97.8 F (36.6 C) (03/01 1153) Temp Source: Oral (03/01 1153) BP: 104/44 (03/01 1153) Pulse Rate: 59 (03/01 1153)  Labs:  Recent Labs  12/27/16 1217  12/27/16 1313 12/27/16 1615 12/28/16 0245 12/28/16 1135 12/29/16 0521 12/29/16 1009  HGB  --   < > 14.7  --  14.8  --  14.6  --   HCT  --   --  43.2  --  41.7  --  41.3  --   PLT  --   --  151  --  137*  --  126*  --   LABPROT 25.1*  --   --   --   --   --  21.8*  --   INR 2.23  --   --   --   --   --  1.87  --   HEPARINUNFRC  --   --   --   --  0.34 0.30 0.28*  --   CREATININE 0.80  --   --   --  0.68  --   --   --   TROPONINI <0.03  --   --  13.73*  --  23.53*  --  10.93*  < > = values in this interval not displayed.  Estimated Creatinine Clearance: 39.9 mL/min (by C-G formula based on SCr of 0.68 mg/dL).   Medical History: Past Medical History:  Diagnosis Date  . Atrial fibrillation ([place]) 07/20/2009   Qualifier: Diagnosis of  By: Caryl Comes, MD, Leonidas Romberg Mack Guise   . C. difficile colitis    feces transplant  . Complete heart block  s/p av ablation    with atrial fibrillation. Pt is s/p AV nodal ablation due to permanent and symptomatic atrial fibrillation.   . Coronary artery disease    Nonobstructive  . Diverticulitis   . GERD (gastroesophageal reflux disease)   . Hereditary hemochromatosis ([place])    Phlebotomized in the past, not getting phlebotomy now  . Hyperlipidemia   . Hypertension   .  Non Hodgkin's lymphoma ([place])    stable; followed by an oncologist  . Osteoarthritis   . 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 Pacemaker-dual-chamber-[employer] 04/04/2011    Medications:  Scheduled:  . aspirin EC  325 mg Oral Daily  . atorvastatin  40 mg Oral q1800  . docusate sodium  100 mg Oral BID  . folic acid  1 mg Oral Daily  . gabapentin  400 mg Oral QHS  . vancomycin  250 mg Oral Q6H  . warfarin  2 mg Oral q1800  . Warfarin - Pharmacist Dosing Inpatient   Does not apply q1800   Infusions:  . heparin 800 Units/hr (12/29/16 0703)    Assessment: 83 yo F with NSTEMI, Chronic Afib.Patient on Warfarin 2 mg daily at home. Last dose of Warfarin on 12/26/16. Per Cardiology She is a frail elderly lady. Currently on Heparin drip.  2/27  INR 2.23   No Warfarin 2/28  No INR   No Warfarin 3/1    INR 1.87   Goal of Therapy:  INR 2-3 Monitor platelets by anticoagulation protocol: Yes     Plan:  3/1: Per Cardiology-will need heparin to Coumadin bridge until therapeutic INR  Will restart Patient Home dose of Warfarin 2 mg po Daily.  Patient also on Vancomycin ORAL. Will f/u INR with am labs.  [name] A 12/29/2016,1:19 PM

## 2016-12-29 NOTE — Progress Notes (Addendum)
[place] [facility]                                                                                                                                                                                  Patient Demographics   [name], is a 83 y.o. female, DOB - 09/21/1928, [id number]  Admit date - 12/27/2016   Admitting Physician Epifanio Lesches, MD  Outpatient Primary MD for the patient is [place], MD   LOS - 2  Subjective: She has no further chest pain. Was taken off nitroglycerin last night. Patient's troponin peaked at 23 Patient decided not to undergo cardiac catheterization     Review of Systems:   CONSTITUTIONAL: No documented fever. No fatigue, weakness. No weight gain, no weight loss.  EYES: No blurry or double vision.  ENT: No tinnitus. No postnasal drip. No redness of the oropharynx.  RESPIRATORY: No cough, no wheeze, no hemoptysis. No dyspnea.  CARDIOVASCULAR: No chest pain. No orthopnea. No palpitations. No syncope.  GASTROINTESTINAL: No nausea, no vomiting or diarrhea. No abdominal pain. No melena or hematochezia.  GENITOURINARY: No dysuria or hematuria.  ENDOCRINE: No polyuria or nocturia. No heat or cold intolerance.  HEMATOLOGY: No anemia. No bruising. No bleeding.  INTEGUMENTARY: No rashes. No lesions.  MUSCULOSKELETAL: No arthritis. No swelling. No gout.  NEUROLOGIC: No numbness, tingling, or ataxia. No seizure-type activity.  PSYCHIATRIC: No anxiety. No insomnia. No ADD.    Vitals:   Vitals:   12/29/16 0600 12/29/16 0800 12/29/16 1000 12/29/16 1153  BP: (!) 96/54 (!) 100/55 113/62 (!) 104/44  Pulse: (!) 50 (!) 54 61 (!) 59  Resp: 19 (!) 21 19 15   Temp:  (!) 96.6 F (35.9 C) 98.7 F (37.1 C) 97.8 F (36.6 C)  TempSrc:  Axillary Oral Oral  SpO2: 95% 94% 95% 95%  Weight:      Height:        Wt Readings from Last 3 Encounters:  12/29/16 125 lb 10.6 oz (57 kg)  09/30/16 118 lb 8 oz (53.8 kg)  09/20/16  119 lb 8 oz (54.2 kg)     Intake/Output Summary (Last 24 hours) at 12/29/16 1215 Last data filed at 12/29/16 1018  Gross per 24 hour  Intake          1235.57 ml  Output             1150 ml  Net            85.57 ml    Physical Exam:   GENERAL: Pleasant-appearing in no apparent distress.  HEAD, EYES, EARS, NOSE AND THROAT: Atraumatic, normocephalic. Extraocular muscles are intact. Pupils  equal and reactive to light. Sclerae anicteric. No conjunctival injection. No oro-pharyngeal erythema.  NECK: Supple. There is no jugular venous distention. No bruits, no lymphadenopathy, no thyromegaly.  HEART: Regular rate and rhythm,. No murmurs, no rubs, no clicks.  LUNGS: Clear to auscultation bilaterally. No rales or rhonchi. No wheezes.  ABDOMEN: Soft, flat, nontender, nondistended. Has good bowel sounds. No hepatosplenomegaly appreciated.  EXTREMITIES: No evidence of any cyanosis, clubbing, or peripheral edema.  +2 pedal and radial pulses bilaterally.  NEUROLOGIC: The patient is alert, awake, and oriented x3 with no focal motor or sensory deficits appreciated bilaterally.  SKIN: Moist and warm with no rashes appreciated.  Psych: Not anxious, depressed LN: No inguinal LN enlargement    Antibiotics   Anti-infectives    Start     Dose/Rate Route Frequency Ordered Stop   12/27/16 1815  vancomycin (VANCOCIN) 50 mg/mL oral solution 250 mg    Comments:  Patient taking differently: Qid every 3 days     250 mg Oral Every 6 hours 12/27/16 1812        Medications   Scheduled Meds: . aspirin EC  325 mg Oral Daily  . atorvastatin  40 mg Oral q1800  . docusate sodium  100 mg Oral BID  . folic acid  1 mg Oral Daily  . gabapentin  400 mg Oral QHS  . vancomycin  250 mg Oral Q6H   Continuous Infusions: . heparin 800 Units/hr (12/29/16 0703)   PRN Meds:.acetaminophen **OR** acetaminophen, ALPRAZolam, bisacodyl, ondansetron **OR** ondansetron (ZOFRAN) IV, traZODone   Data Review:   Micro  Results No results found for this or any previous visit (from the past 240 hour(s)).  Radiology Reports Ct Abdomen Pelvis W Contrast  Result Date: 12/27/2016 CLINICAL DATA:  Epigastric and upper abdominal pain for 1 week. Decreased appetite. EXAM: CT ABDOMEN AND PELVIS WITH CONTRAST TECHNIQUE: Multidetector CT imaging of the abdomen and pelvis was performed using the standard protocol following bolus administration of intravenous contrast. CONTRAST:  89mL ISOVUE-300 IOPAMIDOL (ISOVUE-300) INJECTION 61% COMPARISON:  CT scan 06/05/2014 FINDINGS: Lower chest: The lung bases demonstrate significant breathing motion artifact. There are bibasilar scarring changes but no worrisome lesions or pleural effusion. The heart is enlarged. Three-vessel coronary artery calcifications are noted along with aortic calcifications. The distal esophagus is grossly normal. Small hiatal hernia. Hepatobiliary: No focal hepatic lesions are identified. The liver contour is irregular suggesting changes of cirrhosis. No focal hepatic lesions or intrahepatic biliary dilatation. The gallbladder is surgically absent. No common bile duct dilatation. Pancreas: Enlarging cystic lesion in the Ing pancreatic head measuring 23 x 21 mm. This measured a maximum of 9 mm on the prior study. It is adjacent to the common bile duct but I do not think it is associated with a duct. It has benign imaging features. There is no nodularity or enhancement. I would recommend MRI abdomen/ MRCP without and with contrast for further evaluation. The pancreatic duct is normal. Spleen: Normal size.  No focal lesions. Adrenals/Urinary Tract: The adrenal glands and kidneys are unremarkable. There is a small angiomyolipoma associated with the midpole lower pole junction region of the left kidney. This measures 6 mm and is unchanged. No worrisome renal lesions or hydronephrosis. No obstructing ureteral calculi or bladder calculi. No bladder mass. Stomach/Bowel: Distended  elongated stomach but no lesions or acute inflammation. The duodenum, small bowel and colon are unremarkable. No inflammatory changes, mass lesions or obstructive findings. Moderate sigmoid diverticulosis but no findings for acute diverticulitis. The terminal  ileum is normal. The appendix is not identified for certain. Vascular/Lymphatic: Advanced atherosclerotic calcifications involving the aorta and branch vessels. No aneurysm or dissection. The major venous structures are patent. No mesenteric or retroperitoneal mass or lymphadenopathy. Reproductive: Surgically absent. Other: There is an enlarged, 27 x 17 mm left external iliac lymph node on the left side. This was not present on the prior CT scan. I do not see any other lymphadenopathy typical along with at. There is a single right-sided inguinal lymph node which measures 10 mm which is likely normal. Musculoskeletal: No significant bony findings. Stable degenerative changes involving the spine along with osteoporosis. Chondrocalcinosis noted at the pubic symphysis. Benign-appearing sclerotic lesion in the left inferior pubic ramus is likely a bone island. IMPRESSION: 1. No acute abdominal findings or mass lesions. 2. Enlarging cystic lesion in the pancreatic head. Recommend MRI abdomen/MRCP without with contrast for further evaluation. 3. Enlarged left external iliac lymph node. Given its size biopsy may be indicated. No other lymphadenopathy is demonstrated. 4. Mild cirrhotic changes involving the liver. 5. Advanced atherosclerotic calcifications involving the aorta and branch vessels and also the coronary arteries. Electronically Signed   By: Marijo Sanes M.D.   On: 12/27/2016 15:06   Dg Chest Port 1 View  Result Date: 12/27/2016 CLINICAL DATA:  One week of mid chest pain with increased symptoms over the past 2 days. Also diarrhea. History of non-Hodgkin's lymphoma, hemochromatosis, coronary artery did disease and heart block, C. difficile colitis with  feces transplant. Former smoker. EXAM: PORTABLE CHEST 1 VIEW COMPARISON:  Chest x-ray of January 27, 2013 FINDINGS: The lungs remain well-expanded. The interstitial markings are mildly prominent though stable. There is no alveolar infiltrate, pleural effusion, or pneumothorax. The heart is top-normal in size. The pulmonary vascularity is normal. The permanent pacemaker is in stable position. There is calcification in the wall of the aortic arch. The observed bony thorax exhibits no acute abnormality. IMPRESSION: COPD.  No pneumonia nor CHF nor other acute cardiopulmonary disease. Thoracic aortic atherosclerosis. Electronically Signed   By: David  Martinique M.D.   On: 12/27/2016 12:45     CBC  Recent Labs Lab 12/27/16 1313 12/28/16 0245 12/29/16 0521  WBC 12.0* 9.6 8.2  HGB 14.7 14.8 14.6  HCT 43.2 41.7 41.3  PLT 151 137* 126*  MCV 92.7 90.9 93.0  MCH 31.5 32.3 32.8  MCHC 34.0 35.6 35.3  RDW 13.6 13.5 13.4  LYMPHSABS 1.8  --   --   MONOABS 0.8  --   --   EOSABS 0.0  --   --   BASOSABS 0.1  --   --     Chemistries   Recent Labs Lab 12/27/16 1217 12/28/16 0245  NA 137 136  K 5.1 3.9  CL 104 106  CO2 27 23  GLUCOSE 172* 136*  BUN 16 10  CREATININE 0.80 0.68  CALCIUM 9.2 8.5*  AST 57*  --   ALT 37  --   ALKPHOS 94  --   BILITOT 1.3*  --    ------------------------------------------------------------------------------------------------------------------ estimated creatinine clearance is 39.9 mL/min (by C-G formula based on SCr of 0.68 mg/dL). ------------------------------------------------------------------------------------------------------------------ No results for input(s): HGBA1C in the last 72 hours. ------------------------------------------------------------------------------------------------------------------  Recent Labs  12/29/16 0521  CHOL 155  HDL 33*  LDLCALC 92  TRIG 152*  CHOLHDL 4.7    ------------------------------------------------------------------------------------------------------------------ No results for input(s): TSH, T4TOTAL, T3FREE, THYROIDAB in the last 72 hours.  Invalid input(s): FREET3 ------------------------------------------------------------------------------------------------------------------ No results for input(s): VITAMINB12, FOLATE, FERRITIN,  TIBC, IRON, RETICCTPCT in the last 72 hours.  Coagulation profile  Recent Labs Lab 12/27/16 1217 12/29/16 0521  INR 2.23 1.87    No results for input(s): DDIMER in the last 72 hours.  Cardiac Enzymes  Recent Labs Lab 12/27/16 1615 12/28/16 1135 12/29/16 1009  TROPONINI 13.73* 23.53* 10.93*   ------------------------------------------------------------------------------------------------------------------ Invalid input(s): POCBNP    Assessment & Plan   83 year old female patient with multiple medical problems of essential hypertension, atrial fibrillation, recurrent C. difficile colitis having chest pain and found to have elevated troponins with non-ST elevation MI  #1Non-ST MI Based on the echo likely inferior MI involving RCA branch  Continued therapynitroglycerin discontinued  Patient seen by cardiologyit was decided with cardiology and patient that they will not be pursuing cardiac catheterization continue aspirin   continue lipid lowering regimen  Blood pressures too low to use a beta blocker  We'll ambulate today see how patient is doing possible possible discharging tomorrow  #2. recurrent C. difficile: Patient on suppressive therapy with vancomycin, follows up with Dr. 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 Management plans discussed with the patient, family and they are in agreement.  CODE STATUS: DNR      Code Status Orders        Start      Ordered   12/27/16 1811  Do not attempt resuscitation (DNR)  Continuous    Question Answer Comment  In the event of cardiac or respiratory ARREST Do not call a "code blue"   In the event of cardiac or respiratory ARREST Do not perform Intubation, CPR, defibrillation or ACLS   In the event of cardiac or respiratory ARREST Use medication by any route, position, wound care, and other measures to relive pain and suffering. May use oxygen, suction and manual treatment of airway obstruction as needed for comfort.      12/27/16 1812    Code Status History    Date Active Date Inactive Code Status Order ID Comments User Context   08/28/2016  9:36 PM 09/01/2016  4:56 PM DNR [id number]  Loletha Grayer, MD ED    Advance Directive Documentation   Tye Most Recent Value  Type of Advance Directive  Out of facility DNR (pink MOST or yellow form)  Pre-existing out of facility DNR order (yellow form or pink MOST form)  Yellow form placed in chart (order not valid for inpatient use)  "MOST" Form in Place?  No data           Consults  cardiology   DVT Prophylaxis  heaprin  Lab Results  Component Value Date   PLT 126 (L) 12/29/2016     Time Spent in minutes   86min Greater than 50% of time spent in care coordination and counseling patient regarding the condition and plan of care.   Dustin Flock M.D on 12/29/2016 at 12:15 PM  Between 7am to 6pm - Pager - [phone]  After 6pm go to [email or web address] - password [email or web address] [facility]   Office  [phone]

## 2016-12-29 NOTE — Care Management Note (Addendum)
Case Management Note  Patient Details  Name: [name] MRN: [id number] Date of Birth: 06-Sep-1928  Subjective/Objective:                 Patient transferred from ICU.  Admitted with nstemi and has elected not to pursue cardiac cath.  Sx will be managed medically.  Has been followed by [facility] in the past- closed 11/01/2016. At present, no discharge needs identified by members of the care team. PT consult pending.  No issues accessing medical care, obtaining medications or with transportation.  Current with her PCP.    Action/Plan:   Expected Discharge Date:  12/31/16               Expected Discharge Plan:  Home/Self Care  In-House Referral:     Discharge planning Services  CM Consult  Post Acute Care Choice:    Choice offered to:     DME Arranged:    DME Agency:     HH Arranged:    HH Agency:     Status of Service:  In process, will continue to follow  If discussed at Long Length of Stay Meetings, dates discussed:    Additional Comments:  Katrina Stack, RN 12/29/2016, 4:05 PM

## 2016-12-29 NOTE — Progress Notes (Signed)
PT Cancellation Note  Patient Details Name: [name] MRN: [id number] DOB: July 16, 1928   Cancelled Treatment:    Reason Eval/Treat Not Completed: Medical issues which prohibited therapy (HL subtherapeutic per most recent Pharmacy note).  PT will continue to follow acutely and will complete PT evaluation when pt more medically appropriate.  Thank you for this order.   Collie Siad PT, DPT 12/29/2016, 2:49 PM

## 2016-12-29 NOTE — Progress Notes (Signed)
Patient Name: [name] Date of Encounter: 12/29/2016  Primary Cardiologist: [facility] Problem List     Active Problems:   NSTEMI (non-ST elevated myocardial infarction) [facility])   Pancreatic mass   Coronary artery disease involving native coronary artery of native heart with unstable angina pectoris (HCC)     Subjective   No further chest pain. Troponin > 23 as of 2/28. Level pending this morning. Remains on heparin gtt. Nitro gtt stopped overnight. Coumadin not yet restarted for chronic Afib. PLT 126 this morning. HGB stable. LDL 92. Echo showed EF 50-55% with HK of the inferior and inferolateral myocardium. PASP 33 mmHg.   Inpatient Medications    Scheduled Meds: . aspirin EC  325 mg Oral Daily  . atorvastatin  40 mg Oral q1800  . docusate sodium  100 mg Oral BID  . folic acid  1 mg Oral Daily  . gabapentin  400 mg Oral QHS  . vancomycin  250 mg Oral Q6H   Continuous Infusions: . heparin 800 Units/hr (12/29/16 0703)  . nitroGLYCERIN Stopped (12/29/16 0225)   PRN Meds: acetaminophen **OR** acetaminophen, ALPRAZolam, bisacodyl, ondansetron **OR** ondansetron (ZOFRAN) IV, traZODone   Vital Signs    Vitals:   12/29/16 0500 12/29/16 0600 12/29/16 0800 12/29/16 1000  BP: (!) 102/52 (!) 96/54  113/62  Pulse: (!) 50 (!) 50  61  Resp: 19 19  19   Temp:   (!) 96.6 F (35.9 C) 98.7 F (37.1 C)  TempSrc:   Axillary Oral  SpO2: 94% 95%  95%  Weight: 125 lb 10.6 oz (57 kg)     Height:        Intake/Output Summary (Last 24 hours) at 12/29/16 1027 Last data filed at 12/29/16 1018  Gross per 24 hour  Intake          1235.57 ml  Output             1150 ml  Net            85.57 ml   Filed Weights   12/27/16 1207 12/28/16 0100 12/29/16 0500  Weight: 120 lb (54.4 kg) 121 lb 0.5 oz (54.9 kg) 125 lb 10.6 oz (57 kg)    Physical Exam    GEN: Thin, in no acute distress.  HEENT: Grossly normal.  Neck: Supple, no JVD, carotid bruits, or masses. Cardiac: RRR, no  murmurs, rubs, or gallops. No clubbing, cyanosis, edema.  Radials/DP/PT 2+ and equal bilaterally.  Respiratory:  Respirations regular and unlabored, clear to auscultation bilaterally. GI: Soft, nontender, nondistended, BS + x 4. MS: no deformity or atrophy. Skin: warm and dry, no rash. Neuro:  Strength and sensation are intact. Psych: AAOx3.  Normal affect.  Labs    CBC  Recent Labs  12/27/16 1313 12/28/16 0245 12/29/16 0521  WBC 12.0* 9.6 8.2  NEUTROABS 9.2*  --   --   HGB 14.7 14.8 14.6  HCT 43.2 41.7 41.3  MCV 92.7 90.9 93.0  PLT 151 137* [id number]*   Basic Metabolic Panel  Recent Labs  12/27/16 1217 12/28/16 0245  NA 137 136  K 5.1 3.9  CL 104 106  CO2 27 23  GLUCOSE 172* 136*  BUN 16 10  CREATININE 0.80 0.68  CALCIUM 9.2 8.5*   Liver Function Tests  Recent Labs  12/27/16 1217  AST 57*  ALT 37  ALKPHOS 94  BILITOT 1.3*  PROT 6.7  ALBUMIN 3.9    Recent Labs  12/27/16 1313  LIPASE 34   Cardiac Enzymes  Recent Labs  12/27/16 1217 12/27/16 1615 12/28/16 1135  TROPONINI <0.03 13.73* 23.53*   BNP Invalid input(s): POCBNP D-Dimer No results for input(s): DDIMER in the last 72 hours. Hemoglobin A1C No results for input(s): HGBA1C in the last 72 hours. Fasting Lipid Panel  Recent Labs  12/29/16 0521  CHOL 155  HDL 33*  LDLCALC 92  TRIG 152*  CHOLHDL 4.7   Thyroid Function Tests No results for input(s): TSH, T4TOTAL, T3FREE, THYROIDAB in the last 72 hours.  Invalid input(s): FREET3  Telemetry    V paced, rare PVC - Personally Reviewed  ECG    n/a - Personally Reviewed  Radiology    Ct Abdomen Pelvis W Contrast  Result Date: 12/27/2016 CLINICAL DATA:  Epigastric and upper abdominal pain for 1 week. Decreased appetite. EXAM: CT ABDOMEN AND PELVIS WITH CONTRAST TECHNIQUE: Multidetector CT imaging of the abdomen and pelvis was performed using the standard protocol following bolus administration of intravenous contrast. CONTRAST:   27mL ISOVUE-300 IOPAMIDOL (ISOVUE-300) INJECTION 61% COMPARISON:  CT scan 06/05/2014 FINDINGS: Lower chest: The lung bases demonstrate significant breathing motion artifact. There are bibasilar scarring changes but no worrisome lesions or pleural effusion. The heart is enlarged. Three-vessel coronary artery calcifications are noted along with aortic calcifications. The distal esophagus is grossly normal. Small hiatal hernia. Hepatobiliary: No focal hepatic lesions are identified. The liver contour is irregular suggesting changes of cirrhosis. No focal hepatic lesions or intrahepatic biliary dilatation. The gallbladder is surgically absent. No common bile duct dilatation. Pancreas: Enlarging cystic lesion in the Ing pancreatic head measuring 23 x 21 mm. This measured a maximum of 9 mm on the prior study. It is adjacent to the common bile duct but I do not think it is associated with a duct. It has benign imaging features. There is no nodularity or enhancement. I would recommend MRI abdomen/ MRCP without and with contrast for further evaluation. The pancreatic duct is normal. Spleen: Normal size.  No focal lesions. Adrenals/Urinary Tract: The adrenal glands and kidneys are unremarkable. There is a small angiomyolipoma associated with the midpole lower pole junction region of the left kidney. This measures 6 mm and is unchanged. No worrisome renal lesions or hydronephrosis. No obstructing ureteral calculi or bladder calculi. No bladder mass. Stomach/Bowel: Distended elongated stomach but no lesions or acute inflammation. The duodenum, small bowel and colon are unremarkable. No inflammatory changes, mass lesions or obstructive findings. Moderate sigmoid diverticulosis but no findings for acute diverticulitis. The terminal ileum is normal. The appendix is not identified for certain. Vascular/Lymphatic: Advanced atherosclerotic calcifications involving the aorta and branch vessels. No aneurysm or dissection. The major  venous structures are patent. No mesenteric or retroperitoneal mass or lymphadenopathy. Reproductive: Surgically absent. Other: There is an enlarged, 27 x 17 mm left external iliac lymph node on the left side. This was not present on the prior CT scan. I do not see any other lymphadenopathy typical along with at. There is a single right-sided inguinal lymph node which measures 10 mm which is likely normal. Musculoskeletal: No significant bony findings. Stable degenerative changes involving the spine along with osteoporosis. Chondrocalcinosis noted at the pubic symphysis. Benign-appearing sclerotic lesion in the left inferior pubic ramus is likely a bone island. IMPRESSION: 1. No acute abdominal findings or mass lesions. 2. Enlarging cystic lesion in the pancreatic head. Recommend MRI abdomen/MRCP without with contrast for further evaluation. 3. Enlarged left external iliac lymph node. Given its size biopsy may be  indicated. No other lymphadenopathy is demonstrated. 4. Mild cirrhotic changes involving the liver. 5. Advanced atherosclerotic calcifications involving the aorta and branch vessels and also the coronary arteries. Electronically Signed   By: Marijo Sanes M.D.   On: 12/27/2016 15:06   Dg Chest Port 1 View  Result Date: 12/27/2016 CLINICAL DATA:  One week of mid chest pain with increased symptoms over the past 2 days. Also diarrhea. History of non-Hodgkin's lymphoma, hemochromatosis, coronary artery did disease and heart block, C. difficile colitis with feces transplant. Former smoker. EXAM: PORTABLE CHEST 1 VIEW COMPARISON:  Chest x-ray of January 27, 2013 FINDINGS: The lungs remain well-expanded. The interstitial markings are mildly prominent though stable. There is no alveolar infiltrate, pleural effusion, or pneumothorax. The heart is top-normal in size. The pulmonary vascularity is normal. The permanent pacemaker is in stable position. There is calcification in the wall of the aortic arch. The  observed bony thorax exhibits no acute abnormality. IMPRESSION: COPD.  No pneumonia nor CHF nor other acute cardiopulmonary disease. Thoracic aortic atherosclerosis. Electronically Signed   By: David  Martinique M.D.   On: 12/27/2016 12:45    Cardiac Studies   TTE 12/28/16: Study Conclusions  - Left ventricle: The cavity size was normal. There was mild   concentric hypertrophy. Systolic function was normal. The   estimated ejection fraction was in the range of 50% to 55%.   Hypokinesis of the inferior myocardium. Hypokinesis of the   inferolateral myocardium. The study is not technically sufficient   to allow evaluation of LV diastolic function. Paced rhythm. - Mitral valve: Calcified annulus. There was moderate   regurgitation. - Left atrium: The atrium was mildly dilated. - Right ventricle: Systolic function was normal. - Pulmonary arteries: PA peak pressure: 33 mm Hg (S).  Patient Profile     83 y.o. female with history of permanent atrial fibrillation on warfarin s/p AV node ablation an pacemaker placement, prior history of non-obstructive CAD, hypertension, hyperlipidemia, recurrent C. difficile colitis, non-hodgkin's lymphoma, hereditary hemochromatosis, and pancreatic mass who presented to [facility] on 2/27 with NSTEMI, medically managed.   Assessment & Plan    1. NSTEMI -Currently chest pain-free -Troponin of > 23 on 2/28, current level pending to evaluate for peak and downward trend -Remains on heparin gtt, would continue throughout this evening for a total of 48 hours -Discussed with Dr. Fletcher Anon, will avoid Plavix given Coumadin. She is a frail elderly lady and she is concerned about being on both of these medications -Will treat with ASA 81 mg for 1-2 months with Coumadin, then can possible stop ASA -She is s/p PPM and is currently pacing at 60 bpm, though BP has been soft in the [id number] systolic range, currently in [id number] systolic. Because of hypotension will avoid beta blocker for now.  Would attempt to start when able -Echo with mildly reduced EF as above with HK if inferior and inferolateral wall -If she has recurrence of angina could reconsider invasive evaluation, for now she wishes to avoid this  2. ICM: -She does not appear volume overloaded -Not on beta blocker as above  3. Chronic Afib s/p AVN ablation : -Currently, V paced in the 60's bpm -Would look to restart Coumadin today, will consult pharmacy, will need heparin to Coumadin bridge until therapeutic INR -Not on rate control medication as above  4. HTN: -BP soft -Antihypertensives held  Signed, Marcille Blanco [place] Pager: [phone] 12/29/2016, 10:27 AM

## 2016-12-29 NOTE — Progress Notes (Signed)
This patient has remained pain free throughout this writer's shift. She slept the majority of it. NTG gtt was turned off at 0225 hours d/t pt's SBP being in the 80's. SBP has remained in the 90's-102 since it was d/c'd. Ventricular paced. Patient voices that she can't wait to get home, in good spirits.

## 2016-12-29 NOTE — Plan of Care (Signed)
Problem: Safety: Goal: Ability to remain free from injury will improve Outcome: Progressing Fall precautions in place  Problem: Pain Managment: Goal: General experience of comfort will improve Outcome: Progressing Prn medications  Problem: Tissue Perfusion: Goal: Risk factors for ineffective tissue perfusion will decrease Outcome: Progressing Heparin gtt infusing  Problem: Activity: Goal: Ability to tolerate increased activity will improve Outcome: Progressing PT eval pending  Problem: Cardiac: Goal: Ability to achieve and maintain adequate cardiopulmonary perfusion will improve Outcome: Progressing EF 50-55%

## 2016-12-29 NOTE — Progress Notes (Signed)
Pt transferred from ICU to 240.  A&O x 3, no distress on 2L O2 per [place].  Cardiac monitor placed on pt and verified with [facility]. Pt denies chest pain at this time.  Lungs clear bil. Heparin gtt infusing at 8mg /hr rt fa.  Oriented to room and surroundings.  Skin intact.  CB in reach, SR up x 2.

## 2016-12-29 NOTE — Progress Notes (Signed)
Patient transferrred to room 240. No complications during transfer.

## 2016-12-29 NOTE — Progress Notes (Signed)
ANTICOAGULATION CONSULT NOTE -  Pharmacy Consult for Heparin  Indication: chest pain/ACS  Allergies  Allergen Reactions  . Amoxicillin   . Cefdinir Diarrhea  . Cilostazol     Other reaction(s): Headache  . Levofloxacin   . Plavix [Clopidogrel Bisulfate]     12/12/11 patient reports she has never taken Plavix before.   . Sulfur Nausea Only    Patient Measurements: Height: 5\' 2"  (157.5 cm) Weight: 125 lb 10.6 oz (57 kg) IBW/kg (Calculated) : 50.1 Heparin Dosing Weight: 54  Vital Signs: Temp: 97.8 F (36.6 C) (03/01 1153) Temp Source: Oral (03/01 1153) BP: 104/44 (03/01 1153) Pulse Rate: 59 (03/01 1153)  Labs:  Recent Labs  12/27/16 1217  12/27/16 1313 12/27/16 1615  12/28/16 0245 12/28/16 1135 12/29/16 0521 12/29/16 1009 12/29/16 1529  HGB  --   < > 14.7  --   --  14.8  --  14.6  --   --   HCT  --   --  43.2  --   --  41.7  --  41.3  --   --   PLT  --   --  151  --   --  137*  --  126*  --   --   LABPROT 25.1*  --   --   --   --   --   --  21.8*  --   --   INR 2.23  --   --   --   --   --   --  1.87  --   --   HEPARINUNFRC  --   --   --   --   < > 0.34 0.30 0.28*  --  0.42  CREATININE 0.80  --   --   --   --  0.68  --   --   --   --   TROPONINI <0.03  --   --  13.73*  --   --  23.53*  --  10.93*  --   < > = values in this interval not displayed.  Estimated Creatinine Clearance: 39.9 mL/min (by C-G formula based on SCr of 0.68 mg/dL).   Medical History: Past Medical History:  Diagnosis Date  . Atrial fibrillation ([place]) 07/20/2009   Qualifier: Diagnosis of  By: Caryl Comes, MD, Leonidas Romberg Mack Guise   . C. difficile colitis    feces transplant  . Complete heart block  s/p av ablation    with atrial fibrillation. Pt is s/p AV nodal ablation due to permanent and symptomatic atrial fibrillation.   . Coronary artery disease    Nonobstructive  . Diverticulitis   . GERD (gastroesophageal reflux disease)   . Hereditary hemochromatosis ([place])    Phlebotomized in the  past, not getting phlebotomy now  . Hyperlipidemia   . Hypertension   . Non Hodgkin's lymphoma ([place])    stable; followed by an oncologist  . Osteoarthritis   . 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 Pacemaker-dual-chamber-Medtronic 04/04/2011    Medications:  Prescriptions Prior to Admission  Medication Sig Dispense Refill Last Dose  . ALPRAZolam (XANAX) 0.25 MG tablet Take 0.5 mg by mouth at bedtime as needed for sleep.   12/26/2016 at pm  . amLODipine (NORVASC) 5 MG tablet TAKE ONE TABLET BY MOUTH ONCE DAILY   12/26/2016 at pm  . Cetirizine HCl (ZYRTEC ALLERGY PO) Take by mouth daily.    12/26/2016 at pm  . fluticasone (FLONASE) 50 MCG/ACT nasal spray Place 2 sprays into the  nose daily as needed for allergies.    [id number] at pm  . folic acid (FOLVITE) 1 MG tablet Take 1 mg by mouth daily.     12/26/2016 at pm  . gabapentin (NEURONTIN) 400 MG capsule Take 400 mg by mouth at bedtime.    12/26/2016 at pm  . meclizine (ANTIVERT) 25 MG tablet Take 1 tablet (25 mg total) by mouth 3 (three) times daily as needed. 30 tablet 0 prn at prn  . polyvinyl alcohol (LIQUIFILM TEARS) 1.4 % ophthalmic solution Place 2 drops into both eyes 3 (three) times daily as needed (for dry eyes).   prn at prn  . vancomycin (VANCOCIN) 50 mg/mL oral solution Take 5 mLs (250 mg total) by mouth every 6 (six) hours. (Patient taking differently: Take 250 mg by mouth every 6 (six) hours. Qid every 3 days) 240 mL 0 12/27/2016 at am  . warfarin (COUMADIN) 2 MG tablet TAKE ONE TABLET BY MOUTH ONCE DAILY AS DIRECTED (Patient taking differently: TAKE ONE TABLET BY MOUTH ONCE DAILY) 30 tablet 3 12/26/2016 at 1800  . gabapentin (NEURONTIN) 100 MG capsule Take 100 mg by mouth. Takes 1 tablet in the am daily.   Not Taking  . warfarin (COUMADIN) 3 MG tablet Take as directed by coumadin clinic (Patient not taking: Reported on 12/27/2016) 20 tablet 3 Not Taking at Unknown time   Scheduled:  . aspirin EC  325 mg Oral Daily  . atorvastatin  40 mg Oral  q1800  . docusate sodium  100 mg Oral BID  . folic acid  1 mg Oral Daily  . gabapentin  400 mg Oral QHS  . vancomycin  250 mg Oral Q6H  . warfarin  2 mg Oral q1800  . Warfarin - Pharmacist Dosing Inpatient   Does not apply q1800    Assessment: Pharmacy consulted to dose and monitor heparin in this 83 year old woman being treated for ACS. Patient is also on warfarin as an outpatient.   Goal of Therapy:  Heparin level 0.3-0.7 units/ml Monitor platelets by anticoagulation protocol: Yes   Plan:  INR therapeutic on admission but last dose of warfarin was 2/26 and troponin continues to increase. Will increase heparin drip slightly as HL is at the low end of goal range. Will f/u cardiology plans for cath and duration of heparin therapy as well as plans to resume warfarin. Will recheck a HL and PT/INR in AM.   3/1 0521 HL subtherapeutic x 1. 800 units IV x 1 bolus and increase rate to 800 units/hr. Will recheck HL in 8 hours.   3/1:  HL @ 1529= 0.42.  Will continue current rate and check confirmatory level in 8 hrs. Note: patient restarted on Warfarin.   Noralee Space, PharmD, BCPS Clinical Pharmacist 12/29/2016,4:15 PM

## 2016-12-29 NOTE — Progress Notes (Signed)
ANTICOAGULATION CONSULT NOTE - Initial Consult  Pharmacy Consult for Heparin  Indication: chest pain/ACS  Allergies  Allergen Reactions  . Amoxicillin   . Cefdinir Diarrhea  . Cilostazol     Other reaction(s): Headache  . Levofloxacin   . Plavix [Clopidogrel Bisulfate]     12/12/11 patient reports she has never taken Plavix before.   . Sulfur Nausea Only    Patient Measurements: Height: 5\' 2"  (157.5 cm) Weight: 125 lb 10.6 oz (57 kg) IBW/kg (Calculated) : 50.1 Heparin Dosing Weight: 54  Vital Signs: Temp: 98.2 F (36.8 C) (03/01 0200) Temp Source: Oral (03/01 0200) BP: 96/54 (03/01 0600) Pulse Rate: 50 (03/01 0600)  Labs:  Recent Labs  12/27/16 1217  12/27/16 1313 12/27/16 1615 12/28/16 0245 12/28/16 1135 12/29/16 0521  HGB  --   < > 14.7  --  14.8  --  14.6  HCT  --   --  43.2  --  41.7  --  41.3  PLT  --   --  151  --  137*  --  126*  LABPROT 25.1*  --   --   --   --   --  21.8*  INR 2.23  --   --   --   --   --  1.87  HEPARINUNFRC  --   --   --   --  0.34 0.30 0.28*  CREATININE 0.80  --   --   --  0.68  --   --   TROPONINI <0.03  --   --  13.73*  --  23.53*  --   < > = values in this interval not displayed.  Estimated Creatinine Clearance: 39.9 mL/min (by C-G formula based on SCr of 0.68 mg/dL).   Medical History: Past Medical History:  Diagnosis Date  . Atrial fibrillation ([place]) 07/20/2009   Qualifier: Diagnosis of  By: Caryl Comes, MD, Leonidas Romberg Mack Guise   . C. difficile colitis    feces transplant  . Complete heart block  s/p av ablation    with atrial fibrillation. Pt is s/p AV nodal ablation due to permanent and symptomatic atrial fibrillation.   . Coronary artery disease    Nonobstructive  . Diverticulitis   . GERD (gastroesophageal reflux disease)   . Hereditary hemochromatosis ([place])    Phlebotomized in the past, not getting phlebotomy now  . Hyperlipidemia   . Hypertension   . Non Hodgkin's lymphoma ([place])    stable; followed by an oncologist   . Osteoarthritis   . 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 Pacemaker-dual-chamber-Medtronic 04/04/2011    Medications:  Prescriptions Prior to Admission  Medication Sig Dispense Refill Last Dose  . ALPRAZolam (XANAX) 0.25 MG tablet Take 0.5 mg by mouth at bedtime as needed for sleep.   12/26/2016 at pm  . amLODipine (NORVASC) 5 MG tablet TAKE ONE TABLET BY MOUTH ONCE DAILY   12/26/2016 at pm  . Cetirizine HCl (ZYRTEC ALLERGY PO) Take by mouth daily.    12/26/2016 at pm  . fluticasone (FLONASE) 50 MCG/ACT nasal spray Place 2 sprays into the nose daily as needed for allergies.    [id number] at pm  . folic acid (FOLVITE) 1 MG tablet Take 1 mg by mouth daily.     12/26/2016 at pm  . gabapentin (NEURONTIN) 400 MG capsule Take 400 mg by mouth at bedtime.    12/26/2016 at pm  . meclizine (ANTIVERT) 25 MG tablet Take 1 tablet (25 mg total) by mouth 3 (three) times  daily as needed. 30 tablet 0 prn at prn  . polyvinyl alcohol (LIQUIFILM TEARS) 1.4 % ophthalmic solution Place 2 drops into both eyes 3 (three) times daily as needed (for dry eyes).   prn at prn  . vancomycin (VANCOCIN) 50 mg/mL oral solution Take 5 mLs (250 mg total) by mouth every 6 (six) hours. (Patient taking differently: Take 250 mg by mouth every 6 (six) hours. Qid every 3 days) 240 mL 0 12/27/2016 at am  . warfarin (COUMADIN) 2 MG tablet TAKE ONE TABLET BY MOUTH ONCE DAILY AS DIRECTED (Patient taking differently: TAKE ONE TABLET BY MOUTH ONCE DAILY) 30 tablet 3 12/26/2016 at 1800  . gabapentin (NEURONTIN) 100 MG capsule Take 100 mg by mouth. Takes 1 tablet in the am daily.   Not Taking  . warfarin (COUMADIN) 3 MG tablet Take as directed by coumadin clinic (Patient not taking: Reported on 12/27/2016) 20 tablet 3 Not Taking at Unknown time   Scheduled:  . aspirin EC  325 mg Oral Daily  . atorvastatin  40 mg Oral q1800  . docusate sodium  100 mg Oral BID  . folic acid  1 mg Oral Daily  . gabapentin  400 mg Oral QHS  . vancomycin  250 mg Oral Q6H     Assessment: Pharmacy consulted to dose and monitor heparin in this 83 year old woman being treated for ACS. Patient is also on warfarin as an outpatient.   Goal of Therapy:  Heparin level 0.3-0.7 units/ml Monitor platelets by anticoagulation protocol: Yes   Plan:  INR therapeutic on admission but last dose of warfarin was 2/26 and troponin continues to increase. Will increase heparin drip slightly as HL is at the low end of goal range. Will f/u cardiology plans for cath and duration of heparin therapy as well as plans to resume warfarin. Will recheck a HL and PT/INR in AM.   3/1 0521 HL subtherapeutic x 1. 800 units IV x 1 bolus and increase rate to 800 units/hr. Will recheck HL in 8 hours.   Laural Benes, PharmD, BCPS Clinical Pharmacist 12/29/2016,6:49 AM

## 2016-12-30 LAB — HEPARIN LEVEL (UNFRACTIONATED)
HEPARIN UNFRACTIONATED: 0.62 [IU]/mL (ref 0.30–0.70)
HEPARIN UNFRACTIONATED: 0.29 [IU]/mL — AB (ref 0.30–0.70)
Heparin Unfractionated: 0.65 IU/mL (ref 0.30–0.70)

## 2016-12-30 LAB — CBC
HEMATOCRIT: 42.1 % (ref 35.0–47.0)
Hemoglobin: 14.3 g/dL (ref 12.0–16.0)
MCH: 31.6 pg (ref 26.0–34.0)
MCHC: 33.9 g/dL (ref 32.0–36.0)
MCV: 93.4 fL (ref 80.0–100.0)
PLATELETS: 129 10*3/uL — AB (ref 150–440)
RBC: 4.51 MIL/uL (ref 3.80–5.20)
RDW: 13.6 % (ref 11.5–14.5)
WBC: 7.7 10*3/uL (ref 3.6–11.0)

## 2016-12-30 LAB — GLUCOSE, CAPILLARY: Glucose-Capillary: 121 mg/dL — ABNORMAL HIGH (ref 65–99)

## 2016-12-30 LAB — CA 19-9 (SERIAL): CA 19 9: 34 U/mL (ref 0–35)

## 2016-12-30 LAB — PROTIME-INR
INR: 1.68
Prothrombin Time: 20 seconds — ABNORMAL HIGH (ref 11.4–15.2)

## 2016-12-30 MED ORDER — WARFARIN SODIUM 3 MG PO TABS
3.0000 mg | ORAL_TABLET | Freq: Once | ORAL | Status: AC
  Administered 2016-12-30: 3 mg via ORAL
  Filled 2016-12-30: qty 1

## 2016-12-30 MED ORDER — HEPARIN BOLUS VIA INFUSION
800.0000 [IU] | Freq: Once | INTRAVENOUS | Status: AC
  Administered 2016-12-30: 800 [IU] via INTRAVENOUS
  Filled 2016-12-30: qty 800

## 2016-12-30 MED ORDER — ASPIRIN EC 81 MG PO TBEC
81.0000 mg | DELAYED_RELEASE_TABLET | Freq: Every day | ORAL | Status: DC
  Administered 2016-12-30 – 2016-12-31 (×2): 81 mg via ORAL
  Filled 2016-12-30 (×2): qty 1

## 2016-12-30 NOTE — Evaluation (Signed)
Physical Therapy Evaluation Patient Details Name: [name] MRN: [id number] DOB: May 16, 1928 Today's Date: 12/30/2016   History of Present Illness  Pt is a 83 y/o F who presented with chest pain and found to have elevated troponins with NSTEMI.  Pt's PMH includes a-fib, recurrent C-diff, permanent pacemaker, Non Hodgkin's lympohoma.      Clinical Impression  Pt admitted with above diagnosis. Pt was Ind PTA and was independent with all aspects of mobility today. Pt scored 24/24 on the DGI indicating pt is not at a risk of falling.  Results explained to the patient.  No PT needs identified, PT will sign off.     Follow Up Recommendations No PT follow up    Equipment Recommendations  None recommended by PT    Recommendations for Other Services       Precautions / Restrictions Precautions Precautions: None Restrictions Weight Bearing Restrictions: No      Mobility  Bed Mobility Overal bed mobility: Independent             General bed mobility comments: No cues or physical assist needed for sit>supine.  Pt independent.  Transfers Overall transfer level: Independent Equipment used: None Transfers: Sit to/from Stand Sit to Stand: Independent         General transfer comment: No cues or physical asssist.  Pt performs independently without instability.  Ambulation/Gait Ambulation/Gait assistance: Independent Ambulation Distance (Feet): 250 Feet Assistive device: None Gait Pattern/deviations: WFL(Within Functional Limits)   Gait velocity interpretation: at or above normal speed for age/gender General Gait Details: Cues for upright posture and forward gaze.  Otherwise pt independent with no signs of instability.    Stairs Stairs: Yes Stairs assistance: Independent Stair Management: No rails;Alternating pattern;Forwards Number of Stairs: 2 General stair comments: No assist or cues needed.  Pt independent without use of railing.  Wheelchair Mobility     Modified Rankin (Stroke Patients Only)       Balance Overall balance assessment: Independent                               Standardized Balance Assessment Standardized Balance Assessment : Dynamic Gait Index   Dynamic Gait Index Level Surface: Normal Change in Gait Speed: Normal Gait with Horizontal Head Turns: Normal Gait with Vertical Head Turns: Normal Gait and Pivot Turn: Normal Step Over Obstacle: Normal Step Around Obstacles: Normal Steps: Normal Total Score: 24       Pertinent Vitals/Pain Pain Assessment: No/denies pain    Home Living Family/patient expects to be discharged to:: Private residence Living Arrangements: Alone Available Help at Discharge: Family;Available PRN/intermittently Type of Home: House Home Access: Stairs to enter Entrance Stairs-Rails: None Entrance Stairs-Number of Steps: 2 Home Layout: One level Home Equipment: Walker - 2 wheels;Walker - 4 wheels;Cane - single point      Prior Function Level of Independence: Independent         Comments: Independent with ADLs/IADLs.  Family brings pt food time to time.  Pt does some cooking on her own but also receives Meals on Wheels.  She denies any falls in the past year.       Hand Dominance   Dominant Hand: Left    Extremity/Trunk Assessment   Upper Extremity Assessment Upper Extremity Assessment: Overall WFL for tasks assessed    Lower Extremity Assessment Lower Extremity Assessment: Overall WFL for tasks assessed    Cervical / Trunk Assessment Cervical / Trunk Assessment: Kyphotic  Communication   Communication: HOH  Cognition Arousal/Alertness: Awake/alert Behavior During Therapy: WFL for tasks assessed/performed Overall Cognitive Status: Within Functional Limits for tasks assessed                      General Comments General comments (skin integrity, edema, etc.): Pt scored 24/24 on the DGI indicating pt is not at a risk of falling.  Results  explained to the patient.    Exercises     Assessment/Plan    PT Assessment Patent does not need any further PT services  PT Problem List         PT Treatment Interventions      PT Goals (Current goals can be found in the Care Plan section)  Acute Rehab PT Goals Patient Stated Goal: to go home and get back to her routine PT Goal Formulation: All assessment and education complete, DC therapy    Frequency     Barriers to discharge        Co-evaluation               End of Session Equipment Utilized During Treatment: Gait belt Activity Tolerance: Patient tolerated treatment well Patient left: in bed;with call bell/phone within reach;with bed alarm set (bed alarm set as pt still with IV) Nurse Communication: Mobility status PT Visit Diagnosis: Unsteadiness on feet (R26.81)         Time: [id number] PT Time Calculation (min) (ACUTE ONLY): 19 min   Charges:   PT Evaluation $PT Eval Low Complexity: 1 Procedure     PT G CodesCollie Siad PT, DPT 12/30/2016, 1:57 PM

## 2016-12-30 NOTE — Progress Notes (Signed)
ANTICOAGULATION CONSULT NOTE -  Pharmacy Consult for Heparin  Indication: chest pain/ACS  Allergies  Allergen Reactions  . Amoxicillin   . Cefdinir Diarrhea  . Cilostazol     Other reaction(s): Headache  . Levofloxacin   . Plavix [Clopidogrel Bisulfate]     12/12/11 patient reports she has never taken Plavix before.   . Sulfur Nausea Only    Patient Measurements: Height: 5\' 2"  (157.5 cm) Weight: 125 lb 11.2 oz (57 kg) IBW/kg (Calculated) : 50.1 Heparin Dosing Weight: 54  Vital Signs: Temp: 98.3 F (36.8 C) (03/02 0610) Temp Source: Oral (03/02 0610) BP: 105/55 (03/02 1244) Pulse Rate: 59 (03/02 1244)  Labs:  Recent Labs  12/28/16 0245 12/28/16 1135 12/29/16 0521 12/29/16 1009  12/29/16 2350 12/30/16 0625 12/30/16 1340  HGB 14.8  --  14.6  --   --   --  14.3  --   HCT 41.7  --  41.3  --   --   --  42.1  --   PLT 137*  --  126*  --   --   --  129*  --   LABPROT  --   --  21.8*  --   --   --  20.0*  --   INR  --   --  1.87  --   --   --  1.68  --   HEPARINUNFRC 0.34 0.30 0.28*  --   < > 0.29* 0.65 0.62  CREATININE 0.68  --   --   --   --   --   --   --   TROPONINI  --  23.53*  --  10.93*  --   --   --   --   < > = values in this interval not displayed.  Estimated Creatinine Clearance: 39.9 mL/min (by C-G formula based on SCr of 0.68 mg/dL).   Medical History: Past Medical History:  Diagnosis Date  . Atrial fibrillation ([place]) 07/20/2009   Qualifier: Diagnosis of  By: Caryl Comes, MD, Leonidas Romberg Mack Guise   . C. difficile colitis    feces transplant  . Complete heart block  s/p av ablation    with atrial fibrillation. Pt is s/p AV nodal ablation due to permanent and symptomatic atrial fibrillation.   . Coronary artery disease    Nonobstructive  . Diverticulitis   . GERD (gastroesophageal reflux disease)   . Hereditary hemochromatosis ([place])    Phlebotomized in the past, not getting phlebotomy now  . Hyperlipidemia   . Hypertension   . Non Hodgkin's lymphoma  ([place])    stable; followed by an oncologist  . Osteoarthritis   . 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 Pacemaker-dual-chamber-Medtronic 04/04/2011    Medications:  Prescriptions Prior to Admission  Medication Sig Dispense Refill Last Dose  . ALPRAZolam (XANAX) 0.25 MG tablet Take 0.5 mg by mouth at bedtime as needed for sleep.   12/26/2016 at pm  . amLODipine (NORVASC) 5 MG tablet TAKE ONE TABLET BY MOUTH ONCE DAILY   12/26/2016 at pm  . Cetirizine HCl (ZYRTEC ALLERGY PO) Take by mouth daily.    12/26/2016 at pm  . fluticasone (FLONASE) 50 MCG/ACT nasal spray Place 2 sprays into the nose daily as needed for allergies.    [id number] at pm  . folic acid (FOLVITE) 1 MG tablet Take 1 mg by mouth daily.     12/26/2016 at pm  . gabapentin (NEURONTIN) 400 MG capsule Take 400 mg by mouth at bedtime.  12/26/2016 at pm  . meclizine (ANTIVERT) 25 MG tablet Take 1 tablet (25 mg total) by mouth 3 (three) times daily as needed. 30 tablet 0 prn at prn  . polyvinyl alcohol (LIQUIFILM TEARS) 1.4 % ophthalmic solution Place 2 drops into both eyes 3 (three) times daily as needed (for dry eyes).   prn at prn  . vancomycin (VANCOCIN) 50 mg/mL oral solution Take 5 mLs (250 mg total) by mouth every 6 (six) hours. (Patient taking differently: Take 250 mg by mouth every 6 (six) hours. Qid every 3 days) 240 mL 0 12/27/2016 at am  . warfarin (COUMADIN) 2 MG tablet TAKE ONE TABLET BY MOUTH ONCE DAILY AS DIRECTED (Patient taking differently: TAKE ONE TABLET BY MOUTH ONCE DAILY) 30 tablet 3 12/26/2016 at 1800  . gabapentin (NEURONTIN) 100 MG capsule Take 100 mg by mouth. Takes 1 tablet in the am daily.   Not Taking  . warfarin (COUMADIN) 3 MG tablet Take as directed by coumadin clinic (Patient not taking: Reported on 12/27/2016) 20 tablet 3 Not Taking at Unknown time   Scheduled:  . aspirin EC  81 mg Oral Daily  . atorvastatin  40 mg Oral q1800  . docusate sodium  100 mg Oral BID  . folic acid  1 mg Oral Daily  . gabapentin  400 mg  Oral QHS  . vancomycin  250 mg Oral Q6H  . warfarin  3 mg Oral ONCE-1800  . Warfarin - Pharmacist Dosing Inpatient   Does not apply q1800    Assessment: Pharmacy consulted to dose and monitor heparin in this 83 year old woman being treated for ACS. Patient is also on warfarin as an outpatient.   Goal of Therapy:  Heparin level 0.3-0.7 units/ml Monitor platelets by anticoagulation protocol: Yes   Plan:  INR therapeutic on admission but last dose of warfarin was 2/26 and troponin continues to increase. Will increase heparin drip slightly as HL is at the low end of goal range. Will f/u cardiology plans for cath and duration of heparin therapy as well as plans to resume warfarin. Will recheck a HL and PT/INR in AM.   3/1 0521 HL subtherapeutic x 1. 800 units IV x 1 bolus and increase rate to 800 units/hr. Will recheck HL in 8 hours.   3/1:  HL @ 1529= 0.42.  Will continue current rate and check confirmatory level in 8 hrs. Note: patient restarted on Warfarin.  3/1 2350 HL subtherapeutic x 1. 800 units IV x 1 bolus and increase rate to 900 units/hr. Will recheck HL in 8 hours.  3/2: HL @ 0625= 0.65. Note this was ordered for 0900 so it is 2.5 hrs early. Will continue current rate of 900 unts/hr and check confirmatory level in 8 hrs.   3/2: HL @ 1340= 0.62.  Will continue current rate and recheck in am. Patient on Warfarin- bridging till INR therapeutic   Noralee Space, PharmD, BCPS Clinical Pharmacist 12/30/2016,4:19 PM

## 2016-12-30 NOTE — Progress Notes (Signed)
Patient Name: [name] Date of Encounter: 12/30/2016  Primary Cardiologist: [facility] Problem List     Active Problems:   NSTEMI (non-ST elevated myocardial infarction) [facility])   Pancreatic mass   Coronary artery disease involving native coronary artery of native heart with unstable angina pectoris ([place])     Subjective   She is doing well with no chest pain or shortness of breath.  Echo showed EF 50-55% with HK of the inferior and inferolateral myocardium. PASP 33 mmHg.   Inpatient Medications    Scheduled Meds: . aspirin EC  81 mg Oral Daily  . atorvastatin  40 mg Oral q1800  . docusate sodium  100 mg Oral BID  . folic acid  1 mg Oral Daily  . gabapentin  400 mg Oral QHS  . vancomycin  250 mg Oral Q6H  . warfarin  3 mg Oral ONCE-1800  . Warfarin - Pharmacist Dosing Inpatient   Does not apply q1800   Continuous Infusions: . heparin 900 Units/hr (12/30/16 0110)   PRN Meds: acetaminophen **OR** acetaminophen, ALPRAZolam, bisacodyl, ondansetron **OR** ondansetron (ZOFRAN) IV, traZODone   Vital Signs    Vitals:   12/29/16 1000 12/29/16 1153 12/29/16 1947 12/30/16 0610  BP: 113/62 (!) 104/44 115/60 (!) 100/53  Pulse: 61 (!) 59 (!) 59 (!) 50  Resp: 19 15 16 18   Temp: 98.7 F (37.1 C) 97.8 F (36.6 C) 98 F (36.7 C) 98.3 F (36.8 C)  TempSrc: Oral Oral Oral Oral  SpO2: 95% 95% 97% 95%  Weight:    125 lb 11.2 oz (57 kg)  Height:        Intake/Output Summary (Last 24 hours) at 12/30/16 0808 Last data filed at 12/30/16 0759  Gross per 24 hour  Intake           992.27 ml  Output             1300 ml  Net          -307.73 ml   Filed Weights   12/28/16 0100 12/29/16 0500 12/30/16 0610  Weight: 121 lb 0.5 oz (54.9 kg) 125 lb 10.6 oz (57 kg) 125 lb 11.2 oz (57 kg)    Physical Exam    GEN: Thin, in no acute distress.  HEENT: Grossly normal.  Neck: Supple, no JVD, carotid bruits, or masses. Cardiac: RRR, no murmurs, rubs, or gallops. No clubbing,  cyanosis, edema.  Radials/DP/PT 2+ and equal bilaterally.  Respiratory:  Respirations regular and unlabored, clear to auscultation bilaterally. GI: Soft, nontender, nondistended, BS + x 4. MS: no deformity or atrophy. Skin: warm and dry, no rash. Neuro:  Strength and sensation are intact. Psych: AAOx3.  Normal affect.  Labs    CBC  Recent Labs  12/27/16 1313  12/29/16 0521 12/30/16 0625  WBC 12.0*  < > 8.2 7.7  NEUTROABS 9.2*  --   --   --   HGB 14.7  < > 14.6 14.3  HCT 43.2  < > 41.3 42.1  MCV 92.7  < > 93.0 93.4  PLT 151  < > 126* 129*  < > = values in this interval not displayed. Basic Metabolic Panel  Recent Labs  12/27/16 1217 12/28/16 0245  NA 137 136  K 5.1 3.9  CL 104 106  CO2 27 23  GLUCOSE 172* 136*  BUN 16 10  CREATININE 0.80 0.68  CALCIUM 9.2 8.5*   Liver Function Tests  Recent Labs  12/27/16 1217  AST 57*  ALT 37  ALKPHOS 94  BILITOT 1.3*  PROT 6.7  ALBUMIN 3.9    Recent Labs  12/27/16 1313  LIPASE 34   Cardiac Enzymes  Recent Labs  12/27/16 1615 12/28/16 1135 12/29/16 1009  TROPONINI 13.73* 23.53* 10.93*   BNP Invalid input(s): POCBNP D-Dimer No results for input(s): DDIMER in the last 72 hours. Hemoglobin A1C No results for input(s): HGBA1C in the last 72 hours. Fasting Lipid Panel  Recent Labs  12/29/16 0521  CHOL 155  HDL 33*  LDLCALC 92  TRIG 152*  CHOLHDL 4.7   Thyroid Function Tests No results for input(s): TSH, T4TOTAL, T3FREE, THYROIDAB in the last 72 hours.  Invalid input(s): FREET3  Telemetry    Ventricular paced rhythm - Personally Reviewed  ECG    n/a - Personally Reviewed  Radiology    No results found.  Cardiac Studies   TTE 12/28/16: Study Conclusions  - Left ventricle: The cavity size was normal. There was mild   concentric hypertrophy. Systolic function was normal. The   estimated ejection fraction was in the range of 50% to 55%.   Hypokinesis of the inferior myocardium.  Hypokinesis of the   inferolateral myocardium. The study is not technically sufficient   to allow evaluation of LV diastolic function. Paced rhythm. - Mitral valve: Calcified annulus. There was moderate   regurgitation. - Left atrium: The atrium was mildly dilated. - Right ventricle: Systolic function was normal. - Pulmonary arteries: PA peak pressure: 33 mm Hg (S).  Patient Profile     83 y.o. female with history of permanent atrial fibrillation on warfarin s/p AV node ablation an pacemaker placement, prior history of non-obstructive CAD, hypertension, hyperlipidemia, recurrent C. difficile colitis, non-hodgkin's lymphoma, hereditary hemochromatosis, and pancreatic mass who presented to [facility] on 2/27 with NSTEMI, medically managed.   Assessment & Plan    1. NSTEMI - Troponin peaked at 23-Echo with mildly reduced EF as above with HK if inferior and inferolateral wall - Given her age and comorbidities, it has been decided to treat her medically. Recommend aspirin 81 mg once daily as she is already on anticoagulation for atrial fibrillation. Avoid other antiplatelet medications.  2. ICM: - EF was 50%. No beta blockers or Ace inhibitors given that her blood pressure is on the low side.  3. Chronic Afib s/p AVN ablation : -Currently, V paced in the 60's bpm - I'm going to keep her on unfractionated heparin for another day. INR is 1.68. Continue warfarin.  4. HTN: -BP soft -Antihypertensives held  Ambulate the patient today and most likely she can be discharged home tomorrow.  Signed, Kathlyn Sacramento, MD [facility] 12/30/2016, 8:08 AM

## 2016-12-30 NOTE — Care Management (Signed)
Patient does not feel she needs home health at discharge but she would consider if it is recommended. PT consult still pending.

## 2016-12-30 NOTE — Progress Notes (Signed)
[name] ANTICOAGULATION CONSULT NOTE -  Pharmacy Consult for Warfarin Indication: atrial fibrillation  Allergies  Allergen Reactions  . Amoxicillin   . Cefdinir Diarrhea  . Cilostazol     Other reaction(s): Headache  . Levofloxacin   . Plavix [Clopidogrel Bisulfate]     12/12/11 patient reports she has never taken Plavix before.   . Sulfur Nausea Only    Patient Measurements: Height: 5\' 2"  (157.5 cm) Weight: 125 lb 11.2 oz (57 kg) IBW/kg (Calculated) : 50.1 Heparin Dosing Weight:    Vital Signs: Temp: 98.3 F (36.8 C) (03/02 0610) Temp Source: Oral (03/02 0610) BP: 100/53 (03/02 0610) Pulse Rate: 50 (03/02 0610)  Labs:  Recent Labs  12/27/16 1217  12/27/16 1313 12/27/16 1615  12/28/16 0245 12/28/16 1135 12/29/16 0521 12/29/16 1009 12/29/16 1529 12/29/16 2350 12/30/16 0625  HGB  --   < > 14.7  --   --  14.8  --  14.6  --   --   --   --   HCT  --   --  43.2  --   --  41.7  --  41.3  --   --   --   --   PLT  --   --  151  --   --  137*  --  126*  --   --   --   --   LABPROT 25.1*  --   --   --   --   --   --  21.8*  --   --   --  20.0*  INR 2.23  --   --   --   --   --   --  1.87  --   --   --  1.68  HEPARINUNFRC  --   --   --   --   < > 0.34 0.30 0.28*  --  0.42 0.29* 0.65  CREATININE 0.80  --   --   --   --  0.68  --   --   --   --   --   --   TROPONINI <0.03  --   --  13.73*  --   --  23.53*  --  10.93*  --   --   --   < > = values in this interval not displayed.  Estimated Creatinine Clearance: 39.9 mL/min (by C-G formula based on SCr of 0.68 mg/dL).   Medical History: Past Medical History:  Diagnosis Date  . Atrial fibrillation ([place]) 07/20/2009   Qualifier: Diagnosis of  By: Caryl Comes, MD, Leonidas Romberg Mack Guise   . C. difficile colitis    feces transplant  . Complete heart block  s/p av ablation    with atrial fibrillation. Pt is s/p AV nodal ablation due to permanent and symptomatic atrial fibrillation.   . Coronary artery disease    Nonobstructive  .  Diverticulitis   . GERD (gastroesophageal reflux disease)   . Hereditary hemochromatosis ([place])    Phlebotomized in the past, not getting phlebotomy now  . Hyperlipidemia   . Hypertension   . Non Hodgkin's lymphoma ([place])    stable; followed by an oncologist  . Osteoarthritis   . 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 Pacemaker-dual-chamber-Medtronic 04/04/2011    Medications:  Scheduled:  . aspirin EC  325 mg Oral Daily  . atorvastatin  40 mg Oral q1800  . docusate sodium  100 mg Oral BID  . folic acid  1 mg Oral Daily  .  gabapentin  400 mg Oral QHS  . vancomycin  250 mg Oral Q6H  . warfarin  3 mg Oral ONCE-1800  . Warfarin - Pharmacist Dosing Inpatient   Does not apply q1800   Infusions:  . heparin 900 Units/hr (12/30/16 0110)    Assessment: 84 yo F with NSTEMI, Chronic Afib.Patient on Warfarin 2 mg daily at home. Last dose of Warfarin on 12/26/16. Per Cardiology She is a frail elderly lady. Patient also on Vancomycin ORAL. Currently on Heparin drip.  2/27 INR 2.23    No Warfarin 2/28 No INR No Warfarin 3/1 INR 1.87 Warfarin 2 mg 3/2 INR 1.68   Goal of Therapy:  INR 2-3 Monitor platelets by anticoagulation protocol: Yes     Plan:  3/1: Per Cardiology-will need heparin to Coumadin bridge until therapeutic INR  Will restart Patient Home dose of Warfarin 2 mg po Daily.  Patient also on Vancomycin ORAL. Will f/u INR with am labs.  3/2: INR subtherapeutic. Will order Warfarin 3 mg po x 1 dose. F/u INR in am.  Melainie Krinsky A 12/30/2016,7:37 AM

## 2016-12-30 NOTE — Progress Notes (Signed)
PT Cancellation Note  Patient Details Name: [name] MRN: [id number] DOB: 14-Jun-1928   Cancelled Treatment:    Reason Eval/Treat Not Completed: Medical issues which prohibited therapy (INR remains subtherapeutic per most recent Pharmacy note).  PT will continue to follow acutely for medical appropriateness.   Collie Siad PT, DPT 12/30/2016, 9:55 AM

## 2016-12-30 NOTE — Progress Notes (Signed)
[place] at [facility]                                                                                                                                                                                  Patient Demographics   [name], is a 83 y.o. female, DOB - [birth year], [id number]  Admit date - 12/27/2016   Admitting Physician Epifanio Lesches, MD  Outpatient Primary MD for the patient is Van [place], MD   LOS - 3  Subjective:  Patient currently denies any chest pain she states that she is feeling better. Her INR still subtherapeutic    Review of Systems:   CONSTITUTIONAL: No documented fever. No fatigue, weakness. No weight gain, no weight loss.  EYES: No blurry or double vision.  ENT: No tinnitus. No postnasal drip. No redness of the oropharynx.  RESPIRATORY: No cough, no wheeze, no hemoptysis. No dyspnea.  CARDIOVASCULAR: No chest pain. No orthopnea. No palpitations. No syncope.  GASTROINTESTINAL: No nausea, no vomiting or diarrhea. No abdominal pain. No melena or hematochezia.  GENITOURINARY: No dysuria or hematuria.  ENDOCRINE: No polyuria or nocturia. No heat or cold intolerance.  HEMATOLOGY: No anemia. No bruising. No bleeding.  INTEGUMENTARY: No rashes. No lesions.  MUSCULOSKELETAL: No arthritis. No swelling. No gout.  NEUROLOGIC: No numbness, tingling, or ataxia. No seizure-type activity.  PSYCHIATRIC: No anxiety. No insomnia. No ADD.    Vitals:   Vitals:   12/29/16 1153 12/29/16 1947 12/30/16 0610 12/30/16 1244  BP: (!) 104/44 115/60 (!) 100/53 (!) 105/55  Pulse: (!) 59 (!) 59 (!) 50 (!) 59  Resp: 15 16 18 16   Temp: 97.8 F (36.6 C) 98 F (36.7 C) 98.3 F (36.8 C)   TempSrc: Oral Oral Oral   SpO2: 95% 97% 95% 100%  Weight:   125 lb 11.2 oz (57 kg)   Height:        Wt Readings from Last 3 Encounters:  12/30/16 125 lb 11.2 oz (57 kg)  09/30/16 118 lb 8 oz (53.8 kg)  09/20/16 119 lb 8 oz (54.2 kg)      Intake/Output Summary (Last 24 hours) at 12/30/16 1305 Last data filed at 12/30/16 1100  Gross per 24 hour  Intake           632.27 ml  Output             1400 ml  Net          -767.73 ml    Physical Exam:   GENERAL: Pleasant-appearing in no apparent distress.  HEAD, EYES, EARS, NOSE AND THROAT: Atraumatic, normocephalic. Extraocular muscles are intact. Pupils equal and reactive to  light. Sclerae anicteric. No conjunctival injection. No oro-pharyngeal erythema.  NECK: Supple. There is no jugular venous distention. No bruits, no lymphadenopathy, no thyromegaly.  HEART: Regular rate and rhythm,. No murmurs, no rubs, no clicks.  LUNGS: Clear to auscultation bilaterally. No rales or rhonchi. No wheezes.  ABDOMEN: Soft, flat, nontender, nondistended. Has good bowel sounds. No hepatosplenomegaly appreciated.  EXTREMITIES: No evidence of any cyanosis, clubbing, or peripheral edema.  +2 pedal and radial pulses bilaterally.  NEUROLOGIC: The patient is alert, awake, and oriented x3 with no focal motor or sensory deficits appreciated bilaterally.  SKIN: Moist and warm with no rashes appreciated.  Psych: Not anxious, depressed LN: No inguinal LN enlargement    Antibiotics   Anti-infectives    Start     Dose/Rate Route Frequency Ordered Stop   12/27/16 1815  vancomycin (VANCOCIN) 50 mg/mL oral solution 250 mg    Comments:  Patient taking differently: Qid every 3 days     250 mg Oral Every 6 hours 12/27/16 1812        Medications   Scheduled Meds: . aspirin EC  81 mg Oral Daily  . atorvastatin  40 mg Oral q1800  . docusate sodium  100 mg Oral BID  . folic acid  1 mg Oral Daily  . gabapentin  400 mg Oral QHS  . vancomycin  250 mg Oral Q6H  . warfarin  3 mg Oral ONCE-1800  . Warfarin - Pharmacist Dosing Inpatient   Does not apply q1800   Continuous Infusions: . heparin 900 Units/hr (12/30/16 0936)   PRN Meds:.acetaminophen **OR** acetaminophen, ALPRAZolam, bisacodyl,  ondansetron **OR** ondansetron (ZOFRAN) IV, traZODone   Data Review:   Micro Results No results found for this or any previous visit (from the past 240 hour(s)).  Radiology Reports Ct Abdomen Pelvis W Contrast  Result Date: 12/27/2016 CLINICAL DATA:  Epigastric and upper abdominal pain for 1 week. Decreased appetite. EXAM: CT ABDOMEN AND PELVIS WITH CONTRAST TECHNIQUE: Multidetector CT imaging of the abdomen and pelvis was performed using the standard protocol following bolus administration of intravenous contrast. CONTRAST:  85mL ISOVUE-300 IOPAMIDOL (ISOVUE-300) INJECTION 61% COMPARISON:  CT scan 06/05/2014 FINDINGS: Lower chest: The lung bases demonstrate significant breathing motion artifact. There are bibasilar scarring changes but no worrisome lesions or pleural effusion. The heart is enlarged. Three-vessel coronary artery calcifications are noted along with aortic calcifications. The distal esophagus is grossly normal. Small hiatal hernia. Hepatobiliary: No focal hepatic lesions are identified. The liver contour is irregular suggesting changes of cirrhosis. No focal hepatic lesions or intrahepatic biliary dilatation. The gallbladder is surgically absent. No common bile duct dilatation. Pancreas: Enlarging cystic lesion in the Ing pancreatic head measuring 23 x 21 mm. This measured a maximum of 9 mm on the prior study. It is adjacent to the common bile duct but I do not think it is associated with a duct. It has benign imaging features. There is no nodularity or enhancement. I would recommend MRI abdomen/ MRCP without and with contrast for further evaluation. The pancreatic duct is normal. Spleen: Normal size.  No focal lesions. Adrenals/Urinary Tract: The adrenal glands and kidneys are unremarkable. There is a small angiomyolipoma associated with the midpole lower pole junction region of the left kidney. This measures 6 mm and is unchanged. No worrisome renal lesions or hydronephrosis. No  obstructing ureteral calculi or bladder calculi. No bladder mass. Stomach/Bowel: Distended elongated stomach but no lesions or acute inflammation. The duodenum, small bowel and colon are unremarkable. No inflammatory  changes, mass lesions or obstructive findings. Moderate sigmoid diverticulosis but no findings for acute diverticulitis. The terminal ileum is normal. The appendix is not identified for certain. Vascular/Lymphatic: Advanced atherosclerotic calcifications involving the aorta and branch vessels. No aneurysm or dissection. The major venous structures are patent. No mesenteric or retroperitoneal mass or lymphadenopathy. Reproductive: Surgically absent. Other: There is an enlarged, 27 x 17 mm left external iliac lymph node on the left side. This was not present on the prior CT scan. I do not see any other lymphadenopathy typical along with at. There is a single right-sided inguinal lymph node which measures 10 mm which is likely normal. Musculoskeletal: No significant bony findings. Stable degenerative changes involving the spine along with osteoporosis. Chondrocalcinosis noted at the pubic symphysis. Benign-appearing sclerotic lesion in the left inferior pubic ramus is likely a bone island. IMPRESSION: 1. No acute abdominal findings or mass lesions. 2. Enlarging cystic lesion in the pancreatic head. Recommend MRI abdomen/MRCP without with contrast for further evaluation. 3. Enlarged left external iliac lymph node. Given its size biopsy may be indicated. No other lymphadenopathy is demonstrated. 4. Mild cirrhotic changes involving the liver. 5. Advanced atherosclerotic calcifications involving the aorta and branch vessels and also the coronary arteries. Electronically Signed   By: Marijo Sanes M.D.   On: 12/27/2016 15:06   Dg Chest Port 1 View  Result Date: 12/27/2016 CLINICAL DATA:  One week of mid chest pain with increased symptoms over the past 2 days. Also diarrhea. History of non-Hodgkin's  lymphoma, hemochromatosis, coronary artery did disease and heart block, C. difficile colitis with feces transplant. Former smoker. EXAM: PORTABLE CHEST 1 VIEW COMPARISON:  Chest x-ray of January 27, 2013 FINDINGS: The lungs remain well-expanded. The interstitial markings are mildly prominent though stable. There is no alveolar infiltrate, pleural effusion, or pneumothorax. The heart is top-normal in size. The pulmonary vascularity is normal. The permanent pacemaker is in stable position. There is calcification in the wall of the aortic arch. The observed bony thorax exhibits no acute abnormality. IMPRESSION: COPD.  No pneumonia nor CHF nor other acute cardiopulmonary disease. Thoracic aortic atherosclerosis. Electronically Signed   By: David  Martinique M.D.   On: 12/27/2016 12:45     CBC  Recent Labs Lab 12/27/16 1313 12/28/16 0245 12/29/16 0521 12/30/16 0625  WBC 12.0* 9.6 8.2 7.7  HGB 14.7 14.8 14.6 14.3  HCT 43.2 41.7 41.3 42.1  PLT 151 137* 126* 129*  MCV 92.7 90.9 93.0 93.4  MCH 31.5 32.3 32.8 31.6  MCHC 34.0 35.6 35.3 33.9  RDW 13.6 13.5 13.4 13.6  LYMPHSABS 1.8  --   --   --   MONOABS 0.8  --   --   --   EOSABS 0.0  --   --   --   BASOSABS 0.1  --   --   --     Chemistries   Recent Labs Lab 12/27/16 1217 12/28/16 0245  NA 137 136  K 5.1 3.9  CL 104 106  CO2 27 23  GLUCOSE 172* 136*  BUN 16 10  CREATININE 0.80 0.68  CALCIUM 9.2 8.5*  AST 57*  --   ALT 37  --   ALKPHOS 94  --   BILITOT 1.3*  --    ------------------------------------------------------------------------------------------------------------------ estimated creatinine clearance is 39.9 mL/min (by C-G formula based on SCr of 0.68 mg/dL). ------------------------------------------------------------------------------------------------------------------ No results for input(s): HGBA1C in the last 72  hours. ------------------------------------------------------------------------------------------------------------------  Recent Labs  12/29/16 0521  CHOL 155  HDL 33*  LDLCALC 92  TRIG 152*  CHOLHDL 4.7   ------------------------------------------------------------------------------------------------------------------ No results for input(s): TSH, T4TOTAL, T3FREE, THYROIDAB in the last 72 hours.  Invalid input(s): FREET3 ------------------------------------------------------------------------------------------------------------------ No results for input(s): VITAMINB12, FOLATE, FERRITIN, TIBC, IRON, RETICCTPCT in the last 72 hours.  Coagulation profile  Recent Labs Lab 12/27/16 1217 12/29/16 0521 12/30/16 0625  INR 2.23 1.87 1.68    No results for input(s): DDIMER in the last 72 hours.  Cardiac Enzymes  Recent Labs Lab 12/27/16 1615 12/28/16 1135 12/29/16 1009  TROPONINI 13.73* 23.53* 10.93*   ------------------------------------------------------------------------------------------------------------------ Invalid input(s): POCBNP    Assessment & Plan   83 year old female patient with multiple medical problems of essential hypertension, atrial fibrillation, recurrent C. difficile colitis having chest pain and found to have elevated troponins with non-ST elevation MI  #1Non-ST MI Based on the echo likely inferior MI involving RCA branch  Continued therapy   Medical manamgent cotinue asa  continue lipid lowering regimen  Blood pressures too low to use a beta blocker   per cards needs to stay on heparin for 1 more day while her INR gets closer to 2 and can be discharged tomorrow  #2. recurrent C. difficile: Patient on suppressive therapy with vancomycin, follows up with Dr. 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 Management plans  discussed with the patient, family and they are in agreement.  CODE STATUS: DNR      Code Status Orders        Start     Ordered   12/27/16 1811  Do not attempt resuscitation (DNR)  Continuous    Question Answer Comment  In the event of cardiac or respiratory ARREST Do not call a "code blue"   In the event of cardiac or respiratory ARREST Do not perform Intubation, CPR, defibrillation or ACLS   In the event of cardiac or respiratory ARREST Use medication by any route, position, wound care, and other measures to relive pain and suffering. May use oxygen, suction and manual treatment of airway obstruction as needed for comfort.      12/27/16 1812    Code Status History    Date Active Date Inactive Code Status Order ID Comments User Context   08/28/2016  9:36 PM 09/01/2016  4:56 PM DNR [id number]  Loletha Grayer, MD ED    Advance Directive Documentation   [place] Most Recent Value  Type of Advance Directive  Out of facility DNR (pink MOST or yellow form)  Pre-existing out of facility DNR order (yellow form or pink MOST form)  Yellow form placed in chart (order not valid for inpatient use)  "MOST" Form in Place?  No data           Consults  cardiology   DVT Prophylaxis  heaprin  Lab Results  Component Value Date   PLT 129 (L) 12/30/2016     Time Spent in minutes   39min Greater than 50% of time spent in care coordination and counseling patient regarding the condition and plan of care.   Dustin Flock M.D on 12/30/2016 at 1:05 PM  Between 7am to 6pm - Pager - [phone]  After 6pm go to [email or web address] - password [email or web address] [place]   Office  [phone]

## 2016-12-30 NOTE — Care Management Important Message (Signed)
Important Message  Patient Details  Name: [name] MRN: [id number] Date of Birth: 06/16/1928   Medicare Important Message Given:  Yes Initial signed IM printed from Epic and given to patient.   Katrina Stack, RN 12/30/2016, 10:52 AM

## 2016-12-30 NOTE — Progress Notes (Signed)
ANTICOAGULATION CONSULT NOTE -  Pharmacy Consult for Heparin  Indication: chest pain/ACS  Allergies  Allergen Reactions  . Amoxicillin   . Cefdinir Diarrhea  . Cilostazol     Other reaction(s): Headache  . Levofloxacin   . Plavix [Clopidogrel Bisulfate]     12/12/11 patient reports she has never taken Plavix before.   . Sulfur Nausea Only    Patient Measurements: Height: 5\' 2"  (157.5 cm) Weight: 125 lb 11.2 oz (57 kg) IBW/kg (Calculated) : 50.1 Heparin Dosing Weight: 54  Vital Signs: Temp: 98.3 F (36.8 C) (03/02 0610) Temp Source: Oral (03/02 0610) BP: 100/53 (03/02 0610) Pulse Rate: 50 (03/02 0610)  Labs:  Recent Labs  12/27/16 1217  12/27/16 1615  12/28/16 0245 12/28/16 1135 12/29/16 0521 12/29/16 1009 12/29/16 1529 12/29/16 2350 12/30/16 0625  HGB  --   < >  --   --  14.8  --  14.6  --   --   --  14.3  HCT  --   < >  --   --  41.7  --  41.3  --   --   --  42.1  PLT  --   < >  --   --  137*  --  126*  --   --   --  129*  LABPROT 25.1*  --   --   --   --   --  21.8*  --   --   --  20.0*  INR 2.23  --   --   --   --   --  1.87  --   --   --  1.68  HEPARINUNFRC  --   --   --   < > 0.34 0.30 0.28*  --  0.42 0.29* 0.65  CREATININE 0.80  --   --   --  0.68  --   --   --   --   --   --   TROPONINI <0.03  --  13.73*  --   --  23.53*  --  10.93*  --   --   --   < > = values in this interval not displayed.  Estimated Creatinine Clearance: 39.9 mL/min (by C-G formula based on SCr of 0.68 mg/dL).   Medical History: Past Medical History:  Diagnosis Date  . Atrial fibrillation ([place]) 07/20/2009   Qualifier: Diagnosis of  By: Caryl Comes, MD, Leonidas Romberg Mack Guise   . C. difficile colitis    feces transplant  . Complete heart block  s/p av ablation    with atrial fibrillation. Pt is s/p AV nodal ablation due to permanent and symptomatic atrial fibrillation.   . Coronary artery disease    Nonobstructive  . Diverticulitis   . GERD (gastroesophageal reflux disease)   .  Hereditary hemochromatosis ([place])    Phlebotomized in the past, not getting phlebotomy now  . Hyperlipidemia   . Hypertension   . Non Hodgkin's lymphoma ([place])    stable; followed by an oncologist  . Osteoarthritis   . 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 Pacemaker-dual-chamber-[employer]tr[employer] 04/04/2011    Medications:  Prescriptions Prior to Admission  Medication Sig Dispense Refill Last Dose  . ALPRAZolam (XANAX) 0.25 MG tablet Take 0.5 mg by mouth at bedtime as needed for sleep.   12/26/2016 at pm  . amLODipine (NORVASC) 5 MG tablet TAKE ONE TABLET BY MOUTH ONCE DAILY   12/26/2016 at pm  . Cetirizine HCl (ZYRTEC ALLERGY PO) Take by mouth daily.  12/26/2016 at pm  . fluticasone (FLONASE) 50 MCG/ACT nasal spray Place 2 sprays into the nose daily as needed for allergies.    [id number] at pm  . folic acid (FOLVITE) 1 MG tablet Take 1 mg by mouth daily.     12/26/2016 at pm  . gabapentin (NEURONTIN) 400 MG capsule Take 400 mg by mouth at bedtime.    12/26/2016 at pm  . meclizine (ANTIVERT) 25 MG tablet Take 1 tablet (25 mg total) by mouth 3 (three) times daily as needed. 30 tablet 0 prn at prn  . polyvinyl alcohol (LIQUIFILM TEARS) 1.4 % ophthalmic solution Place 2 drops into both eyes 3 (three) times daily as needed (for dry eyes).   prn at prn  . vancomycin (VANCOCIN) 50 mg/mL oral solution Take 5 mLs (250 mg total) by mouth every 6 (six) hours. (Patient taking differently: Take 250 mg by mouth every 6 (six) hours. Qid every 3 days) 240 mL 0 12/27/2016 at am  . warfarin (COUMADIN) 2 MG tablet TAKE ONE TABLET BY MOUTH ONCE DAILY AS DIRECTED (Patient taking differently: TAKE ONE TABLET BY MOUTH ONCE DAILY) 30 tablet 3 12/26/2016 at 1800  . gabapentin (NEURONTIN) 100 MG capsule Take 100 mg by mouth. Takes 1 tablet in the am daily.   Not Taking  . warfarin (COUMADIN) 3 MG tablet Take as directed by coumadin clinic (Patient not taking: Reported on 12/27/2016) 20 tablet 3 Not Taking at Unknown time   Scheduled:  .  aspirin EC  81 mg Oral Daily  . atorvastatin  40 mg Oral q1800  . docusate sodium  100 mg Oral BID  . folic acid  1 mg Oral Daily  . gabapentin  400 mg Oral QHS  . vancomycin  250 mg Oral Q6H  . warfarin  3 mg Oral ONCE-1800  . Warfarin - Pharmacist Dosing Inpatient   Does not apply q1800    Assessment: Pharmacy consulted to dose and monitor heparin in this 83 year old woman being treated for ACS. Patient is also on warfarin as an outpatient.   Goal of Therapy:  Heparin level 0.3-0.7 units/ml Monitor platelets by anticoagulation protocol: Yes   Plan:  INR therapeutic on admission but last dose of warfarin was 2/26 and troponin continues to increase. Will increase heparin drip slightly as HL is at the low end of goal range. Will f/u cardiology plans for cath and duration of heparin therapy as well as plans to resume warfarin. Will recheck a HL and PT/INR in AM.   3/1 0521 HL subtherapeutic x 1. 800 units IV x 1 bolus and increase rate to 800 units/hr. Will recheck HL in 8 hours.   3/1:  HL @ 1529= 0.42.  Will continue current rate and check confirmatory level in 8 hrs. Note: patient restarted on Warfarin.  3/1 2350 HL subtherapeutic x 1. 800 units IV x 1 bolus and increase rate to 900 units/hr. Will recheck HL in 8 hours.  3/2: HL @ 0625= 0.65. Note this was ordered for 0900 so it is 2.5 hrs early. Will continue current rate of 900 unts/hr and check confirmatory level in 8 hrs.    Noralee Space, PharmD, BCPS Clinical Pharmacist 12/30/2016,10:18 AM

## 2016-12-30 NOTE — Plan of Care (Signed)
Problem: Safety: Goal: Ability to remain free from injury will improve Outcome: Progressing Exit alarm is activated, patient is encouraged to call out for assistance with activity.

## 2016-12-30 NOTE — Progress Notes (Signed)
ANTICOAGULATION CONSULT NOTE -  Pharmacy Consult for Heparin  Indication: chest pain/ACS  Allergies  Allergen Reactions  . Amoxicillin   . Cefdinir Diarrhea  . Cilostazol     Other reaction(s): Headache  . Levofloxacin   . Plavix [Clopidogrel Bisulfate]     12/12/11 patient reports she has never taken Plavix before.   . Sulfur Nausea Only    Patient Measurements: Height: 5\' 2"  (157.5 cm) Weight: 125 lb 10.6 oz (57 kg) IBW/kg (Calculated) : 50.1 Heparin Dosing Weight: 54  Vital Signs: Temp: 98 F (36.7 C) (03/01 1947) Temp Source: Oral (03/01 1947) BP: 115/60 (03/01 1947) Pulse Rate: 59 (03/01 1947)  Labs:  Recent Labs  12/27/16 1217  12/27/16 1313 12/27/16 1615  12/28/16 0245 12/28/16 1135 12/29/16 0521 12/29/16 1009 12/29/16 1529 12/29/16 2350  HGB  --   < > 14.7  --   --  14.8  --  14.6  --   --   --   HCT  --   --  43.2  --   --  41.7  --  41.3  --   --   --   PLT  --   --  151  --   --  137*  --  126*  --   --   --   LABPROT 25.1*  --   --   --   --   --   --  21.8*  --   --   --   INR 2.23  --   --   --   --   --   --  1.87  --   --   --   HEPARINUNFRC  --   --   --   --   < > 0.34 0.30 0.28*  --  0.42 0.29*  CREATININE 0.80  --   --   --   --  0.68  --   --   --   --   --   TROPONINI <0.03  --   --  13.73*  --   --  23.53*  --  10.93*  --   --   < > = values in this interval not displayed.  Estimated Creatinine Clearance: 39.9 mL/min (by C-G formula based on SCr of 0.68 mg/dL).   Medical History: Past Medical History:  Diagnosis Date  . Atrial fibrillation ([place]) 07/20/2009   Qualifier: Diagnosis of  By: Caryl Comes, MD, Leonidas Romberg Mack Guise   . C. difficile colitis    feces transplant  . Complete heart block  s/p av ablation    with atrial fibrillation. Pt is s/p AV nodal ablation due to permanent and symptomatic atrial fibrillation.   . Coronary artery disease    Nonobstructive  . Diverticulitis   . GERD (gastroesophageal reflux disease)   .  Hereditary hemochromatosis ([place])    Phlebotomized in the past, not getting phlebotomy now  . Hyperlipidemia   . Hypertension   . Non Hodgkin's lymphoma ([place])    stable; followed by an oncologist  . Osteoarthritis   . 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 Pacemaker-dual-chamber-Medtronic 04/04/2011    Medications:  Prescriptions Prior to Admission  Medication Sig Dispense Refill Last Dose  . ALPRAZolam (XANAX) 0.25 MG tablet Take 0.5 mg by mouth at bedtime as needed for sleep.   12/26/2016 at pm  . amLODipine (NORVASC) 5 MG tablet TAKE ONE TABLET BY MOUTH ONCE DAILY   12/26/2016 at pm  . Cetirizine HCl (ZYRTEC ALLERGY  PO) Take by mouth daily.    12/26/2016 at pm  . fluticasone (FLONASE) 50 MCG/ACT nasal spray Place 2 sprays into the nose daily as needed for allergies.    [id number] at pm  . folic acid (FOLVITE) 1 MG tablet Take 1 mg by mouth daily.     12/26/2016 at pm  . gabapentin (NEURONTIN) 400 MG capsule Take 400 mg by mouth at bedtime.    12/26/2016 at pm  . meclizine (ANTIVERT) 25 MG tablet Take 1 tablet (25 mg total) by mouth 3 (three) times daily as needed. 30 tablet 0 prn at prn  . polyvinyl alcohol (LIQUIFILM TEARS) 1.4 % ophthalmic solution Place 2 drops into both eyes 3 (three) times daily as needed (for dry eyes).   prn at prn  . vancomycin (VANCOCIN) 50 mg/mL oral solution Take 5 mLs (250 mg total) by mouth every 6 (six) hours. (Patient taking differently: Take 250 mg by mouth every 6 (six) hours. Qid every 3 days) 240 mL 0 12/27/2016 at am  . warfarin (COUMADIN) 2 MG tablet TAKE ONE TABLET BY MOUTH ONCE DAILY AS DIRECTED (Patient taking differently: TAKE ONE TABLET BY MOUTH ONCE DAILY) 30 tablet 3 12/26/2016 at 1800  . gabapentin (NEURONTIN) 100 MG capsule Take 100 mg by mouth. Takes 1 tablet in the am daily.   Not Taking  . warfarin (COUMADIN) 3 MG tablet Take as directed by coumadin clinic (Patient not taking: Reported on 12/27/2016) 20 tablet 3 Not Taking at Unknown time   Scheduled:  .  aspirin EC  325 mg Oral Daily  . atorvastatin  40 mg Oral q1800  . docusate sodium  100 mg Oral BID  . folic acid  1 mg Oral Daily  . gabapentin  400 mg Oral QHS  . heparin  800 Units Intravenous Once  . vancomycin  250 mg Oral Q6H  . warfarin  2 mg Oral q1800  . Warfarin - Pharmacist Dosing Inpatient   Does not apply q1800    Assessment: Pharmacy consulted to dose and monitor heparin in this 83 year old woman being treated for ACS. Patient is also on warfarin as an outpatient.   Goal of Therapy:  Heparin level 0.3-0.7 units/ml Monitor platelets by anticoagulation protocol: Yes   Plan:  INR therapeutic on admission but last dose of warfarin was 2/26 and troponin continues to increase. Will increase heparin drip slightly as HL is at the low end of goal range. Will f/u cardiology plans for cath and duration of heparin therapy as well as plans to resume warfarin. Will recheck a HL and PT/INR in AM.   3/1 0521 HL subtherapeutic x 1. 800 units IV x 1 bolus and increase rate to 800 units/hr. Will recheck HL in 8 hours.   3/1:  HL @ 1529= 0.42.  Will continue current rate and check confirmatory level in 8 hrs. Note: patient restarted on Warfarin.  3/1 2350 HL subtherapeutic x 1. 800 units IV x 1 bolus and increase rate to 900 units/hr. Will recheck HL in 8 hours.   Laural Benes, PharmD, BCPS Clinical Pharmacist 12/30/2016,1:01 AM

## 2016-12-31 ENCOUNTER — Telehealth: Payer: Self-pay | Admitting: Internal Medicine

## 2016-12-31 DIAGNOSIS — Z7189 Other specified counseling: Secondary | ICD-10-CM

## 2016-12-31 LAB — CBC
HEMATOCRIT: 41.8 % (ref 35.0–47.0)
HEMOGLOBIN: 14.6 g/dL (ref 12.0–16.0)
MCH: 32.1 pg (ref 26.0–34.0)
MCHC: 35 g/dL (ref 32.0–36.0)
MCV: 91.9 fL (ref 80.0–100.0)
Platelets: 135 10*3/uL — ABNORMAL LOW (ref 150–440)
RBC: 4.55 MIL/uL (ref 3.80–5.20)
RDW: 13.6 % (ref 11.5–14.5)
WBC: 6.9 10*3/uL (ref 3.6–11.0)

## 2016-12-31 LAB — GLUCOSE, CAPILLARY: Glucose-Capillary: 91 mg/dL (ref 65–99)

## 2016-12-31 LAB — HEPARIN LEVEL (UNFRACTIONATED): Heparin Unfractionated: 0.54 IU/mL (ref 0.30–0.70)

## 2016-12-31 LAB — PROTIME-INR
INR: 1.67
Prothrombin Time: 19.9 seconds — ABNORMAL HIGH (ref 11.4–15.2)

## 2016-12-31 MED ORDER — ENOXAPARIN SODIUM 120 MG/0.8ML ~~LOC~~ SOLN: 1.0000 mg/kg | Freq: Two times a day (BID) | SUBCUTANEOUS | 0 refills | Status: DC

## 2016-12-31 MED ORDER — ASPIRIN 81 MG PO TBEC: 81.0000 mg | DELAYED_RELEASE_TABLET | Freq: Every day | ORAL | Status: DC

## 2016-12-31 MED ORDER — CARVEDILOL 6.25 MG PO TABS: 6.2500 mg | ORAL_TABLET | Freq: Two times a day (BID) | ORAL | 1 refills | Status: DC

## 2016-12-31 MED ORDER — ATORVASTATIN CALCIUM 40 MG PO TABS: 20.0000 mg | ORAL_TABLET | Freq: Every day | ORAL | 0 refills | Status: DC

## 2016-12-31 MED ORDER — CARVEDILOL 6.25 MG PO TABS: 6.2500 mg | ORAL_TABLET | Freq: Two times a day (BID) | ORAL | 0 refills | Status: DC

## 2016-12-31 MED ORDER — WARFARIN SODIUM 2 MG PO TABS: 4.0000 mg | ORAL_TABLET | Freq: Every day | ORAL | Status: DC

## 2016-12-31 MED ORDER — WARFARIN SODIUM 3 MG PO TABS: 4.0000 mg | ORAL_TABLET | Freq: Once | ORAL | Status: DC

## 2016-12-31 MED ORDER — ATORVASTATIN CALCIUM 40 MG PO TABS: 20.0000 mg | ORAL_TABLET | Freq: Every day | ORAL | 1 refills | Status: DC

## 2016-12-31 NOTE — Progress Notes (Signed)
ANTICOAGULATION CONSULT NOTE -  Pharmacy Consult for Heparin  Indication: chest pain/ACS  Allergies  Allergen Reactions  . Amoxicillin   . Cefdinir Diarrhea  . Cilostazol     Other reaction(s): Headache  . Levofloxacin   . Plavix [Clopidogrel Bisulfate]     12/12/11 patient reports she has never taken Plavix before.   . Sulfur Nausea Only    Patient Measurements: Height: 5\' 2"  (157.5 cm) Weight: 123 lb 3.2 oz (55.9 kg) IBW/kg (Calculated) : 50.1 Heparin Dosing Weight: 54  Vital Signs: Temp: 98.3 F (36.8 C) (03/03 0446) Temp Source: Oral (03/03 0446) BP: 111/53 (03/03 0446) Pulse Rate: 50 (03/03 0446)  Labs:  Recent Labs  12/28/16 1135  12/29/16 0521 12/29/16 1009  12/30/16 0625 12/30/16 1340 12/31/16 0532  HGB  --   < > 14.6  --   --  14.3  --  14.6  HCT  --   --  41.3  --   --  42.1  --  41.8  PLT  --   --  126*  --   --  129*  --  135*  LABPROT  --   --  21.8*  --   --  20.0*  --  19.9*  INR  --   --  1.87  --   --  1.68  --  1.67  HEPARINUNFRC 0.30  --  0.28*  --   < > 0.65 0.62 0.54  TROPONINI 23.53*  --   --  10.93*  --   --   --   --   < > = values in this interval not displayed.  Estimated Creatinine Clearance: 39.9 mL/min (by C-G formula based on SCr of 0.68 mg/dL).   Medical History: Past Medical History:  Diagnosis Date  . Atrial fibrillation ([place]) 07/20/2009   Qualifier: Diagnosis of  By: Caryl Comes, MD, Leonidas Romberg Mack Guise   . C. difficile colitis    feces transplant  . Complete heart block  s/p av ablation    with atrial fibrillation. Pt is s/p AV nodal ablation due to permanent and symptomatic atrial fibrillation.   . Coronary artery disease    Nonobstructive  . Diverticulitis   . GERD (gastroesophageal reflux disease)   . Hereditary hemochromatosis ([place])    Phlebotomized in the past, not getting phlebotomy now  . Hyperlipidemia   . Hypertension   . Non Hodgkin's lymphoma ([place])    stable; followed by an oncologist  . Osteoarthritis   .  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 Pacemaker-dual-chamber-Medtronic 04/04/2011    Medications:  Prescriptions Prior to Admission  Medication Sig Dispense Refill Last Dose  . ALPRAZolam (XANAX) 0.25 MG tablet Take 0.5 mg by mouth at bedtime as needed for sleep.   12/26/2016 at pm  . amLODipine (NORVASC) 5 MG tablet TAKE ONE TABLET BY MOUTH ONCE DAILY   12/26/2016 at pm  . Cetirizine HCl (ZYRTEC ALLERGY PO) Take by mouth daily.    12/26/2016 at pm  . fluticasone (FLONASE) 50 MCG/ACT nasal spray Place 2 sprays into the nose daily as needed for allergies.    [id number] at pm  . folic acid (FOLVITE) 1 MG tablet Take 1 mg by mouth daily.     12/26/2016 at pm  . gabapentin (NEURONTIN) 400 MG capsule Take 400 mg by mouth at bedtime.    12/26/2016 at pm  . meclizine (ANTIVERT) 25 MG tablet Take 1 tablet (25 mg total) by mouth 3 (three) times daily as needed. 30 tablet  0 prn at prn  . polyvinyl alcohol (LIQUIFILM TEARS) 1.4 % ophthalmic solution Place 2 drops into both eyes 3 (three) times daily as needed (for dry eyes).   prn at prn  . vancomycin (VANCOCIN) 50 mg/mL oral solution Take 5 mLs (250 mg total) by mouth every 6 (six) hours. (Patient taking differently: Take 250 mg by mouth every 6 (six) hours. Qid every 3 days) 240 mL 0 12/27/2016 at am  . warfarin (COUMADIN) 2 MG tablet TAKE ONE TABLET BY MOUTH ONCE DAILY AS DIRECTED (Patient taking differently: TAKE ONE TABLET BY MOUTH ONCE DAILY) 30 tablet 3 12/26/2016 at 1800  . gabapentin (NEURONTIN) 100 MG capsule Take 100 mg by mouth. Takes 1 tablet in the am daily.   Not Taking  . warfarin (COUMADIN) 3 MG tablet Take as directed by coumadin clinic (Patient not taking: Reported on 12/27/2016) 20 tablet 3 Not Taking at Unknown time   Scheduled:  . aspirin EC  81 mg Oral Daily  . atorvastatin  40 mg Oral q1800  . docusate sodium  100 mg Oral BID  . folic acid  1 mg Oral Daily  . gabapentin  400 mg Oral QHS  . vancomycin  250 mg Oral Q6H  . Warfarin - Pharmacist  Dosing Inpatient   Does not apply q1800    Assessment: Pharmacy consulted to dose and monitor heparin in this 83 year old woman being treated for ACS. Patient is also on warfarin as an outpatient.   Goal of Therapy:  Heparin level 0.3-0.7 units/ml Monitor platelets by anticoagulation protocol: Yes   Plan:  INR therapeutic on admission but last dose of warfarin was 2/26 and troponin continues to increase. Will increase heparin drip slightly as HL is at the low end of goal range. Will f/u cardiology plans for cath and duration of heparin therapy as well as plans to resume warfarin. Will recheck a HL and PT/INR in AM.   3/1 0521 HL subtherapeutic x 1. 800 units IV x 1 bolus and increase rate to 800 units/hr. Will recheck HL in 8 hours.   3/1:  HL @ 1529= 0.42.  Will continue current rate and check confirmatory level in 8 hrs. Note: patient restarted on Warfarin.  3/1 2350 HL subtherapeutic x 1. 800 units IV x 1 bolus and increase rate to 900 units/hr. Will recheck HL in 8 hours.  3/2: HL @ 0625= 0.65. Note this was ordered for 0900 so it is 2.5 hrs early. Will continue current rate of 900 unts/hr and check confirmatory level in 8 hrs.   3/2: HL @ 1340= 0.62.  Will continue current rate and recheck in am. Patient on Warfarin- bridging till INR therapeutic  3/3 0532 HL therapeutic. Continue current rate. Pharmacy will continue to monitor HL and CBC daily while on heparin.  Laural Benes, PharmD, BCPS Clinical Pharmacist 12/31/2016,7:17 AM

## 2016-12-31 NOTE — Care Management Note (Signed)
Case Management Note  Patient Details  Name: [name] MRN: [id number] Date of Birth: [birth year]  Subjective/Objective:    No home health orders, no HH-PT recommended by [facility]-PT.                 Action/Plan:   Expected Discharge Date:  12/31/16               Expected Discharge Plan:  Home/Self Care  In-House Referral:     Discharge planning Services  CM Consult  Post Acute Care Choice:    Choice offered to:     DME Arranged:    DME Agency:     HH Arranged:    HH Agency:     Status of Service:  In process, will continue to follow  If discussed at Long Length of Stay Meetings, dates discussed:    Additional Comments:  Lauri Till A, RN 12/31/2016, 8:46 AM

## 2016-12-31 NOTE — Discharge Instructions (Signed)
[place] [facility] [place]:  Cardiac diet  DISCHARGE CONDITION:  Stable  ACTIVITY:  Activity as tolerated  OXYGEN:  Home Oxygen: No.   Oxygen Delivery: none  DISCHARGE LOCATION:  home    ADDITIONAL DISCHARGE INSTRUCTION: Take coumadin 4 mg for next 3 days then go back to taking same dose of coumadin, Please have your INR check Monday as Tuesday as you were doing before  If you experience worsening of your admission symptoms, develop shortness of breath, life threatening emergency, suicidal or homicidal thoughts you must seek medical attention immediately by calling 911 or calling your MD immediately  if symptoms less severe.  You Must read complete instructions/literature along with all the possible adverse reactions/side effects for all the Medicines you take and that have been prescribed to you. Take any new Medicines after you have completely understood and accpet all the possible adverse reactions/side effects.   Please note  You were cared for by a hospitalist during your hospital stay. If you have any questions about your discharge medications or the care you received while you were in the hospital after you are discharged, you can call the unit and asked to speak with the hospitalist on call if the hospitalist that took care of you is not available. Once you are discharged, your primary care physician will handle any further medical issues. Please note that NO REFILLS for any discharge medications will be authorized once you are discharged, as it is imperative that you return to your primary care physician (or establish a relationship with a primary care physician if you do not have one) for your aftercare needs so that they can reassess your need for medications and monitor your lab values.

## 2016-12-31 NOTE — Discharge Summary (Signed)
[name] at [facility], 83 y.o., DOB Dec 17, 1927, MRN [id number]. Admission date: 12/27/2016 Discharge Date 12/31/2016 Primary MD BERT Briscoe Burns III, MD Admitting Physician Epifanio Lesches, MD  Admission Diagnosis  Pancreatic mass [K86.9] NSTEMI (non-ST elevated myocardial infarction) [facility]) [I21.4] Chest pain [R07.9] Chest pain, unspecified type [R07.9]  Discharge Diagnosis   Active Problems:   NSTEMI (non-ST elevated myocardial infarction) [facility])   Pancreatic mass with normal CA-19-9 level   Coronary artery disease involving native coronary artery of native heart with unstable angina pectoris (HCC)   Chronic atrial fibrillation   Status post pacemaker placement   Osteoarthritis   Non-Hodgkin's lymphoma   Her dietary hemachromatosis   GERD   Current C. difficile       Hospital Course   [name]  is a 83 y.o. 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 Patient was admitted to Hospital subsequent evaluation showed a troponin that was elevated as high as 23. Cardiology discussed with the patient regarding further evaluation and therapy. Patient did not want a cardiac catheterization. Her echo did show inferior wall motion abnormality consistent with an inferior MI. Patient is currently being medically managed. She is not having any chest pain. If her symptoms persist and she is readmitted to the hospital consider cardiac catheterization. She does have a history of atrial fibrillation and her Coumadin was held due to her being on heparin drip. She will need to take higher dose of Coumadin next few days. Her INR will be checked on Monday or Tuesday.          Consults  cardiology  Sign   eval Ct Abdomen Pelvis W Contrast  Result Date: 12/27/2016 CLINICAL DATA:  Epigastric and upper abdominal pain for 1 week. Decreased appetite. EXAM: CT ABDOMEN AND PELVIS WITH CONTRAST TECHNIQUE: Multidetector CT imaging of the abdomen and pelvis was performed using the standard protocol following bolus administration of intravenous contrast. CONTRAST:  4mL ISOVUE-300 IOPAMIDOL (ISOVUE-300) INJECTION 61% COMPARISON:  CT scan 06/05/2014 FINDINGS: Lower chest: The lung bases demonstrate significant breathing motion artifact. There are bibasilar scarring changes but no worrisome lesions or pleural effusion. The heart is enlarged. Three-vessel coronary artery calcifications are noted along with aortic calcifications. The distal esophagus is grossly normal. Small hiatal hernia. Hepatobiliary: No focal hepatic lesions are identified. The liver contour is irregular suggesting changes of cirrhosis. No focal hepatic lesions or intrahepatic biliary dilatation. The gallbladder is surgically absent. No common bile duct dilatation. Pancreas: Enlarging cystic lesion in the Ing pancreatic head measuring 23 x 21 mm. This measured a maximum of 9 mm on the prior study. It is adjacent to the common bile duct but I do not think it is associated with a duct. It has benign imaging features. There is no nodularity or enhancement. I would recommend MRI abdomen/ MRCP without and with contrast for further evaluation. The pancreatic duct is normal. Spleen: Normal size.  No focal lesions. Adrenals/Urinary Tract: The adrenal glands and kidneys are unremarkable. There is a small angiomyolipoma associated with the midpole lower pole junction region of the left kidney. This measures 6 mm and is unchanged. No worrisome renal lesions or hydronephrosis. No obstructing ureteral calculi or bladder calculi. No bladder mass. Stomach/Bowel: Distended elongated stomach but no lesions or  acute inflammation. The duodenum, small bowel and colon are  unremarkable. No inflammatory changes, mass lesions or obstructive findings. Moderate sigmoid diverticulosis but no findings for acute diverticulitis. The terminal ileum is normal. The appendix is not identified for certain. Vascular/Lymphatic: Advanced atherosclerotic calcifications involving the aorta and branch vessels. No aneurysm or dissection. The major venous structures are patent. No mesenteric or retroperitoneal mass or lymphadenopathy. Reproductive: Surgically absent. Other: There is an enlarged, 27 x 17 mm left external iliac lymph node on the left side. This was not present on the prior CT scan. I do not see any other lymphadenopathy typical along with at. There is a single right-sided inguinal lymph node which measures 10 mm which is likely normal. Musculoskeletal: No significant bony findings. Stable degenerative changes involving the spine along with osteoporosis. Chondrocalcinosis noted at the pubic symphysis. Benign-appearing sclerotic lesion in the left inferior pubic ramus is likely a bone island. IMPRESSION: 1. No acute abdominal findings or mass lesions. 2. Enlarging cystic lesion in the pancreatic head. Recommend MRI abdomen/MRCP without with contrast for further evaluation. 3. Enlarged left external iliac lymph node. Given its size biopsy may be indicated. No other lymphadenopathy is demonstrated. 4. Mild cirrhotic changes involving the liver. 5. Advanced atherosclerotic calcifications involving the aorta and branch vessels and also the coronary arteries. Electronically Signed   By: Marijo Sanes M.D.   On: 12/27/2016 15:06   Dg Chest Port 1 View  Result Date: 12/27/2016 CLINICAL DATA:  One week of mid chest pain with increased symptoms over the past 2 days. Also diarrhea. History of non-Hodgkin's lymphoma, hemochromatosis, coronary artery did disease and heart block, C. difficile colitis with feces transplant. Former smoker. EXAM: PORTABLE CHEST 1 VIEW COMPARISON:  Chest x-ray of January 27, 2013 FINDINGS: The lungs remain well-expanded. The interstitial markings are mildly prominent though stable. There is no alveolar infiltrate, pleural effusion, or pneumothorax. The heart is top-normal in size. The pulmonary vascularity is normal. The permanent pacemaker is in stable position. There is calcification in the wall of the aortic arch. The observed bony thorax exhibits no acute abnormality. IMPRESSION: COPD.  No pneumonia nor CHF nor other acute cardiopulmonary disease. Thoracic aortic atherosclerosis. Electronically Signed   By: David  Martinique M.D.   On: 12/27/2016 12:45       Today   Subjective:   [name]  feels well denies any chest pain or shortness of breath  Objective:   Blood pressure (!) 123/56, pulse 60, temperature 97.9 F (36.6 C), temperature source Oral, resp. rate 16, height 5\' 2"  (1.575 m), weight 123 lb 3.2 oz (55.9 kg), SpO2 96 %.  .  Intake/Output Summary (Last 24 hours) at 12/31/16 1133 Last data filed at 12/31/16 1015  Gross per 24 hour  Intake           1090.5 ml  Output             1700 ml  Net           -609.5 ml    Exam VITAL SIGNS: Blood pressure (!) 123/56, pulse 60, temperature 97.9 F (36.6 C), temperature source Oral, resp. rate 16, height 5\' 2"  (1.575 m), weight 123 lb 3.2 oz (55.9 kg), SpO2 96 %.  GENERAL:  83 y.o.-year-old patient lying in the bed with no acute distress.  EYES: Pupils equal, round, reactive to light and accommodation. No scleral icterus. Extraocular muscles intact.  HEENT: Head atraumatic, normocephalic. Oropharynx and nasopharynx clear.  NECK:  Supple, no jugular  venous distention. No thyroid enlargement, no tenderness.  LUNGS: Normal breath sounds bilaterally, no wheezing, rales,rhonchi or crepitation. No use of accessory muscles of respiration.  CARDIOVASCULAR: S1, S2 normal. No murmurs, rubs, or gallops.  ABDOMEN: Soft, nontender, nondistended. Bowel sounds present. No organomegaly or mass.  EXTREMITIES: No pedal  edema, cyanosis, or clubbing.  NEUROLOGIC: Cranial nerves II through XII are intact. Muscle strength 5/5 in all extremities. Sensation intact. Gait not checked.  PSYCHIATRIC: The patient is alert and oriented x 3.  SKIN: No obvious rash, lesion, or ulcer.   Data Review     CBC w Diff:  Lab Results  Component Value Date   WBC 6.9 12/31/2016   HGB 14.6 12/31/2016   HGB 15.7 06/08/2014   HCT 41.8 12/31/2016   HCT 43.6 06/08/2014   PLT 135 (L) 12/31/2016   PLT 194 06/08/2014   LYMPHOPCT 15 12/27/2016   LYMPHOPCT 46.6 06/08/2014   MONOPCT 7 12/27/2016   MONOPCT 9.4 06/08/2014   EOSPCT 0 12/27/2016   EOSPCT 2.2 06/08/2014   BASOPCT 1 12/27/2016   BASOPCT 0.6 06/08/2014   CMP:  Lab Results  Component Value Date   NA 136 12/28/2016   NA 137 06/08/2014   K 3.9 12/28/2016   K 5.3 (H) 06/08/2014   CL 106 12/28/2016   CL 104 06/08/2014   CO2 23 12/28/2016   CO2 25 06/08/2014   BUN 10 12/28/2016   BUN 12 06/08/2014   CREATININE 0.68 12/28/2016   CREATININE 0.79 06/08/2014   CREATININE 0.78 04/20/2011   PROT 6.7 12/27/2016   PROT 6.3 (L) 06/08/2014   ALBUMIN 3.9 12/27/2016   ALBUMIN 3.2 (L) 06/08/2014   BILITOT 1.3 (H) 12/27/2016   BILITOT 0.7 06/08/2014   ALKPHOS 94 12/27/2016   ALKPHOS 89 06/08/2014   AST 57 (H) 12/27/2016   AST 35 06/08/2014   ALT 37 12/27/2016   ALT 40 06/08/2014  .  Micro Results No results found for this or any previous visit (from the past 240 hour(s)).      Code Status Orders        Start     Ordered   12/27/16 1811  Do not attempt resuscitation (DNR)  Continuous    Question Answer Comment  In the event of cardiac or respiratory ARREST Do not call a "code blue"   In the event of cardiac or respiratory ARREST Do not perform Intubation, CPR, defibrillation or ACLS   In the event of cardiac or respiratory ARREST Use medication by any route, position, wound care, and other measures to relive pain and suffering. May use oxygen, suction  and manual treatment of airway obstruction as needed for comfort.      12/27/16 1812    Code Status History    Date Active Date Inactive Code Status Order ID Comments User Context   08/28/2016  9:36 PM 09/01/2016  4:56 PM DNR [id number]  Loletha Grayer, MD ED    Advance Directive Documentation   [place] Most Recent Value  Type of Advance Directive  Out of facility DNR (pink MOST or yellow form)  Pre-existing out of facility DNR order (yellow form or pink MOST form)  Yellow form placed in chart (order not valid for inpatient use)  "MOST" Form in Place?  No data          Follow-up Information    BERT Briscoe Burns III, MD Follow up in 1 week(s).   Specialty:  Internal Medicine Contact information: [address]  [place] [address] [place], [place] Follow up in 1 week(s).   Specialty:  Cardiology Contact information: [place] [address] [phone]           Discharge Medications   Allergies as of 12/31/2016      Reactions   Amoxicillin    Cefdinir Diarrhea   Cilostazol    Other reaction(s): Headache   Levofloxacin    Plavix [clopidogrel Bisulfate]    12/12/11 patient reports she has never taken Plavix before.    Sulfur Nausea Only      Medication List    STOP taking these medications   amLODipine 5 MG tablet Commonly known as:  NORVASC     TAKE these medications   ALPRAZolam 0.25 MG tablet Commonly known as:  XANAX Take 0.5 mg by mouth at bedtime as needed for sleep.   aspirin 81 MG EC tablet Take 1 tablet (81 mg total) by mouth daily.   atorvastatin 40 MG tablet Commonly known as:  LIPITOR Take 0.5 tablets (20 mg total) by mouth daily at 6 PM.   carvedilol 6.25 MG tablet Commonly known as:  COREG Take 1 tablet (6.25 mg total) by mouth 2 (two) times daily with a meal.   fluticasone 50 MCG/ACT nasal spray Commonly known as:  FLONASE Place 2 sprays into the  nose daily as needed for allergies.   folic acid 1 MG tablet Commonly known as:  FOLVITE Take 1 mg by mouth daily.   gabapentin 100 MG capsule Commonly known as:  NEURONTIN Take 100 mg by mouth. Takes 1 tablet in the am daily.   gabapentin 400 MG capsule Commonly known as:  NEURONTIN Take 400 mg by mouth at bedtime.   meclizine 25 MG tablet Commonly known as:  ANTIVERT Take 1 tablet (25 mg total) by mouth 3 (three) times daily as needed.   polyvinyl alcohol 1.4 % ophthalmic solution Commonly known as:  LIQUIFILM TEARS Place 2 drops into both eyes 3 (three) times daily as needed (for dry eyes).   vancomycin 50 mg/mL oral solution Commonly known as:  VANCOCIN Take 5 mLs (250 mg total) by mouth every 6 (six) hours. What changed:  additional instructions   warfarin 3 MG tablet Commonly known as:  COUMADIN Take as directed by coumadin clinic What changed:  Another medication with the same name was changed. Make sure you understand how and when to take each.   warfarin 2 MG tablet Commonly known as:  COUMADIN Take 2 tablets (4 mg total) by mouth daily. What changed:  See the new instructions.   ZYRTEC ALLERGY PO Take by mouth daily.          Total Time in preparing paper work, data evaluation and todays exam - 35 minutes  Dustin Flock M.D on 12/31/2016 at 11:33 AM  [facility]   Office  [phone]

## 2016-12-31 NOTE — Progress Notes (Signed)
Pt to be discharged today. Iv and tele removed. disch instructions and prescrips given to pt and family. disch via w.c. Accompanied by family

## 2016-12-31 NOTE — Progress Notes (Signed)
ANTICOAGULATION CONSULT NOTE -  Pharmacy Consult for Heparin/Warfarin Indication: chest pain/ACS  Allergies  Allergen Reactions  . Amoxicillin   . Cefdinir Diarrhea  . Cilostazol     Other reaction(s): Headache  . Levofloxacin   . Plavix [Clopidogrel Bisulfate]     12/12/11 patient reports she has never taken Plavix before.   . Sulfur Nausea Only    Patient Measurements: Height: 5\' 2"  (157.5 cm) Weight: 123 lb 3.2 oz (55.9 kg) IBW/kg (Calculated) : 50.1 Heparin Dosing Weight: 54  Vital Signs: Temp: 97.9 F (36.6 C) (03/03 0900) Temp Source: Oral (03/03 0900) BP: 123/56 (03/03 0900) Pulse Rate: 60 (03/03 0900)  Labs:  Recent Labs  12/28/16 1135  12/29/16 0521 12/29/16 1009  12/30/16 0625 12/30/16 1340 12/31/16 0532  HGB  --   < > 14.6  --   --  14.3  --  14.6  HCT  --   --  41.3  --   --  42.1  --  41.8  PLT  --   --  126*  --   --  129*  --  135*  LABPROT  --   --  21.8*  --   --  20.0*  --  19.9*  INR  --   --  1.87  --   --  1.68  --  1.67  HEPARINUNFRC 0.30  --  0.28*  --   < > 0.65 0.62 0.54  TROPONINI 23.53*  --   --  10.93*  --   --   --   --   < > = values in this interval not displayed.  Estimated Creatinine Clearance: 39.9 mL/min (by C-G formula based on SCr of 0.68 mg/dL).   Assessment: 83 yo F with NSTEMI, Chronic Afib.Patient on Warfarin 2 mg daily at home. Last dose of Warfarin outpatient on 12/26/16, resumed on 3/1. To remain on heparin drip until INR therapeutic. Patient also on Vancomycin ORAL.  Date INR Warfarin Dose Heparin 2/27     2.23 None   Heparin drip startedeparin drip 2/28       - None   Heparin continued 3/1       1.87 2mg    Heparin continued 3/2       1.68 3mg    Heparin continued   Goal of Therapy:  INR 2-3 Heparin level 0.3-0.7 units/ml Monitor platelets by anticoagulation protocol: Yes   Plan:   Heparin level therapeutic, INR remains sub therapeutic and trending down. Continue current orders for heparin 900units/hr.  Will give warfarin 4mg  x 1 tonight. Recheck HL, CBC, and INR with AM labs.   Cheri Guppy, PharmD, BCPS Clinical Pharmacist 12/31/2016,9:47 AM

## 2016-12-31 NOTE — Telephone Encounter (Signed)
Patient needed NTG prescription which was called in  To the pharmacy.

## 2016-12-31 NOTE — Progress Notes (Signed)
Patient: [name] / Admit Date: 12/27/2016 / Date of Encounter: 12/31/2016, 5:47 PM   Hospital Problem List     Active Problems:   NSTEMI (non-ST elevated myocardial infarction) [facility])   Pancreatic mass   Coronary artery disease involving native coronary artery of native heart with unstable angina pectoris ([place])     Subjective   Patient reports that she feels well was morning Denies any significant shortness of breath, chest pain Reports that she ambulated yesterday, felt relatively stable Legs weak but feel she can recover at home Long discussion today concerning anticoagulation, INR 1.6 Received warfarin yesterday and day before it appears 3 mg Typically she takes 2 mg daily Followed in [place]  anticoagulation clinic She is comfortable with her decision to treat her coronary disease medically  Inpatient Medications    Scheduled Meds: . aspirin EC  81 mg Oral Daily  . atorvastatin  40 mg Oral q1800  . docusate sodium  100 mg Oral BID  . folic acid  1 mg Oral Daily  . gabapentin  400 mg Oral QHS  . vancomycin  250 mg Oral Q6H  . warfarin  4 mg Oral ONCE-1800  . Warfarin - Pharmacist Dosing Inpatient   Does not apply q1800   Continuous Infusions:  PRN Meds: acetaminophen **OR** acetaminophen, ALPRAZolam, bisacodyl, ondansetron **OR** ondansetron (ZOFRAN) IV, traZODone   Objective: Telemetry:Review person by myself Paced rhythm, underlying atrial fibrillation Physical Exam: Blood pressure (!) 124/55, pulse 60, temperature 97.6 F (36.4 C), temperature source Oral, resp. rate 16, height 5\' 2"  (1.575 m), weight 123 lb 3.2 oz (55.9 kg), SpO2 97 %. Body mass index is 22.53 kg/m. GEN: Thin, in no acute distress.  HEENT: Grossly normal.  Neck: Supple, no JVD, carotid bruits, or masses. Cardiac: RRR, no murmurs, rubs, or gallops. No clubbing, cyanosis, edema.  Radials/DP/PT 2+ and equal bilaterally.  Respiratory:  Respirations regular and unlabored, mildly  decreased breath sounds secondary to poor inspiratory effort GI: Soft, nontender, nondistended, BS + x 4. MS: no deformity or atrophy. Skin: warm and dry, no rash. Neuro:  Strength and sensation are intact. Psych: AAOx3.  Normal affect.   Intake/Output Summary (Last 24 hours) at 12/31/16 1747 Last data filed at 12/31/16 1410  Gross per 24 hour  Intake           1210.5 ml  Output             1650 ml  Net           -439.5 ml     Labs: CBC  Recent Labs  12/30/16 0625 12/31/16 0532  WBC 7.7 6.9  HGB 14.3 14.6  HCT 42.1 41.8  MCV 93.4 91.9  PLT 129* [id number]*   Basic Metabolic Panel No results for input(s): NA, K, CL, CO2, GLUCOSE, BUN, CREATININE, CALCIUM, MG, PHOS in the last 72 hours. Liver Function Tests No results for input(s): AST, ALT, ALKPHOS, BILITOT, PROT, ALBUMIN in the last 72 hours. No results for input(s): LIPASE, AMYLASE in the last 72 hours. Cardiac Enzymes  Recent Labs  12/29/16 1009  TROPONINI 10.93*   BNP Invalid input(s): POCBNP D-Dimer No results for input(s): DDIMER in the last 72 hours. Hemoglobin A1C No results for input(s): HGBA1C in the last 72 hours. Fasting Lipid Panel  Recent Labs  12/29/16 0521  CHOL 155  HDL 33*  LDLCALC 92  TRIG 152*  CHOLHDL 4.7   Thyroid Function Tests No results for input(s): TSH, T4TOTAL, T3FREE,  THYROIDAB in the last 72 hours.  Invalid input(s): FREET3  Weights: Filed Weights   12/29/16 0500 12/30/16 0610 12/31/16 0444  Weight: 125 lb 10.6 oz (57 kg) 125 lb 11.2 oz (57 kg) 123 lb 3.2 oz (55.9 kg)     Radiology/Studies:  Ct Abdomen Pelvis W Contrast   IMPRESSION: 1. No acute abdominal findings or mass lesions. 2. Enlarging cystic lesion in the pancreatic head. Recommend MRI abdomen/MRCP without with contrast for further evaluation. 3. Enlarged left external iliac lymph node. Given its size biopsy may be indicated. No other lymphadenopathy is demonstrated. 4. Mild cirrhotic changes involving the liver.  5. Advanced atherosclerotic calcifications involving the aorta and branch vessels and also the coronary arteries. Electronically Signed   By: Marijo Sanes M.D.   On: 12/27/2016 15:06   Dg Chest Port 1 View  Result Date: 12/27/2016 . IMPRESSION: COPD.  No pneumonia nor CHF nor other acute cardiopulmonary disease. Thoracic aortic atherosclerosis. Electronically Signed   By: David  Martinique M.D.   On: 12/27/2016 12:45     Assessment and Plan  83 y.o. female  1. NSTEMI - Troponin peaked at 23 Echo with mildly reduced EF as above with HK if inferior and inferolateral wall - Given her age and comorbidities, and patient choice, she has decided to be managed medically She declined cardiac catheterization already on anticoagulation for atrial fibrillation. Avoid other antiplatelet medications.  2. ICM: - EF was 50%. No beta blockers or Ace inhibitors given that her blood pressure is  low side. We will add low doses as an outpatient Would continue Lipitor 40 mg daily  3. Chronic Afib s/p AVN ablation :  V paced in the 60's bpm, underlying atrial fibrillation  INR is 1.6  Recommended she take warfarin 3 mg today and tomorrow then returned to her 2 mg daily long discussion with her and hospitalist service concerning subtherapeutic values today Today will be third day of warfarin Recommended we provide short Lovenox bridge, dose this evening, Sunday morning, Sunday evening She has dermatologic procedure at [facility] on Monday, March 5 We will arrange Coumadin clinic visit on Wednesday, March 7  4. HTN: We will add low-dose beta blocker ACE inhibitor as an outpatient  Blood pressure borderline low    Case discussed with hospitalist service, nursing Long discussion with patient concerning anticoagulation bridging with Lovenox  Total encounter time more than 35 minutes  Greater than 50% was spent in counseling and coordination of care with the patient  Signed, Esmond Plants, MD, Ph.D. [facility] 12/31/2016, 5:47 PM

## 2017-01-01 ENCOUNTER — Other Ambulatory Visit: Payer: Self-pay | Admitting: Cardiovascular Disease

## 2017-01-01 MED ORDER — NITROGLYCERIN 0.4 MG SL SUBL: 0.4000 mg | SUBLINGUAL_TABLET | SUBLINGUAL | 3 refills | Status: AC | PRN

## 2017-01-02 ENCOUNTER — Telehealth: Payer: Self-pay | Admitting: Cardiovascular Disease

## 2017-01-02 NOTE — Telephone Encounter (Signed)
L mom for pt to schedule TCM appt with [facility] or NP, or PA. Pt also needs coumadin appt on 3/7

## 2017-01-04 ENCOUNTER — Ambulatory Visit (INDEPENDENT_AMBULATORY_CARE_PROVIDER_SITE_OTHER): Payer: Medicare Other

## 2017-01-04 DIAGNOSIS — I4891 Unspecified atrial fibrillation: Secondary | ICD-10-CM

## 2017-01-04 DIAGNOSIS — Z5181 Encounter for therapeutic drug level monitoring: Secondary | ICD-10-CM | POA: Diagnosis not present

## 2017-01-04 DIAGNOSIS — I482 Chronic atrial fibrillation, unspecified: Secondary | ICD-10-CM

## 2017-01-04 DIAGNOSIS — Z7901 Long term (current) use of anticoagulants: Secondary | ICD-10-CM | POA: Diagnosis not present

## 2017-01-04 LAB — POCT INR: INR: 6.4

## 2017-01-05 ENCOUNTER — Ambulatory Visit (INDEPENDENT_AMBULATORY_CARE_PROVIDER_SITE_OTHER): Payer: Medicare Other | Admitting: Cardiovascular Disease

## 2017-01-05 ENCOUNTER — Encounter: Payer: Self-pay | Admitting: Cardiovascular Disease

## 2017-01-05 VITALS — BP 128/60 | HR 58 | Ht 62.0 in | Wt 123.2 lb

## 2017-01-05 DIAGNOSIS — Z7901 Long term (current) use of anticoagulants: Secondary | ICD-10-CM | POA: Diagnosis not present

## 2017-01-05 DIAGNOSIS — I482 Chronic atrial fibrillation, unspecified: Secondary | ICD-10-CM

## 2017-01-05 DIAGNOSIS — I214 Non-ST elevation (NSTEMI) myocardial infarction: Secondary | ICD-10-CM | POA: Diagnosis not present

## 2017-01-05 DIAGNOSIS — Z950 Presence of cardiac pacemaker: Secondary | ICD-10-CM | POA: Diagnosis not present

## 2017-01-05 DIAGNOSIS — I442 Atrioventricular block, complete: Secondary | ICD-10-CM

## 2017-01-05 DIAGNOSIS — I25110 Atherosclerotic heart disease of native coronary artery with unstable angina pectoris: Secondary | ICD-10-CM

## 2017-01-05 DIAGNOSIS — I493 Ventricular premature depolarization: Secondary | ICD-10-CM

## 2017-01-05 NOTE — Progress Notes (Signed)
Cardiology Office Note  Date:  01/05/2017   ID:  Tu, [name] 1928/01/13, MRN [id number]  PCP:  Adin Hector, MD   Chief Complaint  Patient presents with  . other    Hpspital follow up. Patient was in the hospital due to having a heart attack last week. Meds reviewed verbally with patient.     HPI:  [name] is a 83 y.o. female with history of permanent atrial fibrillation on warfarin s/p AV node ablation an pacemaker placement,  hypertension, hyperlipidemia, recurrent C. difficile colitis, non-hodgkin's lymphoma, hereditary hemochromatosis, and pancreatic mass, Who presented to the hospital 12/27/2016 with non-STEMI, managed medically at the request of the patient who presents for follow-up in the office after recent hospital discharge 12/31/2016   recent Hospitalization details reviewed with her  She reported having severe chest pain, ache across her chest on arrival to the hospital Initially worked up for an abdominal etiology of her pain, given epigastric tenderness on exam and history of pancreatic mass.  CT abdomen/pelvis showed slight interval enlargement of pancreatic mass but otherwise no acute abnormalities.   Troponin up to 19 Long discussion with patient and family, she deferred on cardiac catheterization Echocardiogram showed ejection fraction 50-55%, inferolateral wall hypokinesis  Yesterday INR 6.4 (was given 3 mg in the hospital, normally takes 2 mg) Underwent Mohs procedure at [facility] 3 days ago Today she feels tired, absolutely worn out Reports that she took 3 nitroglycerin last night for right posterior neck and shoulder pain Did not know if it was chest pain or musculoskeletal  She has not started any exercise program or rehabilitation program Concerned that she may have more blockages, "Do I need a cardiac catheterization" In the hospital she had declined any such procedures, reminded her of the details and her decisions while she was in the  hospital Reports that she feels totally wiped out, no appetite, has to force herself to eat  EKG  review person by myself on today's visit shows ventricularly  paced rhythm 58 bpm, no change from prior EKG in early 2017   PMH:   has a past medical history of Atrial fibrillation ([place]) (07/20/2009); C. difficile colitis; Complete heart block  s/p av ablation; Coronary artery disease; Diverticulitis; GERD (gastroesophageal reflux disease); Hereditary hemochromatosis ([place]); Hyperlipidemia; Hypertension; Non Hodgkin's lymphoma ([place]); Osteoarthritis; Pacemaker syndrome (01/24/2014); and Pacemaker-dual-chamber-Medtronic (04/04/2011).  PSH:    Past Surgical History:  Procedure Laterality Date  . ABDOMINAL HYSTERECTOMY    . ATRIAL ABLATION SURGERY  2008   AV nodal ablation  . BREAST BIOPSY  benign  . CARDIAC CATHETERIZATION    . CARDIOVERSION    . CHOLECYSTECTOMY    . COLONOSCOPY  oct.12, 2012  . INSERT / REPLACE / REMOVE PACEMAKER    . PACEMAKER GENERATOR CHANGE N/A 12/17/2012   Procedure: PACEMAKER GENERATOR CHANGE;  Surgeon: Deboraha Sprang, MD;  Location[facility] CATH LAB;  Service: Cardiovascular;  Laterality: N/A;  . stool transplant    . US ECHOCARDIOGRAPHY  07/2008   EF 55%, mild LVH. No significant valvular abnormalities    Current Outpatient Prescriptions  Medication Sig Dispense Refill  . ALPRAZolam (XANAX) 0.25 MG tablet Take 0.5 mg by mouth at bedtime as needed for sleep.    [name] aspirin EC 81 MG EC tablet Take 1 tablet (81 mg total) by mouth daily.    [name] atorvastatin (LIPITOR) 40 MG tablet Take 0.5 tablets (20 mg total) by mouth daily at 6 PM. 30 tablet 1  .  carvedilol (COREG) 6.25 MG tablet Take 1 tablet (6.25 mg total) by mouth 2 (two) times daily with a meal. 60 tablet 1  . Cetirizine HCl (ZYRTEC ALLERGY PO) Take by mouth daily.     . fluticasone (FLONASE) 50 MCG/ACT nasal spray Place 2 sprays into the nose daily as needed for allergies.     . folic acid (FOLVITE) 1 MG tablet Take 1 mg by  mouth daily.      [name] gabapentin (NEURONTIN) 100 MG capsule Take 100 mg by mouth. Takes 1 tablet in the am daily.    [name] gabapentin (NEURONTIN) 400 MG capsule Take 400 mg by mouth at bedtime.     . meclizine (ANTIVERT) 25 MG tablet Take 1 tablet (25 mg total) by mouth 3 (three) times daily as needed. 30 tablet 0  . nitroGLYCERIN (NITROSTAT) 0.4 MG SL tablet Place 1 tablet (0.4 mg total) under the tongue every 5 (five) minutes as needed for chest pain. 25 tablet 3  . polyvinyl alcohol (LIQUIFILM TEARS) 1.4 % ophthalmic solution Place 2 drops into both eyes 3 (three) times daily as needed (for dry eyes).    . vancomycin (VANCOCIN) 50 mg/mL oral solution Take 5 mLs (250 mg total) by mouth every 6 (six) hours. (Patient taking differently: Take 250 mg by mouth every 6 (six) hours. Qid every 3 days) 240 mL 0  . warfarin (COUMADIN) 3 MG tablet Take as directed by coumadin clinic 20 tablet 3  . enoxaparin (LOVENOX) 120 MG/0.8ML injection Inject 0.37 mLs (55 mg total) into the skin every 12 (twelve) hours. 4 Syringe 0  . warfarin (COUMADIN) 2 MG tablet Take 2 tablets (4 mg total) by mouth daily.     No current facility-administered medications for this visit.      Allergies:   Amoxicillin; Cefdinir; Cilostazol; Levofloxacin; Plavix [clopidogrel bisulfate]; and Sulfur   Social History:  The patient  reports that she quit smoking about 22 years ago. She has never used smokeless tobacco. She reports that she does not drink alcohol or use drugs.   Family History:   family history includes Cancer in her mother; Liver disease in her father.    Review of Systems: Review of Systems  Constitutional: Positive for malaise/fatigue.  Respiratory: Negative.   Cardiovascular: Positive for chest pain.  Gastrointestinal: Negative.   Musculoskeletal: Negative.        Right posterior shoulder pain  Neurological: Positive for weakness.  Psychiatric/Behavioral: Negative.   All other systems reviewed and are  negative.    PHYSICAL EXAM: VS:  BP 128/60 (BP Location: Left Arm, Patient Position: Sitting, Cuff Size: Normal)   Pulse (!) 58   Ht 5\' 2"  (1.575 m)   Wt 123 lb 4 oz (55.9 kg)   BMI 22.54 kg/m  , BMI Body mass index is 22.54 kg/m. GEN: Well nourished, well developed, in no acute distress  HEENT: normal ,  bandage on right side of her nose  Neck: no JVD, carotid bruits, or masses Cardiac: RRR; no murmurs, rubs, or gallops,no edema  Respiratory:  clear to auscultation bilaterally, normal work of breathing GI: soft, nontender, nondistended, + BS MS: no deformity or atrophy  Skin: warm and dry, no rash Neuro:  Strength and sensation are intact Psych: euthymic mood, full affect    Recent Labs: 08/29/2016: Magnesium 1.9; TSH 1.985 12/27/2016: ALT 37 12/28/2016: BUN 10; Creatinine, Ser 0.68; Potassium 3.9; Sodium 136 12/31/2016: Hemoglobin 14.6; Platelets 135    Lipid Panel Lab Results  Component Value  Date   CHOL 155 12/29/2016   HDL 33 (L) 12/29/2016   LDLCALC 92 12/29/2016   TRIG 152 (H) 12/29/2016      Wt Readings from Last 3 Encounters:  01/05/17 123 lb 4 oz (55.9 kg)  12/31/16 123 lb 3.2 oz (55.9 kg)  09/30/16 118 lb 8 oz (53.8 kg)       ASSESSMENT AND PLAN:  Coronary artery disease involving native coronary artery of native heart with unstable angina pectoris ([place]) - Plan: EKG 12-Lead Long discussion with her about recent events, I suspect she had occlusion of OM branch or distal RCA to cause inferolateral wall hypokinesis Stress test planned as below  NSTEMI (non-ST elevated myocardial infarction) ([place]) - Plan: EKG 12-Lead Long discussion about recent events Recommended if she has no dramatic improvement in her energy level, And the way she feels that we enroll her in cardiac rehabilitation in the next several weeks. We'll place an order for rehabilitation and she can take a tour of the facilities, discuss the program with the cardiac therapists. She continues  to have some vague chest discomfort, likely musculoskeletal in nature Recommended pharmacologic Myoview given recent MI She did ask if she should have a cardiac catheterization. Long discussion with her concerning risk and benefit of stress testing versus catheterization. In the hospital she declined catheterization as it was felt to be too risky. At this point would not proceed with cardiac catheterization as her non-STEMI has the fall and resolving and now with minimal symptoms.  Complete atrioventricular block ([place]) - Plan: EKG 12-Lead She has pacemaker, followed by EP   Chronic atrial fibrillation ([place]) - Plan: EKG 12-Lead Tolerating anticoagulation, adjustments to her warfarin as above  Pacemaker-dual-chamber-Medtronic - Plan: EKG 12-Lead She has follow-up with Jolyn Nap next month  Long term current use of anticoagulant - Plan: EKG 12-Lead INR 6, she has been instructed to hold warfarin for 3 days then restart at 2 mg . Hospitalist service started her on 3 mg daily rather than her normal 2 mg daily    Total encounter time more than 45 minutes  Greater than 50% was spent in counseling and coordination of care with the patient   Disposition:   F/U  6 months  Orders Placed This Encounter  Procedures  . EKG 12-Lead     Signed, Esmond Plants, M.D., Ph.D. 01/05/2017  [place], [place]

## 2017-01-05 NOTE — Patient Instructions (Addendum)
Medication Instructions:   No medication changes made  Labwork:  No new labs needed  Testing/Procedures:  We will schedule a lexiscan myoview for chest pain, recent NSTEMI.  [place]  Your caregiver has ordered a Stress Test with nuclear imaging. The purpose of this test is to evaluate the blood supply to your heart muscle. This procedure is referred to as a "Non-Invasive Stress Test." This is because other than having an IV started in your vein, nothing is inserted or "invades" your body. Cardiac stress tests are done to find areas of poor blood flow to the heart by determining the extent of coronary artery disease (CAD). Some patients exercise on a treadmill, which naturally increases the blood flow to your heart, while others who are  unable to walk on a treadmill due to physical limitations have a pharmacologic/chemical stress agent called Lexiscan . This medicine will mimic walking on a treadmill by temporarily increasing your coronary blood flow.   Please note: these test may take anywhere between 2-4 hours to complete  PLEASE REPORT TO [place] AT THE FIRST DESK WILL DIRECT YOU WHERE TO GO  Date of Procedure:______Wednesday, March 14____  Arrival Time for Procedure:_____9:15 am_____  Instructions regarding medication:   __X__:  Hold CARVEDILOL the night before and morning of procedure  How to prepare for your Myoview test:  1. Do not eat or drink after midnight 2. No caffeine for 24 hours prior to test 3. No smoking 24 hours prior to test. 4. Your medication may be taken with water.  If your doctor stopped a medication because of this test, do not take that medication. 5. Ladies, please do not wear dresses.  Skirts or pants are appropriate. Please wear a short sleeve shirt. 6. No perfume, cologne or lotion.  I recommend watching educational videos on topics of interest to you at:       [email or web address]  Enter code: [email or web address]     Follow-Up: It was a pleasure seeing you in the office today. Please call us if you have new issues that need to be addressed before your next appt.  [phone]  Your physician wants you to follow-up in: 3 months.  You will receive a reminder letter in the mail two months in advance. If you don't receive a letter, please call our office to schedule the follow-up appointment.  If you need a refill on your cardiac medications before your next appointment, please call your pharmacy.    Pharmacologic Stress Electrocardiogram Introduction A pharmacologic stress electrocardiogram is a heart (cardiac) test that uses nuclear imaging to evaluate the blood supply to your heart. This test may also be called a pharmacologic stress electrocardiography. Pharmacologic means that a medicine is used to increase your heart rate and blood pressure. This stress test is done to find areas of poor blood flow to the heart by determining the extent of coronary artery disease (CAD). Some people exercise on a treadmill, which naturally increases the blood flow to the heart. For those people unable to exercise on a treadmill, a medicine is used. This medicine stimulates your heart and will cause your heart to beat harder and more quickly, as if you were exercising. Pharmacologic stress tests can help determine:  The adequacy of blood flow to your heart during increased levels of activity in order to clear you for discharge home.  The extent of coronary artery blockage caused by CAD.  Your prognosis if you have suffered a heart attack.  The effectiveness of cardiac procedures done, such as an angioplasty, which can increase the circulation in your coronary arteries.  Causes of chest pain or pressure. LET [facility] CARE PROVIDER KNOW ABOUT:  Any allergies you have.  All medicines you are taking, including vitamins, herbs, eye drops, creams, and over-the-counter medicines.  Previous problems you or  members of your family have had with the use of anesthetics.  Any blood disorders you have.  Previous surgeries you have had.  Medical conditions you have.  Possibility of pregnancy, if this applies.  If you are currently breastfeeding. RISKS AND COMPLICATIONS Generally, this is a safe procedure. However, as with any procedure, complications can occur. Possible complications include:  You develop pain or pressure in the following areas:  Chest.  Jaw or neck.  Between your shoulder blades.  Radiating down your left arm.  Headache.  Dizziness or light-headedness.  Shortness of breath.  Increased or irregular heartbeat.  Low blood pressure.  Nausea or vomiting.  Flushing.  Redness going up the arm and slight pain during injection of medicine.  Heart attack (rare). BEFORE THE PROCEDURE  Avoid all forms of caffeine for 24 hours before your test or as directed by your health care provider. This includes coffee, tea (even decaffeinated tea), caffeinated sodas, chocolate, cocoa, and certain pain medicines.  Follow your health care provider's instructions regarding eating and drinking before the test.  Take your medicines as directed at regular times with water unless instructed otherwise. Exceptions may include:  If you have diabetes, ask how you are to take your insulin or pills. It is common to adjust insulin dosing the morning of the test.  If you are taking beta-blocker medicines, it is important to talk to your health care provider about these medicines well before the date of your test. Taking beta-blocker medicines may interfere with the test. In some cases, these medicines need to be changed or stopped 24 hours or more before the test.  If you wear a nitroglycerin patch, it may need to be removed prior to the test. Ask your health care provider if the patch should be removed before the test.  If you use an inhaler for any breathing condition, bring it with you  to the test.  If you are an outpatient, bring a snack so you can eat right after the stress phase of the test.  Do not smoke for 4 hours prior to the test or as directed by your health care provider.  Do not apply lotions, powders, creams, or oils on your chest prior to the test.  Wear comfortable shoes and clothing. Let your health care provider know if you were unable to complete or follow the preparations for your test. PROCEDURE  Multiple patches (electrodes) will be put on your chest. If needed, small areas of your chest may be shaved to get better contact with the electrodes. Once the electrodes are attached to your body, multiple wires will be attached to the electrodes, and your heart rate will be monitored.  An IV access will be started. A nuclear trace (isotope) is given. The isotope may be given intravenously, or it may be swallowed. Nuclear refers to several types of radioactive isotopes, and the nuclear isotope lights up the arteries so that the nuclear images are clear. The isotope is absorbed by your body. This results in low radiation exposure.  A resting nuclear image is taken to show how your heart functions at rest.  A medicine is given through  the IV access.  A second scan is done about 1 hour after the medicine injection and determines how your heart functions under stress.  During this stress phase, you will be connected to an electrocardiogram machine. Your blood pressure and oxygen levels will be monitored. What to expect after the procedure  Your heart rate and blood pressure will be monitored after the test.  You may return to your normal schedule, including diet,activities, and medicines, unless your health care provider tells you otherwise. This information is not intended to replace advice given to you by your health care provider. Make sure you discuss any questions you have with your health care provider. Document Released: 03/05/2009 Document Revised:  03/24/2016 Document Reviewed: 04/25/2016 Elsevier Interactive Patient Education  2017 [employer].

## 2017-01-11 ENCOUNTER — Encounter
Admission: RE | Admit: 2017-01-11 | Discharge: 2017-01-11 | Disposition: A | Discharge status: Home / Self Care | Payer: Medicare Other | Source: Ambulatory Visit | Attending: Cardiovascular Disease | Admitting: Cardiovascular Disease

## 2017-01-11 ENCOUNTER — Ambulatory Visit (INDEPENDENT_AMBULATORY_CARE_PROVIDER_SITE_OTHER): Payer: Medicare Other

## 2017-01-11 DIAGNOSIS — I214 Non-ST elevation (NSTEMI) myocardial infarction: Secondary | ICD-10-CM | POA: Diagnosis not present

## 2017-01-11 DIAGNOSIS — R079 Chest pain, unspecified: Secondary | ICD-10-CM | POA: Insufficient documentation

## 2017-01-11 DIAGNOSIS — I442 Atrioventricular block, complete: Secondary | ICD-10-CM | POA: Diagnosis not present

## 2017-01-11 DIAGNOSIS — I25110 Atherosclerotic heart disease of native coronary artery with unstable angina pectoris: Secondary | ICD-10-CM | POA: Diagnosis not present

## 2017-01-11 DIAGNOSIS — I493 Ventricular premature depolarization: Secondary | ICD-10-CM | POA: Diagnosis not present

## 2017-01-11 DIAGNOSIS — I482 Chronic atrial fibrillation, unspecified: Secondary | ICD-10-CM

## 2017-01-11 DIAGNOSIS — I4891 Unspecified atrial fibrillation: Secondary | ICD-10-CM

## 2017-01-11 DIAGNOSIS — Z5181 Encounter for therapeutic drug level monitoring: Secondary | ICD-10-CM | POA: Diagnosis not present

## 2017-01-11 DIAGNOSIS — Z7901 Long term (current) use of anticoagulants: Secondary | ICD-10-CM | POA: Diagnosis not present

## 2017-01-11 DIAGNOSIS — Z950 Presence of cardiac pacemaker: Secondary | ICD-10-CM | POA: Diagnosis not present

## 2017-01-11 DIAGNOSIS — I2510 Atherosclerotic heart disease of native coronary artery without angina pectoris: Secondary | ICD-10-CM | POA: Insufficient documentation

## 2017-01-11 LAB — NM MYOCAR MULTI W/SPECT W/WALL MOTION / EF
CHL CUP NUCLEAR SDS: 0
CHL CUP RESTING HR STRESS: 70 {beats}/min
CSEPPHR: 61 {beats}/min
LVDIAVOL: 64 mL (ref 46–106)
LVSYSVOL: 28 mL
Percent HR: 45 %
SRS: 7
SSS: 5
TID: 1

## 2017-01-11 LAB — POCT INR: INR: 1.8

## 2017-01-11 MED ORDER — TECHNETIUM TC 99M TETROFOSMIN IV KIT
30.4600 | PACK | Freq: Once | INTRAVENOUS | Status: AC | PRN
  Administered 2017-01-11: 30.46 via INTRAVENOUS

## 2017-01-11 MED ORDER — REGADENOSON 0.4 MG/5ML IV SOLN
0.4000 mg | Freq: Once | INTRAVENOUS | Status: AC
  Administered 2017-01-11: 0.4 mg via INTRAVENOUS

## 2017-01-11 MED ORDER — TECHNETIUM TC 99M TETROFOSMIN IV KIT
11.4700 | PACK | Freq: Once | INTRAVENOUS | Status: AC | PRN
  Administered 2017-01-11: 11.47 via INTRAVENOUS

## 2017-01-25 ENCOUNTER — Ambulatory Visit (INDEPENDENT_AMBULATORY_CARE_PROVIDER_SITE_OTHER): Payer: Medicare Other

## 2017-01-25 DIAGNOSIS — I4891 Unspecified atrial fibrillation: Secondary | ICD-10-CM

## 2017-01-25 DIAGNOSIS — I482 Chronic atrial fibrillation, unspecified: Secondary | ICD-10-CM

## 2017-01-25 DIAGNOSIS — Z5181 Encounter for therapeutic drug level monitoring: Secondary | ICD-10-CM | POA: Diagnosis not present

## 2017-01-25 DIAGNOSIS — I25110 Atherosclerotic heart disease of native coronary artery with unstable angina pectoris: Secondary | ICD-10-CM

## 2017-01-25 DIAGNOSIS — Z7901 Long term (current) use of anticoagulants: Secondary | ICD-10-CM

## 2017-01-25 LAB — POCT INR: INR: 2.7

## 2017-02-15 ENCOUNTER — Ambulatory Visit (INDEPENDENT_AMBULATORY_CARE_PROVIDER_SITE_OTHER): Payer: Medicare Other

## 2017-02-15 DIAGNOSIS — I25110 Atherosclerotic heart disease of native coronary artery with unstable angina pectoris: Secondary | ICD-10-CM

## 2017-02-15 DIAGNOSIS — Z7901 Long term (current) use of anticoagulants: Secondary | ICD-10-CM

## 2017-02-15 DIAGNOSIS — Z5181 Encounter for therapeutic drug level monitoring: Secondary | ICD-10-CM

## 2017-02-15 DIAGNOSIS — I482 Chronic atrial fibrillation, unspecified: Secondary | ICD-10-CM

## 2017-02-15 DIAGNOSIS — I4891 Unspecified atrial fibrillation: Secondary | ICD-10-CM

## 2017-02-15 LAB — POCT INR: INR: 2

## 2017-03-14 ENCOUNTER — Other Ambulatory Visit: Payer: Self-pay

## 2017-03-14 ENCOUNTER — Ambulatory Visit (INDEPENDENT_AMBULATORY_CARE_PROVIDER_SITE_OTHER): Payer: Medicare Other

## 2017-03-14 ENCOUNTER — Encounter: Payer: Self-pay | Admitting: Internal Medicine

## 2017-03-14 ENCOUNTER — Ambulatory Visit (INDEPENDENT_AMBULATORY_CARE_PROVIDER_SITE_OTHER): Payer: Medicare Other | Admitting: Internal Medicine

## 2017-03-14 VITALS — BP 122/62 | HR 60 | Ht 62.0 in | Wt 118.8 lb

## 2017-03-14 DIAGNOSIS — Z950 Presence of cardiac pacemaker: Secondary | ICD-10-CM | POA: Diagnosis not present

## 2017-03-14 DIAGNOSIS — I482 Chronic atrial fibrillation, unspecified: Secondary | ICD-10-CM

## 2017-03-14 DIAGNOSIS — I442 Atrioventricular block, complete: Secondary | ICD-10-CM

## 2017-03-14 DIAGNOSIS — Z5181 Encounter for therapeutic drug level monitoring: Secondary | ICD-10-CM

## 2017-03-14 DIAGNOSIS — I4821 Permanent atrial fibrillation: Secondary | ICD-10-CM

## 2017-03-14 DIAGNOSIS — I25110 Atherosclerotic heart disease of native coronary artery with unstable angina pectoris: Secondary | ICD-10-CM

## 2017-03-14 DIAGNOSIS — Z7901 Long term (current) use of anticoagulants: Secondary | ICD-10-CM

## 2017-03-14 LAB — CUP PACEART INCLINIC DEVICE CHECK
Battery Voltage: 2.79 V
Date Time Interrogation Session: 20180515135614
Implantable Lead Implant Date: 20000718
Implantable Lead Location: 753859
Implantable Lead Location: 753860
Implantable Pulse Generator Implant Date: 20140217
Lead Channel Impedance Value: 509 Ohm
Lead Channel Pacing Threshold Amplitude: 1 V
Lead Channel Pacing Threshold Pulse Width: 0.4 ms
Lead Channel Setting Pacing Amplitude: 2.5 V
MDC IDC LEAD IMPLANT DT: 20000718
MDC IDC MSMT BATTERY IMPEDANCE: 377 Ohm
MDC IDC MSMT BATTERY REMAINING LONGEVITY: 103 mo
MDC IDC MSMT LEADCHNL RA IMPEDANCE VALUE: 67 Ohm
MDC IDC SET LEADCHNL RV PACING PULSEWIDTH: 0.4 ms
MDC IDC SET LEADCHNL RV SENSING SENSITIVITY: 5.6 mV
MDC IDC STAT BRADY RV PERCENT PACED: 99 %

## 2017-03-14 LAB — POCT INR: INR: 2.1

## 2017-03-14 NOTE — Patient Instructions (Signed)
Medication Instructions: - Your physician recommends that you continue on your current medications as directed. Please refer to the Current Medication list given to you today.  Labwork: - none ordered  Procedures/Testing: - none ordered  Follow-Up: - Remote monitoring is used to monitor your Pacemaker of ICD from home. This monitoring reduces the number of office visits required to check your device to one time per year. It allows [place] to keep an eye on the functioning of your device to ensure it is working properly. You are scheduled for a device check from home on 06/13/17. You may send your transmission at any time that day. If you have a wireless device, the transmission will be sent automatically. After your physician reviews your transmission, you will receive a postcard with your next transmission date.  - Your physician wants you to follow-up in: 6 months with Dr. Caryl Comes. You will receive a reminder letter in the mail two months in advance. If you don't receive a letter, please call our office to schedule the follow-up appointment.   Any Additional Special Instructions Will Be Listed Below (If Applicable).     If you need a refill on your cardiac medications before your next appointment, please call your pharmacy.

## 2017-03-14 NOTE — Progress Notes (Signed)
Patient Care Team: Adin Hector, MD as PCP - General (Internal Medicine) Minna Merritts, MD as Consulting Physician (Cardiology)   HPI  [name] is a 83 y.o. female With a history of permanent atrial fibrillation status post AV junction ablation for uncontrolled ventricular response this subsequently implanted pacemaker. She underwent generator replacement February 2014 which was inadvertently associated with failure to activate rate response. This was subsequently activated: there has  Been  overresponsiveness of her sensor causing her to have a sensation of tachypalpitations which was addressed by reprogramming;  More recently we had increased rate response 2/2 poor exercise tolerance.  This is much improved      She has a history of remote catheterization in the 1990s. At that time her arteries were clean.   stress test in 2001 that was nonischemic and Myoview 2014 that was unrevealing;  echocardiogram 2013 demonstrated normal LV function  Interval NSTEMI w Tn 23  Echo EF 50% with infer WMA and decision to treat with ASA 2/2 age and comorbidities   No interval chest pain or shortness of breath; dyspnea on exertion was improved with reprogramming of her rate response at the last visit No edema  No bleeding issues on asa and coumadin  She remains on vancomycin for C. difficile dating back to 9/17     Past Medical History:  Diagnosis Date  . Atrial fibrillation ([employer]) 07/20/2009   Qualifier: Diagnosis of  By: Caryl Comes, MD, Leonidas Romberg Mack Guise   . C. difficile colitis    feces transplant  . Complete heart block  s/p av ablation    with atrial fibrillation. Pt is s/p AV nodal ablation due to permanent and symptomatic atrial fibrillation.   . Coronary artery disease    Nonobstructive  . Diverticulitis   . GERD (gastroesophageal reflux disease)   . Hereditary hemochromatosis ([place])    Phlebotomized in the past, not getting phlebotomy now  . Hyperlipidemia   .  Hypertension   . Non Hodgkin's lymphoma ([place])    stable; followed by an oncologist  . Osteoarthritis   . 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 Pacemaker-dual-chamber-Medtronic 04/04/2011    Past Surgical History:  Procedure Laterality Date  . ABDOMINAL HYSTERECTOMY    . ATRIAL ABLATION SURGERY  2008   AV nodal ablation  . BREAST BIOPSY  benign  . CARDIAC CATHETERIZATION    . CARDIOVERSION    . CHOLECYSTECTOMY    . COLONOSCOPY  oct.12, 2012  . INSERT / REPLACE / REMOVE PACEMAKER    . PACEMAKER GENERATOR CHANGE N/A 12/17/2012   Procedure: PACEMAKER GENERATOR CHANGE;  Surgeon: Deboraha Sprang, MD;  [facility] C[facility] LAB;  Service: Cardiovascular;  Laterality: N/A;  . stool transplant    . US ECHOCARDIOGRAPHY  07/2008   EF 55%, mild LVH. No significant valvular abnormalities    Current Outpatient Prescriptions  Medication Sig Dispense Refill  . ALPRAZolam (XANAX) 0.25 MG tablet Take 0.5 mg by mouth at bedtime as needed for sleep.    [name] aspirin EC 81 MG EC tablet Take 1 tablet (81 mg total) by mouth daily.    [name] atorvastatin (LIPITOR) 40 MG tablet Take 0.5 tablets (20 mg total) by mouth daily at 6 PM. 30 tablet 1  . carvedilol (COREG) 6.25 MG tablet Take 1 tablet (6.25 mg total) by mouth 2 (two) times daily with a meal. 60 tablet 1  . Cetirizine HCl (ZYRTEC ALLERGY PO) Take by mouth daily.     [name]  fluticasone (FLONASE) 50 MCG/ACT nasal spray Place 2 sprays into the nose daily as needed for allergies.     . folic acid (FOLVITE) 1 MG tablet Take 1 mg by mouth daily.      [name] gabapentin (NEURONTIN) 400 MG capsule Take 400 mg by mouth at bedtime.     . meclizine (ANTIVERT) 25 MG tablet Take 1 tablet (25 mg total) by mouth 3 (three) times daily as needed. 30 tablet 0  . nitroGLYCERIN (NITROSTAT) 0.4 MG SL tablet Place 1 tablet (0.4 mg total) under the tongue every 5 (five) minutes as needed for chest pain. 25 tablet 3  . polyvinyl alcohol (LIQUIFILM TEARS) 1.4 % ophthalmic solution Place 2 drops into  both eyes 3 (three) times daily as needed (for dry eyes).    . vancomycin (VANCOCIN) 50 mg/mL oral solution Take 5 mLs (250 mg total) by mouth every 6 (six) hours. (Patient taking differently: Take 250 mg by mouth every 6 (six) hours. Qid every 3 days) 240 mL 0  . warfarin (COUMADIN) 3 MG tablet Take as directed by coumadin clinic 20 tablet 3  . enoxaparin (LOVENOX) 120 MG/0.8ML injection Inject 0.37 mLs (55 mg total) into the skin every 12 (twelve) hours. 4 Syringe 0  . warfarin (COUMADIN) 2 MG tablet Take 2 tablets (4 mg total) by mouth daily.     No current facility-administered medications for this visit.     Allergies  Allergen Reactions  . Amoxicillin   . Cefdinir Diarrhea  . Cilostazol     Other reaction(s): Headache  . Levofloxacin   . Plavix [Clopidogrel Bisulfate]     12/12/11 patient reports she has never taken Plavix before.   . Sulfur Nausea Only    Review of Systems negative except from HPI and PMH  Physical Exam BP 122/62 (BP Location: Left Arm, Patient Position: Sitting, Cuff Size: Normal)   Pulse 60   Ht 5\' 2"  (1.575 m)   Wt 118 lb 12 oz (53.9 kg)   BMI 21.72 kg/m  Well developed and nourished in no acute distress HENT normal Neck supple with JVP-flat Clear Device pocket well healed; without hematoma or erythema.  There is no tethering  Regular rate and rhythm, no murmurs or gallops Abd-soft with active BS No Clubbing cyanosis edema Skin-warm and dry A & Oriented  Grossly normal sensory and motor function  ECG demonstrates ventricular pacing with complete heart block and underlying afib   Assessment and  Plan  Complete heart block S./P. AV nodal ablation  Atrial fibrillation permanent  Chronotropic incompetence  Coronary artery disease with interval MI  C. difficile colitis     She is doing quite well. There has been no interval chest pain. We will continue her on her current medicines  No bleeding issues on her anticoagulation and  antiplatelet therapy  Chronotropic competence is improved with reprogramming  She has no cardiac contraindications to fecal transplant for C. difficile colitis therapy

## 2017-03-17 ENCOUNTER — Other Ambulatory Visit: Payer: Self-pay | Admitting: Internal Medicine

## 2017-03-17 DIAGNOSIS — K8689 Other specified diseases of pancreas: Secondary | ICD-10-CM

## 2017-03-17 DIAGNOSIS — R748 Abnormal levels of other serum enzymes: Secondary | ICD-10-CM

## 2017-03-31 ENCOUNTER — Ambulatory Visit: Payer: Medicare Other

## 2017-03-31 ENCOUNTER — Ambulatory Visit
Admission: RE | Admit: 2017-03-31 | Discharge: 2017-03-31 | Disposition: A | Discharge status: Home / Self Care | Payer: Medicare Other | Source: Ambulatory Visit | Attending: Internal Medicine | Admitting: Internal Medicine

## 2017-03-31 DIAGNOSIS — K869 Disease of pancreas, unspecified: Secondary | ICD-10-CM | POA: Diagnosis present

## 2017-03-31 DIAGNOSIS — K8689 Other specified diseases of pancreas: Secondary | ICD-10-CM

## 2017-03-31 DIAGNOSIS — R748 Abnormal levels of other serum enzymes: Secondary | ICD-10-CM | POA: Diagnosis not present

## 2017-04-03 ENCOUNTER — Inpatient Hospital Stay: Payer: Medicare Other

## 2017-04-03 ENCOUNTER — Inpatient Hospital Stay: Payer: Medicare Other | Attending: Oncology | Admitting: Oncology

## 2017-04-03 DIAGNOSIS — I10 Essential (primary) hypertension: Secondary | ICD-10-CM | POA: Diagnosis not present

## 2017-04-03 DIAGNOSIS — Z809 Family history of malignant neoplasm, unspecified: Secondary | ICD-10-CM | POA: Insufficient documentation

## 2017-04-03 DIAGNOSIS — Z7901 Long term (current) use of anticoagulants: Secondary | ICD-10-CM | POA: Insufficient documentation

## 2017-04-03 DIAGNOSIS — I4891 Unspecified atrial fibrillation: Secondary | ICD-10-CM | POA: Diagnosis not present

## 2017-04-03 DIAGNOSIS — Z79899 Other long term (current) drug therapy: Secondary | ICD-10-CM | POA: Diagnosis not present

## 2017-04-03 DIAGNOSIS — E785 Hyperlipidemia, unspecified: Secondary | ICD-10-CM | POA: Diagnosis not present

## 2017-04-03 DIAGNOSIS — M1990 Unspecified osteoarthritis, unspecified site: Secondary | ICD-10-CM | POA: Insufficient documentation

## 2017-04-03 DIAGNOSIS — Z87891 Personal history of nicotine dependence: Secondary | ICD-10-CM | POA: Diagnosis not present

## 2017-04-03 DIAGNOSIS — Z8719 Personal history of other diseases of the digestive system: Secondary | ICD-10-CM | POA: Insufficient documentation

## 2017-04-03 DIAGNOSIS — C9110 Chronic lymphocytic leukemia of B-cell type not having achieved remission: Secondary | ICD-10-CM | POA: Insufficient documentation

## 2017-04-03 DIAGNOSIS — K219 Gastro-esophageal reflux disease without esophagitis: Secondary | ICD-10-CM | POA: Diagnosis not present

## 2017-04-03 DIAGNOSIS — I2510 Atherosclerotic heart disease of native coronary artery without angina pectoris: Secondary | ICD-10-CM | POA: Insufficient documentation

## 2017-04-03 DIAGNOSIS — Z7982 Long term (current) use of aspirin: Secondary | ICD-10-CM | POA: Insufficient documentation

## 2017-04-03 LAB — CBC WITH DIFFERENTIAL/PLATELET
Basophils Absolute: 0.1 10*3/uL (ref 0–0.1)
Basophils Relative: 1 %
EOS PCT: 1 %
Eosinophils Absolute: 0.1 10*3/uL (ref 0–0.7)
HCT: 51.7 % — ABNORMAL HIGH (ref 35.0–47.0)
Hemoglobin: 17.9 g/dL — ABNORMAL HIGH (ref 12.0–16.0)
LYMPHS ABS: 2.4 10*3/uL (ref 1.0–3.6)
Lymphocytes Relative: 31 %
MCH: 32 pg (ref 26.0–34.0)
MCHC: 34.6 g/dL (ref 32.0–36.0)
MCV: 92.6 fL (ref 80.0–100.0)
MONO ABS: 0.9 10*3/uL (ref 0.2–0.9)
MONOS PCT: 12 %
Neutro Abs: 4.1 10*3/uL (ref 1.4–6.5)
Neutrophils Relative %: 55 %
PLATELETS: 172 10*3/uL (ref 150–440)
RBC: 5.58 MIL/uL — ABNORMAL HIGH (ref 3.80–5.20)
RDW: 13.3 % (ref 11.5–14.5)
WBC: 7.5 10*3/uL (ref 3.6–11.0)

## 2017-04-03 LAB — IRON AND TIBC
IRON: 194 ug/dL — AB (ref 28–170)
Saturation Ratios: 64 % — ABNORMAL HIGH (ref 10.4–31.8)
TIBC: 305 ug/dL (ref 250–450)
UIBC: 112 ug/dL

## 2017-04-03 LAB — FERRITIN: FERRITIN: 184 ng/mL (ref 11–307)

## 2017-04-03 NOTE — Progress Notes (Signed)
Patient here today for follow up.   

## 2017-04-03 NOTE — Progress Notes (Signed)
[facility]  [phone] [phone]  ID: Effie Berkshire OB: 1927-12-12  MR#: [id number]  RSW#:[id number]  Patient Care Team: Adin Hector, MD as PCP - General (Internal Medicine) Minna Merritts, MD as Consulting Physician (Cardiology)  CHIEF COMPLAINT: Hereditary hemochromatosis, CLL.  INTERVAL HISTORY: Patient returns to clinic today for repeat laboratory work and further evaluation. She continues to feel well and is asymptomatic. She has no neurologic complaints. She denies any weakness or fatigue. She has a good appetite and denies weight loss. She denies any recent fevers or illnesses. She has no chest pain or shortness of breath. She denies any nausea, vomiting, constipation, or diarrhea. She has no urinary complaints. Patient feels at her baseline and offers no specific complaints today.  REVIEW OF SYSTEMS:   Review of Systems  Constitutional: Negative.  Negative for fever, malaise/fatigue and weight loss.  Respiratory: Negative.  Negative for cough.   Cardiovascular: Negative.  Negative for chest pain and leg swelling.  Gastrointestinal: Negative.  Negative for abdominal pain, blood in stool and melena.  Genitourinary: Negative.   Neurological: Negative.  Negative for sensory change and weakness.  Psychiatric/Behavioral: Negative.  The patient is not nervous/anxious.     As per HPI. Otherwise, a complete review of systems is negative.  PAST MEDICAL HISTORY: Past Medical History:  Diagnosis Date  . Atrial fibrillation ([place]) 07/20/2009   Qualifier: Diagnosis of  By: Caryl Comes, MD, Leonidas Romberg Mack Guise   . C. difficile colitis    feces transplant  . Complete heart block  s/p av ablation    with atrial fibrillation. Pt is s/p AV nodal ablation due to permanent and symptomatic atrial fibrillation.   . Coronary artery disease    Nonobstructive  . Diverticulitis   . GERD (gastroesophageal reflux disease)   . Hereditary hemochromatosis  ([place])    Phlebotomized in the past, not getting phlebotomy now  . Hyperlipidemia   . Hypertension   . Non Hodgkin's lymphoma ([place])    stable; followed by an oncologist  . Osteoarthritis   . 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 Pacemaker-dual-chamber-Medtronic 04/04/2011    PAST SURGICAL HISTORY: Past Surgical History:  Procedure Laterality Date  . ABDOMINAL HYSTERECTOMY    . ATRIAL ABLATION SURGERY  2008   AV nodal ablation  . BREAST BIOPSY  benign  . CARDIAC CATHETERIZATION    . CARDIOVERSION    . CHOLECYSTECTOMY    . COLONOSCOPY  oct.12, 2012  . INSERT / REPLACE / REMOVE PACEMAKER    . PACEMAKER GENERATOR CHANGE N/A 12/17/2012   Procedure: PACEMAKER GENERATOR CHANGE;  Surgeon: Deboraha Sprang, MD;  Location: [facility] CATH LAB;  Service: Cardiovascular;  Laterality: N/A;  . stool transplant    . US ECHOCARDIOGRAPHY  07/2008   EF 55%, mild LVH. No significant valvular abnormalities    FAMILY HISTORY: Family History  Problem Relation Age of Onset  . Liver disease Father   . Cancer Mother   . Prostate cancer Neg Hx   . Kidney disease Neg Hx   . Kidney cancer Neg Hx   . Breast cancer Neg Hx   . Ovarian cancer Neg Hx   . Colon cancer Neg Hx     ADVANCED DIRECTIVES (Y/N):  N  HEALTH MAINTENANCE: Social History  Substance Use Topics  . Smoking status: Former Smoker    Quit date: 1996  . Smokeless tobacco: Never Used  . Alcohol use No     Colonoscopy:  PAP:  Bone density:  Lipid panel:  Allergies  Allergen Reactions  . Amoxicillin   . Cefdinir Diarrhea  . Cilostazol     Other reaction(s): Headache  . Levofloxacin   . Plavix [Clopidogrel Bisulfate]     12/12/11 patient reports she has never taken Plavix before.   . Sulfur Nausea Only    Current Outpatient Prescriptions  Medication Sig Dispense Refill  . ALPRAZolam (XANAX) 0.25 MG tablet Take 0.5 mg by mouth at bedtime as needed for sleep.    [name] aspirin EC 81 MG EC tablet Take 1 tablet (81 mg total) by mouth daily.      . Cetirizine HCl (ZYRTEC ALLERGY PO) Take by mouth daily.     . fluticasone (FLONASE) 50 MCG/ACT nasal spray Place 2 sprays into the nose daily as needed for allergies.     . folic acid (FOLVITE) 1 MG tablet Take 1 mg by mouth daily.      [name] gabapentin (NEURONTIN) 400 MG capsule Take 400 mg by mouth at bedtime.     . polyvinyl alcohol (LIQUIFILM TEARS) 1.4 % ophthalmic solution Place 2 drops into both eyes 3 (three) times daily as needed (for dry eyes).    . vancomycin (VANCOCIN) 50 mg/mL oral solution Take 5 mLs (250 mg total) by mouth every 6 (six) hours. (Patient taking differently: Take 250 mg by mouth every 6 (six) hours. Qid every 3 days) 240 mL 0  . carvedilol (COREG) 3.125 MG tablet Take 1 tablet (3.125 mg total) by mouth daily. 180 tablet 3  . nitroGLYCERIN (NITROSTAT) 0.4 MG SL tablet Place 1 tablet (0.4 mg total) under the tongue every 5 (five) minutes as needed for chest pain. 25 tablet 3  . warfarin (COUMADIN) 2 MG tablet Take 2 tablets (4 mg total) by mouth daily.     No current facility-administered medications for this visit.     OBJECTIVE: Vitals:   04/03/17 1129  BP: 126/73  Pulse: 62  Resp: 16  Temp: (!) 96.9 F (36.1 C)     Body mass index is 21.48 kg/m.    ECOG FS:0 - Asymptomatic  General: Well-developed, well-nourished, no acute distress. Eyes: Pink conjunctiva, anicteric sclera. Lungs: Clear to auscultation bilaterally. Heart: Regular rate and rhythm. No rubs, murmurs, or gallops. Abdomen: Soft, nontender, nondistended. No organomegaly noted, normoactive bowel sounds. Musculoskeletal: No edema, cyanosis, or clubbing. Neuro: Alert, answering all questions appropriately. Cranial nerves grossly intact. Skin: No rashes or petechiae noted. Psych: Normal affect.   LAB RESULTS:  Lab Results  Component Value Date   NA 136 12/28/2016   K 3.9 12/28/2016   CL 106 12/28/2016   CO2 23 12/28/2016   GLUCOSE 136 (H) 12/28/2016   BUN 10 12/28/2016   CREATININE  0.68 12/28/2016   CALCIUM 8.5 (L) 12/28/2016   PROT 6.7 12/27/2016   ALBUMIN 3.9 12/27/2016   AST 57 (H) 12/27/2016   ALT 37 12/27/2016   ALKPHOS 94 12/27/2016   BILITOT 1.3 (H) 12/27/2016   GFRNONAA >60 12/28/2016   GFRAA >60 12/28/2016    Lab Results  Component Value Date   WBC 7.5 04/03/2017   NEUTROABS 4.1 04/03/2017   HGB 17.9 (H) 04/03/2017   HCT 51.7 (H) 04/03/2017   MCV 92.6 04/03/2017   PLT 172 04/03/2017   Lab Results  Component Value Date   IRON 194 (H) 04/03/2017   TIBC 305 04/03/2017   IRONPCTSAT 64 (H) 04/03/2017   Lab Results  Component Value Date   FERRITIN 184 04/03/2017  STUDIES: US Abdomen Limited Ruq  Result Date: 03/31/2017 CLINICAL DATA:  Patient with elevated hepatic enzymes. Known pancreatic mass. EXAM: US ABDOMEN LIMITED - RIGHT UPPER QUADRANT COMPARISON:  CT abdomen pelvis 12/27/2016 FINDINGS: Gallbladder: Surgically absent Common bile duct: Diameter: 3 mm Liver: Normal in size and contour.  No focal lesion identified. Re- demonstrated 2 cm hypoechoic mass involving the pancreatic head, previously evaluated on prior CT. IMPRESSION: Normal diameter common bile duct. Re- demonstrated cystic lesion within the pancreatic head, better demonstrated on prior CT. MRI for further evaluation was recommended on prior abdomen CT. Electronically Signed   By: Lovey Newcomer M.D.   On: 03/31/2017 13:24    ASSESSMENT: Hereditary hemochromatosis, CLL.  PLAN:    1. Hereditary hemochromatosis: Patient has not had phlebotomy and at least 3-4 years. Her hemoglobin is elevated, but stable. Iron stores are mildly elevated. Patient does not wish to pursue phlebotomy today, but will likely require it in the future. Return to clinic in 6 months for repeat laboratory work and further evaluation. 2. CLL: By report, diagnosed by bone marrow biopsy. Patient's white blood cell count continues to be within normal limits and she does not have a lymphocytosis. Peripheral blood flow  cytometry confirmed the diagnosis. No intervention is needed at this time. Return to clinic as above.  Patient expressed understanding and was in agreement with this plan. She also understands that She can call clinic at any time with any questions, concerns, or complaints.    Lloyd Huger, MD   04/09/2017 7:50 AM

## 2017-04-05 NOTE — Progress Notes (Signed)
Cardiology Office Note  Date:  04/07/2017   ID:  Joyce, Snyder 1927-11-04, MRN [id number]  PCP:  Adin Hector, MD   Chief Complaint  Patient presents with  . OTHER    F/u myoview no complaints today. Meds reviewed verbally with pt.    HPI:   [name] is a 83 y.o. female with history of  permanent atrial fibrillation on warfarin  s/p AV node ablation an pacemaker placement,   hypertension,  hyperlipidemia,  recurrent C. difficile colitis,  non-hodgkin's lymphoma,  hereditary hemochromatosis, CLL pancreatic mass,  Who presented to the hospital 12/27/2016 with non-STEMI, Troponin up to 19 managed medically at the request of the patient , patient declined catheterization at the time Echocardiogram showed ejection fraction 50-55%, inferolateral wall hypokinesis who presents for follow-up in the office for her coronary artery disease, previous non-STEMI  In follow-up today she reports that she is doing well She presents today to discuss results of her stress test performed on 01/11/2017 Pharmacological myocardial perfusion imaging study with no significant  Ischemia Moderate sized region of fixed perfusion defect basal to mid lateral wall Lateral wall hypokinesis, EF estimated at 48%  No complaints, "once in a while" little ache in her chest, does not last Dizzy all the time, "my sinus", "menieres"  Weight down 6 pounds in 3 months "bert stopped my lipitor", having a liver problem Total cholesterol 130s before Lipitor was stopped, down from 240 Review of lab work shows AST increased from 39 up to 75 ALT 37 up to 148 Recent lab work 03/31/2017 liver normalized  EKG personally reviewed by myself on todays visit Shows paced rhythm rate 55 bpm underlying atrial fibrillation  Other past medical history reviewed CT abdomen/pelvis showed slight interval enlargement of pancreatic mass but otherwise no acute abnormalities.   INR 6.4 (was given 3 mg in the hospital,  normally takes 2 mg) Underwent Mohs procedure at [facility] 3 days ago   took 3 nitroglycerin last night for right posterior neck and shoulder pain Did not know if it was chest pain or musculoskeletal  She has not started any exercise program or rehabilitation program Concerned that she may have more blockages, "Do I need a cardiac catheterization"   PMH:   has a past medical history of Atrial fibrillation ([place]) (07/20/2009); C. difficile colitis; Complete heart block  s/p av ablation; Coronary artery disease; Diverticulitis; GERD (gastroesophageal reflux disease); Hereditary hemochromatosis ([place]); Hyperlipidemia; Hypertension; Non Hodgkin's lymphoma ([place]); Osteoarthritis; Pacemaker syndrome (01/24/2014); and Pacemaker-dual-chamber-Medtronic (04/04/2011).  PSH:    Past Surgical History:  Procedure Laterality Date  . ABDOMINAL HYSTERECTOMY    . ATRIAL ABLATION SURGERY  2008   AV nodal ablation  . BREAST BIOPSY  benign  . CARDIAC CATHETERIZATION    . CARDIOVERSION    . CHOLECYSTECTOMY    . COLONOSCOPY  oct.12, 2012  . INSERT / REPLACE / REMOVE PACEMAKER    . PACEMAKER GENERATOR CHANGE N/A 12/17/2012   Procedure: PACEMAKER GENERATOR CHANGE;  Surgeon: Deboraha Sprang, MD;  Location: [facility] CATH LAB;  Service: Cardiovascular;  Laterality: N/A;  . stool transplant    . US ECHOCARDIOGRAPHY  07/2008   EF 55%, mild LVH. No significant valvular abnormalities    Current Outpatient Prescriptions  Medication Sig Dispense Refill  . ALPRAZolam (XANAX) 0.25 MG tablet Take 0.5 mg by mouth at bedtime as needed for sleep.    [name] aspirin EC 81 MG EC tablet Take 1 tablet (81 mg total) by mouth  daily.    . carvedilol (COREG) 3.125 MG tablet Take 1 tablet (3.125 mg total) by mouth 2 (two) times daily with a meal. 180 tablet 3  . Cetirizine HCl (ZYRTEC ALLERGY PO) Take by mouth daily.     . fluticasone (FLONASE) 50 MCG/ACT nasal spray Place 2 sprays into the nose daily as needed for allergies.     . folic acid (FOLVITE)  1 MG tablet Take 1 mg by mouth daily.      [name] gabapentin (NEURONTIN) 400 MG capsule Take 400 mg by mouth at bedtime.     . nitroGLYCERIN (NITROSTAT) 0.4 MG SL tablet Place 1 tablet (0.4 mg total) under the tongue every 5 (five) minutes as needed for chest pain. 25 tablet 3  . polyvinyl alcohol (LIQUIFILM TEARS) 1.4 % ophthalmic solution Place 2 drops into both eyes 3 (three) times daily as needed (for dry eyes).    . vancomycin (VANCOCIN) 50 mg/mL oral solution Take 5 mLs (250 mg total) by mouth every 6 (six) hours. (Patient taking differently: Take 250 mg by mouth every 6 (six) hours. Qid every 3 days) 240 mL 0  . warfarin (COUMADIN) 2 MG tablet Take 2 tablets (4 mg total) by mouth daily.     No current facility-administered medications for this visit.      Allergies:   Amoxicillin; Cefdinir; Cilostazol; Levofloxacin; Plavix [clopidogrel bisulfate]; and Sulfur   Social History:  The patient  reports that she quit smoking about 22 years ago. She has never used smokeless tobacco. She reports that she does not drink alcohol or use drugs.   Family History:   family history includes Cancer in her mother; Liver disease in her father.    Review of Systems: Review of Systems  Constitutional: Negative.   Respiratory: Negative.   Cardiovascular: Positive for chest pain.  Gastrointestinal: Negative.   Musculoskeletal: Negative.   Psychiatric/Behavioral: Negative.   All other systems reviewed and are negative.    PHYSICAL EXAM: VS:  BP (!) 108/58 (BP Location: Left Arm, Patient Position: Sitting, Cuff Size: Normal)   Pulse (!) 55   Ht 5\' 2"  (1.575 m)   Wt 117 lb (53.1 kg)   BMI 21.40 kg/m  , BMI Body mass index is 21.4 kg/m.  GEN: Well nourished, well developed, in no acute distress  HEENT: normal ,  bandage on right side of her nose  Neck: no JVD, carotid bruits, or masses Cardiac: RRR; no murmurs, rubs, or gallops,no edema  Respiratory:  clear to auscultation bilaterally, normal work  of breathing GI: soft, nontender, nondistended, + BS MS: no deformity or atrophy  Skin: warm and dry, no rash Neuro:  Strength and sensation are intact Psych: euthymic mood, full affect    Recent Labs: 08/29/2016: Magnesium 1.9; TSH 1.985 12/27/2016: ALT 37 12/28/2016: BUN 10; Creatinine, Ser 0.68; Potassium 3.9; Sodium 136 04/03/2017: Hemoglobin 17.9; Platelets 172    Lipid Panel Lab Results  Component Value Date   CHOL 155 12/29/2016   HDL 33 (L) 12/29/2016   LDLCALC 92 12/29/2016   TRIG 152 (H) 12/29/2016      Wt Readings from Last 3 Encounters:  04/07/17 117 lb (53.1 kg)  04/03/17 117 lb 7 oz (53.3 kg)  03/14/17 118 lb 12 oz (53.9 kg)       ASSESSMENT AND PLAN:  Coronary artery disease involving native coronary artery of native heart with unstable angina pectoris ([place]) - Plan: EKG 12-Lead occlusion of OM branch or distal RCA to cause inferolateral  wall hypokinesis No other ischemia seen on stress test March 2018 Denies having significant anginal symptoms  NSTEMI (non-ST elevated myocardial infarction) ([place]) - Plan: EKG 12-Lead  vague chest discomfort, likely musculoskeletal in nature As above, no further workup  Complete atrioventricular block ([place]) - Plan: EKG 12-Lead She has pacemaker, followed by EP   Chronic atrial fibrillation ([place]) - Plan: EKG 12-Lead Tolerating anticoagulation, on warfarin  Pacemaker-dual-chamber-Medtronic - Plan: EKG 12-Lead Followed by Jolyn Nap  Long term current use of anticoagulant - Plan: EKG 12-Lead On warfarin   Total encounter time more than 25 minutes  Greater than 50% was spent in counseling and coordination of care with the patient   Disposition:   F/U  6 months   Orders Placed This Encounter  Procedures  . EKG 12-Lead     Signed, Esmond Plants, M.D., Ph.D. 04/07/2017  [place], [place]

## 2017-04-07 ENCOUNTER — Encounter: Payer: Self-pay | Admitting: Cardiovascular Disease

## 2017-04-07 ENCOUNTER — Telehealth: Payer: Self-pay | Admitting: Cardiovascular Disease

## 2017-04-07 ENCOUNTER — Ambulatory Visit (INDEPENDENT_AMBULATORY_CARE_PROVIDER_SITE_OTHER): Payer: Medicare Other | Admitting: Cardiovascular Disease

## 2017-04-07 VITALS — BP 108/58 | HR 55 | Ht 62.0 in | Wt 117.0 lb

## 2017-04-07 DIAGNOSIS — I10 Essential (primary) hypertension: Secondary | ICD-10-CM | POA: Diagnosis not present

## 2017-04-07 DIAGNOSIS — I2510 Atherosclerotic heart disease of native coronary artery without angina pectoris: Secondary | ICD-10-CM | POA: Diagnosis not present

## 2017-04-07 DIAGNOSIS — K8689 Other specified diseases of pancreas: Secondary | ICD-10-CM

## 2017-04-07 DIAGNOSIS — I25110 Atherosclerotic heart disease of native coronary artery with unstable angina pectoris: Secondary | ICD-10-CM | POA: Diagnosis not present

## 2017-04-07 DIAGNOSIS — I214 Non-ST elevation (NSTEMI) myocardial infarction: Secondary | ICD-10-CM

## 2017-04-07 DIAGNOSIS — Z7189 Other specified counseling: Secondary | ICD-10-CM

## 2017-04-07 DIAGNOSIS — K869 Disease of pancreas, unspecified: Secondary | ICD-10-CM

## 2017-04-07 MED ORDER — CARVEDILOL 3.125 MG PO TABS: 3.1250 mg | ORAL_TABLET | Freq: Two times a day (BID) | ORAL | 3 refills | Status: DC

## 2017-04-07 MED ORDER — CARVEDILOL 3.125 MG PO TABS: 3.1250 mg | ORAL_TABLET | Freq: Every day | ORAL | 3 refills | Status: AC

## 2017-04-07 NOTE — Telephone Encounter (Signed)
Pt calling stating we sent in her Coreg with the same dose she was on She thinks it was supposed to be lowered but would like a call back to make sure  Please advise

## 2017-04-07 NOTE — Patient Instructions (Addendum)
Medication Instructions:   Please decrease the coreg down 3.125 mg twice a day  Labwork:  No new labs needed  Testing/Procedures:  No further testing at this time   I recommend watching educational videos on topics of interest to you at:       [email or web address]  Enter code: [email or web address]    Follow-Up: It was a pleasure seeing you in the office today. Please call us if you have new issues that need to be addressed before your next appt.  [phone]  Your physician wants you to follow-up in: 6 months.  You will receive a reminder letter in the mail two months in advance. If you don't receive a letter, please call our office to schedule the follow-up appointment.  If you need a refill on your cardiac medications before your next appointment, please call your pharmacy.

## 2017-04-07 NOTE — Telephone Encounter (Signed)
Spoke w/ pt. She states that Coreg 6.25 mg BID was decreased to 3.125 mg BID at last ov w/ Dr. Caryl Comes.  Per Dr. Rockey Situ she should verify this and if so, decrease to once daily. Pt found the pill bottle and confirmed that it is 3.125 mg.  She will take once daily and call back w/ any further questions or concerns.

## 2017-04-12 ENCOUNTER — Ambulatory Visit (INDEPENDENT_AMBULATORY_CARE_PROVIDER_SITE_OTHER): Payer: Medicare Other

## 2017-04-12 DIAGNOSIS — Z5181 Encounter for therapeutic drug level monitoring: Secondary | ICD-10-CM | POA: Diagnosis not present

## 2017-04-12 DIAGNOSIS — I482 Chronic atrial fibrillation, unspecified: Secondary | ICD-10-CM

## 2017-04-12 LAB — POCT INR: INR: 1.9

## 2017-05-06 ENCOUNTER — Inpatient Hospital Stay
Admission: EM | Admit: 2017-05-06 | Discharge: 2017-05-10 | DRG: 247 | Disposition: A | Discharge status: Home Health | Payer: Medicare Other | Attending: Internal Medicine | Admitting: Internal Medicine

## 2017-05-06 ENCOUNTER — Emergency Department: Payer: Medicare Other

## 2017-05-06 DIAGNOSIS — I214 Non-ST elevation (NSTEMI) myocardial infarction: Secondary | ICD-10-CM | POA: Diagnosis not present

## 2017-05-06 DIAGNOSIS — R778 Other specified abnormalities of plasma proteins: Secondary | ICD-10-CM

## 2017-05-06 DIAGNOSIS — I482 Chronic atrial fibrillation, unspecified: Secondary | ICD-10-CM | POA: Diagnosis present

## 2017-05-06 DIAGNOSIS — Z7982 Long term (current) use of aspirin: Secondary | ICD-10-CM

## 2017-05-06 DIAGNOSIS — R079 Chest pain, unspecified: Secondary | ICD-10-CM

## 2017-05-06 DIAGNOSIS — J449 Chronic obstructive pulmonary disease, unspecified: Secondary | ICD-10-CM | POA: Diagnosis present

## 2017-05-06 DIAGNOSIS — I10 Essential (primary) hypertension: Secondary | ICD-10-CM | POA: Diagnosis present

## 2017-05-06 DIAGNOSIS — I2510 Atherosclerotic heart disease of native coronary artery without angina pectoris: Secondary | ICD-10-CM | POA: Diagnosis present

## 2017-05-06 DIAGNOSIS — I252 Old myocardial infarction: Secondary | ICD-10-CM

## 2017-05-06 DIAGNOSIS — E785 Hyperlipidemia, unspecified: Secondary | ICD-10-CM | POA: Diagnosis present

## 2017-05-06 DIAGNOSIS — Z950 Presence of cardiac pacemaker: Secondary | ICD-10-CM | POA: Diagnosis present

## 2017-05-06 DIAGNOSIS — F419 Anxiety disorder, unspecified: Secondary | ICD-10-CM | POA: Diagnosis present

## 2017-05-06 DIAGNOSIS — C8590 Non-Hodgkin lymphoma, unspecified, unspecified site: Secondary | ICD-10-CM | POA: Diagnosis present

## 2017-05-06 DIAGNOSIS — R58 Hemorrhage, not elsewhere classified: Secondary | ICD-10-CM | POA: Diagnosis not present

## 2017-05-06 DIAGNOSIS — C9110 Chronic lymphocytic leukemia of B-cell type not having achieved remission: Secondary | ICD-10-CM | POA: Diagnosis present

## 2017-05-06 DIAGNOSIS — R0789 Other chest pain: Secondary | ICD-10-CM | POA: Diagnosis not present

## 2017-05-06 DIAGNOSIS — Z87891 Personal history of nicotine dependence: Secondary | ICD-10-CM

## 2017-05-06 DIAGNOSIS — R7989 Other specified abnormal findings of blood chemistry: Secondary | ICD-10-CM

## 2017-05-06 DIAGNOSIS — E875 Hyperkalemia: Secondary | ICD-10-CM | POA: Diagnosis not present

## 2017-05-06 DIAGNOSIS — Z90710 Acquired absence of both cervix and uterus: Secondary | ICD-10-CM

## 2017-05-06 DIAGNOSIS — K219 Gastro-esophageal reflux disease without esophagitis: Secondary | ICD-10-CM | POA: Diagnosis present

## 2017-05-06 DIAGNOSIS — Z7901 Long term (current) use of anticoagulants: Secondary | ICD-10-CM

## 2017-05-06 DIAGNOSIS — Z66 Do not resuscitate: Secondary | ICD-10-CM | POA: Diagnosis present

## 2017-05-06 HISTORY — DX: Permanent atrial fibrillation: I48.21

## 2017-05-06 LAB — CBC
HCT: 48.4 % — ABNORMAL HIGH (ref 35.0–47.0)
HEMOGLOBIN: 17.1 g/dL — AB (ref 12.0–16.0)
MCH: 33 pg (ref 26.0–34.0)
MCHC: 35.3 g/dL (ref 32.0–36.0)
MCV: 93.6 fL (ref 80.0–100.0)
PLATELETS: 166 10*3/uL (ref 150–440)
RBC: 5.17 MIL/uL (ref 3.80–5.20)
RDW: 13.2 % (ref 11.5–14.5)
WBC: 8.7 10*3/uL (ref 3.6–11.0)

## 2017-05-06 LAB — HEPATIC FUNCTION PANEL
ALBUMIN: 3.7 g/dL (ref 3.5–5.0)
ALK PHOS: 90 U/L (ref 38–126)
ALT: 63 U/L — AB (ref 14–54)
AST: 63 U/L — ABNORMAL HIGH (ref 15–41)
BILIRUBIN INDIRECT: 0.8 mg/dL (ref 0.3–0.9)
BILIRUBIN TOTAL: 0.9 mg/dL (ref 0.3–1.2)
Bilirubin, Direct: 0.1 mg/dL (ref 0.1–0.5)
TOTAL PROTEIN: 6.3 g/dL — AB (ref 6.5–8.1)

## 2017-05-06 LAB — BASIC METABOLIC PANEL
ANION GAP: 6 (ref 5–15)
BUN: 14 mg/dL (ref 6–20)
CHLORIDE: 103 mmol/L (ref 101–111)
CO2: 25 mmol/L (ref 22–32)
CREATININE: 0.78 mg/dL (ref 0.44–1.00)
Calcium: 9.9 mg/dL (ref 8.9–10.3)
GFR calc non Af Amer: >60 mL/min (ref >60)
Glucose, Bld: 116 mg/dL — ABNORMAL HIGH (ref 65–99)
POTASSIUM: 4.4 mmol/L (ref 3.5–5.1)
SODIUM: 134 mmol/L — AB (ref 135–145)

## 2017-05-06 LAB — TROPONIN I
TROPONIN I: 0.16 ng/mL — AB (ref <0.03)
Troponin I: 0.03 ng/mL (ref <0.03)

## 2017-05-06 LAB — DIFFERENTIAL
Basophils Absolute: 0.1 10*3/uL (ref 0–0.1)
Basophils Relative: 1 %
EOS PCT: 1 %
Eosinophils Absolute: 0.1 10*3/uL (ref 0–0.7)
LYMPHS PCT: 32 %
Lymphs Abs: 2.8 10*3/uL (ref 1.0–3.6)
MONO ABS: 1 10*3/uL — AB (ref 0.2–0.9)
Monocytes Relative: 12 %
Neutro Abs: 4.8 10*3/uL (ref 1.4–6.5)
Neutrophils Relative %: 54 %

## 2017-05-06 LAB — LIPID PANEL
CHOL/HDL RATIO: 6.5 ratio
Cholesterol: 203 mg/dL — ABNORMAL HIGH (ref 0–200)
HDL: 31 mg/dL — ABNORMAL LOW (ref >40)
LDL CALC: 138 mg/dL — AB (ref 0–99)
Triglycerides: 171 mg/dL — ABNORMAL HIGH (ref <150)
VLDL: 34 mg/dL (ref 0–40)

## 2017-05-06 LAB — BRAIN NATRIURETIC PEPTIDE: B Natriuretic Peptide: 277 pg/mL — ABNORMAL HIGH (ref 0.0–100.0)

## 2017-05-06 LAB — PROTIME-INR
INR: 2.18
Prothrombin Time: 24.6 seconds — ABNORMAL HIGH (ref 11.4–15.2)

## 2017-05-06 MED ORDER — NITROGLYCERIN 0.4 MG SL SUBL
0.4000 mg | SUBLINGUAL_TABLET | SUBLINGUAL | Status: DC | PRN
  Administered 2017-05-09: 0.4 mg via SUBLINGUAL

## 2017-05-06 MED ORDER — POLYVINYL ALCOHOL 1.4 % OP SOLN
2.0000 [drp] | Freq: Three times a day (TID) | OPHTHALMIC | Status: DC | PRN
  Filled 2017-05-06: qty 15

## 2017-05-06 MED ORDER — ALPRAZOLAM 0.5 MG PO TABS
0.5000 mg | ORAL_TABLET | Freq: Every evening | ORAL | Status: DC | PRN
  Administered 2017-05-06 – 2017-05-07 (×2): 0.5 mg via ORAL
  Administered 2017-05-08 – 2017-05-09 (×2): 0.25 mg via ORAL
  Filled 2017-05-06 (×4): qty 1

## 2017-05-06 MED ORDER — CARVEDILOL 3.125 MG PO TABS
3.1250 mg | ORAL_TABLET | Freq: Every day | ORAL | Status: DC
  Administered 2017-05-07 – 2017-05-10 (×3): 3.125 mg via ORAL
  Filled 2017-05-06 (×3): qty 1

## 2017-05-06 MED ORDER — FOLIC ACID 1 MG PO TABS
1.0000 mg | ORAL_TABLET | Freq: Every day | ORAL | Status: DC
  Administered 2017-05-07 – 2017-05-10 (×4): 1 mg via ORAL
  Filled 2017-05-06 (×4): qty 1

## 2017-05-06 MED ORDER — WARFARIN SODIUM 4 MG PO TABS
4.0000 mg | ORAL_TABLET | Freq: Every day | ORAL | Status: DC
  Administered 2017-05-06: 4 mg via ORAL
  Filled 2017-05-06 (×2): qty 1

## 2017-05-06 MED ORDER — POLYETHYL GLYCOL-PROPYL GLYCOL 0.4-0.3 % OP SOLN
2.0000 [drp] | Freq: Three times a day (TID) | OPHTHALMIC | Status: DC | PRN
  Administered 2017-05-06: 2 [drp] via OPHTHALMIC
  Filled 2017-05-06 (×2): qty 15

## 2017-05-06 MED ORDER — ASPIRIN EC 81 MG PO TBEC
81.0000 mg | DELAYED_RELEASE_TABLET | Freq: Every day | ORAL | Status: DC
  Administered 2017-05-07 – 2017-05-10 (×4): 81 mg via ORAL
  Filled 2017-05-06 (×4): qty 1

## 2017-05-06 MED ORDER — DOCUSATE SODIUM 100 MG PO CAPS: 100.0000 mg | ORAL_CAPSULE | Freq: Two times a day (BID) | ORAL | Status: DC | PRN

## 2017-05-06 MED ORDER — WARFARIN - PHARMACIST DOSING INPATIENT: Freq: Every day | Status: DC

## 2017-05-06 MED ORDER — GABAPENTIN 400 MG PO CAPS
400.0000 mg | ORAL_CAPSULE | Freq: Every day | ORAL | Status: DC
  Administered 2017-05-06 – 2017-05-09 (×4): 400 mg via ORAL
  Filled 2017-05-06 (×6): qty 1

## 2017-05-06 MED ORDER — FLUTICASONE PROPIONATE 50 MCG/ACT NA SUSP
2.0000 | Freq: Every day | NASAL | Status: DC
  Administered 2017-05-07 – 2017-05-10 (×4): 2 via NASAL
  Filled 2017-05-06: qty 16

## 2017-05-06 NOTE — Progress Notes (Signed)
Patient admitted to room 243 with he diagnosis of chest pain. Alert and oriented x 4 but HOH on the left ear. Patient denied any acute chest pain right now. Tele box reconciled to CCMD with [place]. K. As a second Psychologist, counselling. Skin assessment done with Marcene Brawn RN as well, no skin issues to report. Patient signed safety contract after explaination and also consented to give a password. Patient is resting at this time with her eyes closed, respirations even and unlabored. Will continue to monitor.

## 2017-05-06 NOTE — ED Triage Notes (Signed)
Pt arrives via ems, pt c/o chest pain today, ems reports that pt stated chest pain for 3 mos, then 3 days, then again just for 3 hours, ems questioned wether the patient was having any difficulty with her memory. Pt reported to have taken 3 nitro without relief and 1 baby asa. Ems reported to give 3 additional baby asa and 1 spray of nitro sl.

## 2017-05-06 NOTE — ED Provider Notes (Signed)
[facility] Emergency Department Provider Note  ____________________________________________   First MD Initiated Contact with Patient 05/06/17 1757     (approximate)  I have reviewed the triage vital signs and the nursing notes.   HISTORY  Chief Complaint Chest Pain    HPI [name] is a 83 y.o. female who comes to the emergency department via EMS with pressure-like left sided chest pain and shortness of breath. The pain began around 2 PM today several hours prior to arrival. The patient has a long-standing history of coronary artery disease and has a dual-chamber pacemaker. She was admitted to our hospital and several months ago with a non-ST elevation myocardial infarction. Her cardiologist Dr. Rockey Situ recommended cardiac catheterization at that time however she declined. She was treated medically and told to come back if her pain never recurred. She took 3 nitroglycerin at home but did not allow them to dissolve under her tongue swallowed but instead. Those nitroglycerin did not help however when EMS arrived they provided her with a spray of nitroglycerin which did improve her pain significantly. She is not currently in any pain. She's never had a DVT or pulmonary embolus.   Past Medical History:  Diagnosis Date  . Atrial fibrillation ([place]) 07/20/2009   Qualifier: Diagnosis of  By: Caryl Comes, MD, Leonidas Romberg Mack Guise   . C. difficile colitis    feces transplant  . Complete heart block  s/p av ablation    with atrial fibrillation. Pt is s/p AV nodal ablation due to permanent and symptomatic atrial fibrillation.   . Coronary artery disease    Nonobstructive  . Diverticulitis   . GERD (gastroesophageal reflux disease)   . Hereditary hemochromatosis ([place])    Phlebotomized in the past, not getting phlebotomy now  . Hyperlipidemia   . Hypertension   . Non Hodgkin's lymphoma ([place])    stable; followed by an oncologist  . Osteoarthritis   . Pacemaker  syndrome 01/24/2014  . Pacemaker-dual-chamber-Medtronic 04/04/2011    Patient Active Problem List   Diagnosis Date Noted  . Encounter for anticoagulation discussion and counseling   . Pancreatic mass   . Coronary artery disease involving native coronary artery of native heart with unstable angina pectoris ([place])   . Non-STEMI (non-ST elevated myocardial infarction) ([place]) 12/27/2016  . Acute URI 09/29/2016  . Hypotension 09/01/2016  . Bradycardia 09/01/2016  . Hyponatremia 09/01/2016  . Hypokalemia 09/01/2016  . Leukocytosis 09/01/2016  . Meniere disease 09/01/2016  . Dehydration 09/01/2016  . Coagulopathy ([place]) 09/01/2016  . Pancreatic cyst 09/01/2016  . Clostridium difficile colitis 08/28/2016  . Urge incontinence of urine 07/19/2016  . Urinary incontinence without sensory awareness 07/19/2016  . Vaginal atrophy 07/19/2016  . Rectocele 07/19/2016  . Status post hysterectomy 07/19/2016  . Arthritis 04/13/2016  . Gastroesophageal reflux disease with hiatal hernia 04/13/2016  . Mixed hyperlipidemia 04/13/2016  . Neuropathy, peripheral 04/13/2016  . Hereditary hemochromatosis ([place]) 11/26/2015  . Hyperglycemia, unspecified 11/26/2015  . Vitamin D deficiency 11/26/2015  . Osteoarthritis 09/08/2014  . Abnormal blood sugar 05/13/2014  . Contracture of palmar fascia (Dupuytren's) 04/15/2014  . Benign cystic mucinous tumor 04/15/2014  . Triggering of digit 04/15/2014  . Dupuytren's contracture of left hand 04/15/2014  . CLL (chronic lymphocytic leukemia) ([place]) 04/01/2014  . Calculous cholecystitis 02/05/2014  . Cervical pain 02/05/2014  . Chronic obstructive airway disease with asthma ([place]) 02/05/2014  . LBP (low back pain) 02/05/2014  . 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 Post cardiac operation functional disturbance  01/24/2014  . Encounter for therapeutic drug monitoring 11/27/2013  . Encounter for therapeutic drug level monitoring 11/27/2013  . Abdominal mass 06/20/2013  . Chest  pain 01/01/2013  . Central retinal edema, cystoid 12/24/2012  . Atrioventricular block, complete s/p AV ablation 12/11/2012  . Complete atrioventricular block ([place]) 12/11/2012  . H/O cataract extraction 10/08/2012  . Retinal telangiectasis 08/20/2012  . Pseudoaphakia 08/20/2012  . Pseudophakia of both eyes 08/20/2012  . PVC (premature ventricular contraction) 08/14/2012  . Hypertension 05/09/2012  . Essential hypertension 05/09/2012  . Sleep-disordered breathing 04/04/2011  . Pacemaker-dual-chamber-Medtronic 04/04/2011  . Artificial cardiac pacemaker 04/04/2011  . Long term (current) use of anticoagulants 02/09/2011  . Long term current use of anticoagulant 02/09/2011  . HYPERLIPIDEMIA-MIXED 12/09/2009  . Chronic atrial fibrillation ([place]) 07/20/2009    Past Surgical History:  Procedure Laterality Date  . ABDOMINAL HYSTERECTOMY    . ATRIAL ABLATION SURGERY  2008   AV nodal ablation  . BREAST BIOPSY  benign  . CARDIAC CATHETERIZATION    . CARDIOVERSION    . CHOLECYSTECTOMY    . COLONOSCOPY  oct.12, 2012  . INSERT / REPLACE / REMOVE PACEMAKER    . PACEMAKER GENERATOR CHANGE N/A 12/17/2012   Procedure: PACEMAKER GENERATOR CHANGE;  Surgeon: Deboraha Sprang, MD;  [facility] CATH LAB;  Service: Cardiovascular;  Laterality: N/A;  . stool transplant    . US ECHOCARDIOGRAPHY  07/2008   EF 55%, mild LVH. No significant valvular abnormalities    Prior to Admission medications   Medication Sig Start Date End Date Taking? Authorizing Provider  ALPRAZolam Duanne Moron) 0.25 MG tablet Take 0.5 mg by mouth at bedtime as needed for sleep.    [provider]  aspirin EC 81 MG EC tablet Take 1 tablet (81 mg total) by mouth daily. 12/31/16   Dustin Flock, MD  carvedilol (COREG) 3.125 MG tablet Take 1 tablet (3.125 mg total) by mouth daily. 04/07/17   Minna Merritts, MD  Cetirizine HCl (ZYRTEC ALLERGY PO) Take by mouth daily.     [provider]  fluticasone (FLONASE) 50 MCG/ACT  nasal spray Place 2 sprays into the nose daily as needed for allergies.     [provider]  folic acid (FOLVITE) 1 MG tablet Take 1 mg by mouth daily.      [provider]  gabapentin (NEURONTIN) 400 MG capsule Take 400 mg by mouth at bedtime.     [provider]  nitroGLYCERIN (NITROSTAT) 0.4 MG SL tablet Place 1 tablet (0.4 mg total) under the tongue every 5 (five) minutes as needed for chest pain. 01/01/17 04/07/17  Minna Merritts, MD  polyvinyl alcohol (LIQUIFILM TEARS) 1.4 % ophthalmic solution Place 2 drops into both eyes 3 (three) times daily as needed (for dry eyes).    [provider]  vancomycin (VANCOCIN) 50 mg/mL oral solution Take 5 mLs (250 mg total) by mouth every 6 (six) hours. Patient taking differently: Take 250 mg by mouth every 6 (six) hours. Qid every 3 days 09/01/16   Theodoro Grist, MD  warfarin (COUMADIN) 2 MG tablet Take 2 tablets (4 mg total) by mouth daily. 12/31/16 04/07/17  Dustin Flock, MD    Allergies Amoxicillin; Cefdinir; Cilostazol; Levofloxacin; Plavix [clopidogrel bisulfate]; and Sulfur  Family History  Problem Relation Age of Onset  . Liver disease Father   . Cancer Mother   . Prostate cancer Neg Hx   . Kidney disease Neg Hx   . Kidney cancer Neg Hx   .  Breast cancer Neg Hx   . Ovarian cancer Neg Hx   . Colon cancer Neg Hx     Social History Social History  Substance Use Topics  . Smoking status: Former Smoker    Quit date: 1996  . Smokeless tobacco: Never Used  . Alcohol use No    Review of Systems Constitutional: No fever/chills Eyes: No visual changes. ENT: No sore throat. Cardiovascular: Positive chest pain. Respiratory: Positive shortness of breath. Gastrointestinal: No abdominal pain.  No nausea, no vomiting.  No diarrhea.  No constipation. Genitourinary: Negative for dysuria. Musculoskeletal: Negative for back pain. Skin: Negative for rash. Neurological: Negative for headaches, focal weakness or  numbness.   ____________________________________________   PHYSICAL EXAM:  VITAL SIGNS: ED Triage Vitals  Enc Vitals Group     BP      Pulse      Resp      Temp      Temp src      SpO2      Weight      Height      Head Circumference      Peak Flow      Pain Score      Pain Loc      Pain Edu?      Excl. in [place]?     Constitutional: Alert and oriented 4 appears somewhat uncomfortable nontoxic no diaphoresis speaks in full clear sentences Eyes: PERRL EOMI. Head: Atraumatic. Nose: No congestion/rhinnorhea. Mouth/Throat: No trismus Neck: No stridor.   Cardiovascular: Normal rate, regular rhythm. Grossly normal heart sounds.  Good peripheral circulation. Respiratory: Normal respiratory effort.  No retractions. Lungs CTAB and moving good air Gastrointestinal: Soft nontender Musculoskeletal: No lower extremity edema   Neurologic:  Normal speech and language. No gross focal neurologic deficits are appreciated. Skin:  Skin is warm, dry and intact. No rash noted. Psychiatric: Mood and affect are normal. Speech and behavior are normal.    ____________________________________________   DIFFERENTIAL includes but not limited to  Acute coronary syndrome, pulmonary medicine, aortic dissection, pneumothorax, Boerhaave syndrome, musculoskeletal pain ____________________________________________   LABS (all labs ordered are listed, but only abnormal results are displayed)  Labs Reviewed  PROTIME-INR - Abnormal; Notable for the following:       Result Value   Prothrombin Time 24.6 (*)    All other components within normal limits  BASIC METABOLIC PANEL - Abnormal; Notable for the following:    Sodium 134 (*)    Glucose, Bld 116 (*)    All other components within normal limits  CBC - Abnormal; Notable for the following:    Hemoglobin 17.1 (*)    HCT 48.4 (*)    All other components within normal limits  DIFFERENTIAL - Abnormal; Notable for the following:    Monocytes  Absolute 1.0 (*)    All other components within normal limits  HEPATIC FUNCTION PANEL - Abnormal; Notable for the following:    Total Protein 6.3 (*)    AST 63 (*)    ALT 63 (*)    All other components within normal limits  TROPONIN I - Abnormal; Notable for the following:    Troponin I 0.03 (*)    All other components within normal limits  BRAIN NATRIURETIC PEPTIDE - Abnormal; Notable for the following:    B Natriuretic Peptide 277.0 (*)    All other components within normal limits  CBC WITH DIFFERENTIAL/PLATELET  Elevated troponin with normal renal function concerning for myocardial ischemia __________________________________________  EKG  ED ECG REPORT I, Darel Hong, the attending physician, personally viewed and interpreted this ECG.  Date: 05/06/2017 EKG Time: 1802 Rate: 61 Rhythm: Atrial sensed ventricular paced rhythm QRS Axis: normal Intervals: normal ST/T Wave abnormalities: normal Narrative Interpretation: Wavy baseline makes it difficult to interpret but no ischemic changes noted  ____________________________________________  RADIOLOGY  Chest x-ray with no acute disease but chronic changes ____________________________________________   PROCEDURES  Procedure(s) performed: no  Procedures  Critical Care performed: yes  CRITICAL CARE Performed by: Darel Hong   Total critical care time: 35 minutes  Critical care time was exclusive of separately billable procedures and treating other patients.  Critical care was necessary to treat or prevent imminent or life-threatening deterioration.  Critical care was time spent personally by me on the following activities: development of treatment plan with patient and/or surrogate as well as nursing, discussions with consultants, evaluation of patient's response to treatment, examination of patient, obtaining history from patient or surrogate, ordering and performing treatments and interventions, ordering and  review of laboratory studies, ordering and review of radiographic studies, pulse oximetry and re-evaluation of patient's condition.   Observation: no ____________________________________________   INITIAL IMPRESSION / ASSESSMENT AND PLAN / ED COURSE  Pertinent labs & imaging results that were available during my care of the patient were reviewed by me and considered in my medical decision making (see chart for details).  The patient arrives somewhat uncomfortable appearing with a concerning story for acute coronary syndrome. She has a long-standing history of coronary artery disease and pressure like chest pain alleviated by nitroglycerin is worrisome. Her EKG shows no acute dynamic changes. Regardless of her first troponin and believe she warrants inpatient admission for full rule out and risk stratification.  The patient's first troponin is very slightly elevated but I do not believe she warrants heparin at this time. We will continue with the admission for high risk chest pain.      ____________________________________________   FINAL CLINICAL IMPRESSION(S) / ED DIAGNOSES  Final diagnoses:  Chest pain, unspecified type      NEW MEDICATIONS STARTED DURING THIS VISIT:  New Prescriptions   No medications on file     Note:  This document was prepared using Dragon voice recognition software and may include unintentional dictation errors.     Darel Hong, MD 05/06/17 2024

## 2017-05-06 NOTE — H&P (Signed)
[facility] [place] NAME: [name]    MR#:  [id number]  DATE OF BIRTH:  Jun 03, 1928  DATE OF ADMISSION:  05/06/2017  PRIMARY CARE PHYSICIAN: Adin Hector, MD   REQUESTING/REFERRING PHYSICIAN: Rifenbark  CHIEF COMPLAINT:   Chief Complaint  Patient presents with  . Chest Pain    HISTORY OF PRESENT ILLNESS: [name]  is a 83 y.o. female with a known history of Atrial fibrillation, coronary artery disease, no stent was done in February 2018 but her stress test was negative for acute ischemia in March 2018, pacemaker placement, hyperlipidemia, hypertension, normal skin lymphoma- started having chest pain which was central and pressure-like this morning. So as advised by her cardiologist she took nitroglycerin tablets  total 3, and called EMS. EMS gave her 3 aspirin tablets. Her pain resolved after those and she was brought to emergency room. Because of her strong history of coronary artery disease and having typical chest pain, she is given for further monitoring in the hospital. While in the room and emergency, she walked to the commode in her room and that had started her chest pain again.  PAST MEDICAL HISTORY:   Past Medical History:  Diagnosis Date  . Atrial fibrillation ([place]) 07/20/2009   Qualifier: Diagnosis of  By: Caryl Comes, MD, Leonidas Romberg Mack Guise   . C. difficile colitis    feces transplant  . Complete heart block  s/p av ablation    with atrial fibrillation. Pt is s/p AV nodal ablation due to permanent and symptomatic atrial fibrillation.   . Coronary artery disease    Nonobstructive  . Diverticulitis   . GERD (gastroesophageal reflux disease)   . Hereditary hemochromatosis ([place])    Phlebotomized in the past, not getting phlebotomy now  . Hyperlipidemia   . Hypertension   . Non Hodgkin's lymphoma ([place])    stable; followed by an oncologist  . Osteoarthritis   . 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  Pacemaker-dual-chamber-Medtronic 04/04/2011    PAST SURGICAL HISTORY: Past Surgical History:  Procedure Laterality Date  . ABDOMINAL HYSTERECTOMY    . ATRIAL ABLATION SURGERY  2008   AV nodal ablation  . BREAST BIOPSY  benign  . CARDIAC CATHETERIZATION    . CARDIOVERSION    . CHOLECYSTECTOMY    . COLONOSCOPY  oct.12, 2012  . INSERT / REPLACE / REMOVE PACEMAKER    . PACEMAKER GENERATOR CHANGE N/A 12/17/2012   Procedure: PACEMAKER GENERATOR CHANGE;  Surgeon: Deboraha Sprang, MD;  Location: [facility] CATH LAB;  Service: Cardiovascular;  Laterality: N/A;  . stool transplant    . US ECHOCARDIOGRAPHY  07/2008   EF 55%, mild LVH. No significant valvular abnormalities    SOCIAL HISTORY:  Social History  Substance Use Topics  . Smoking status: Former Smoker    Quit date: 1996  . Smokeless tobacco: Never Used  . Alcohol use No    FAMILY HISTORY:  Family History  Problem Relation Age of Onset  . Liver disease Father   . Cancer Mother   . Prostate cancer Neg Hx   . Kidney disease Neg Hx   . Kidney cancer Neg Hx   . Breast cancer Neg Hx   . Ovarian cancer Neg Hx   . Colon cancer Neg Hx     DRUG ALLERGIES:  Allergies  Allergen Reactions  . Amoxicillin   . Cefdinir Diarrhea  . Cilostazol     Other reaction(s): Headache  . Levofloxacin   .  Plavix [Clopidogrel Bisulfate]     12/12/11 patient reports she has never taken Plavix before.   . Sulfur Nausea Only    REVIEW OF SYSTEMS:   CONSTITUTIONAL: No fever, fatigue or weakness.  EYES: No blurred or double vision.  EARS, NOSE, AND THROAT: No tinnitus or ear pain.  RESPIRATORY: No cough, shortness of breath, wheezing or hemoptysis.  CARDIOVASCULAR: Positive for chest pain, no orthopnea, edema.  GASTROINTESTINAL: No nausea, vomiting, diarrhea or abdominal pain.  GENITOURINARY: No dysuria, hematuria.  ENDOCRINE: No polyuria, nocturia,  HEMATOLOGY: No anemia, easy bruising or bleeding SKIN: No rash or lesion. MUSCULOSKELETAL: No joint  pain or arthritis.   NEUROLOGIC: No tingling, numbness, weakness.  PSYCHIATRY: No anxiety or depression.   MEDICATIONS AT HOME:  Prior to Admission medications   Medication Sig Start Date End Date Taking? Authorizing Provider  ALPRAZolam Duanne Moron) 0.25 MG tablet Take 0.5 mg by mouth at bedtime as needed for sleep.    [provider]  aspirin EC 81 MG EC tablet Take 1 tablet (81 mg total) by mouth daily. 12/31/16   Dustin Flock, MD  carvedilol (COREG) 3.125 MG tablet Take 1 tablet (3.125 mg total) by mouth daily. 04/07/17   Minna Merritts, MD  Cetirizine HCl (ZYRTEC ALLERGY PO) Take by mouth daily.     [provider]  fluticasone (FLONASE) 50 MCG/ACT nasal spray Place 2 sprays into the nose daily as needed for allergies.     [provider]  folic acid (FOLVITE) 1 MG tablet Take 1 mg by mouth daily.      [provider]  gabapentin (NEURONTIN) 400 MG capsule Take 400 mg by mouth at bedtime.     [provider]  nitroGLYCERIN (NITROSTAT) 0.4 MG SL tablet Place 1 tablet (0.4 mg total) under the tongue every 5 (five) minutes as needed for chest pain. 01/01/17 04/07/17  Minna Merritts, MD  polyvinyl alcohol (LIQUIFILM TEARS) 1.4 % ophthalmic solution Place 2 drops into both eyes 3 (three) times daily as needed (for dry eyes).    [provider]  vancomycin (VANCOCIN) 50 mg/mL oral solution Take 5 mLs (250 mg total) by mouth every 6 (six) hours. Patient taking differently: Take 250 mg by mouth every 6 (six) hours. Qid every 3 days 09/01/16   Theodoro Grist, MD  warfarin (COUMADIN) 2 MG tablet Take 2 tablets (4 mg total) by mouth daily. 12/31/16 04/07/17  Dustin Flock, MD      PHYSICAL EXAMINATION:   VITAL SIGNS: Blood pressure (!) 142/76, pulse 60, temperature 98.8 F (37.1 C), temperature source Oral, resp. rate 18, height 5\' 2"  (1.575 m), weight 52.6 kg (116 lb), SpO2 95 %.  GENERAL:  83 y.o.-year-old patient lying in the bed with no acute  distress.  EYES: Pupils equal, round, reactive to light and accommodation. No scleral icterus. Extraocular muscles intact.  HEENT: Head atraumatic, normocephalic. Oropharynx and nasopharynx clear.  NECK:  Supple, no jugular venous distention. No thyroid enlargement, no tenderness.  LUNGS: Normal breath sounds bilaterally, no wheezing, rales,rhonchi or crepitation. No use of accessory muscles of respiration.  CARDIOVASCULAR: S1, S2 normal. No murmurs, rubs, or gallops.  ABDOMEN: Soft, nontender, nondistended. Bowel sounds present. No organomegaly or mass.  EXTREMITIES: No pedal edema, cyanosis, or clubbing.  NEUROLOGIC: Cranial nerves II through XII are intact. Muscle strength 5/5 in all extremities. Sensation intact. Gait not checked.  PSYCHIATRIC: The patient is alert and oriented x 3.  SKIN: No obvious rash, lesion, or ulcer.  LABORATORY PANEL:   CBC  Recent Labs Lab 05/06/17 1910  WBC 8.7  HGB 17.1*  HCT 48.4*  PLT 166  MCV 93.6  MCH 33.0  MCHC 35.3  RDW 13.2  LYMPHSABS 2.8  MONOABS 1.0*  EOSABS 0.1  BASOSABS 0.1   ------------------------------------------------------------------------------------------------------------------  Chemistries   Recent Labs Lab 05/06/17 1910  NA 134*  K 4.4  CL 103  CO2 25  GLUCOSE 116*  BUN 14  CREATININE 0.78  CALCIUM 9.9  AST 63*  ALT 63*  ALKPHOS 90  BILITOT 0.9   ------------------------------------------------------------------------------------------------------------------ estimated creatinine clearance is 39.2 mL/min (by C-G formula based on SCr of 0.78 mg/dL). ------------------------------------------------------------------------------------------------------------------ No results for input(s): TSH, T4TOTAL, T3FREE, THYROIDAB in the last 72 hours.  Invalid input(s): FREET3   Coagulation profile  Recent Labs Lab 05/06/17 1804  INR 2.18    ------------------------------------------------------------------------------------------------------------------- No results for input(s): DDIMER in the last 72 hours. -------------------------------------------------------------------------------------------------------------------  Cardiac Enzymes  Recent Labs Lab 05/06/17 1910  TROPONINI 0.03*   ------------------------------------------------------------------------------------------------------------------ Invalid input(s): POCBNP  ---------------------------------------------------------------------------------------------------------------  Urinalysis    Component Value Date/Time   COLORURINE YELLOW (A) 12/27/2016 1422   APPEARANCEUR CLEAR (A) 12/27/2016 1422   APPEARANCEUR Clear 03/31/2016 1013   LABSPEC 1.011 12/27/2016 1422   LABSPEC 1.004 06/07/2014 1700   PHURINE 7.0 12/27/2016 1422   GLUCOSEU NEGATIVE 12/27/2016 1422   GLUCOSEU Negative 06/07/2014 1700   HGBUR NEGATIVE 12/27/2016 1422   BILIRUBINUR NEGATIVE 12/27/2016 1422   BILIRUBINUR Negative 03/31/2016 1013   BILIRUBINUR Negative 06/07/2014 1700   KETONESUR NEGATIVE 12/27/2016 1422   PROTEINUR NEGATIVE 12/27/2016 1422   NITRITE NEGATIVE 12/27/2016 1422   LEUKOCYTESUR NEGATIVE 12/27/2016 1422   LEUKOCYTESUR Negative 03/31/2016 1013   LEUKOCYTESUR Negative 06/07/2014 1700     RADIOLOGY: Dg Chest 1 View  Result Date: 05/06/2017 CLINICAL DATA:  Chest pain today. History of pacemaker, atrial fibrillation, non-Hodgkin's lymphoma. EXAM: CHEST 1 VIEW COMPARISON:  Chest radiograph December 27, 2016 FINDINGS: The cardiac silhouette is mild to moderately enlarged, unchanged. Mildly calcified aortic knob. Mild chronic interstitial changes without pleural effusion or focal consolidation. Mild hyperinflation. No pneumothorax. Dual lead RIGHT cardiac pacemaker in situ. Osteopenia. Soft tissue planes are nonsuspicious. IMPRESSION: Stable cardiomegaly and COPD.  Electronically Signed   By: Elon Alas M.D.   On: 05/06/2017 18:26    EKG: Orders placed or performed during the hospital encounter of 05/06/17  . ED EKG  . ED EKG  . EKG 12-Lead  . EKG 12-Lead    IMPRESSION AND PLAN:  * Chest pain   Monitor on telemetry, follow serial troponin.   She has history of coronary artery disease, continue aspirin, beta blockers.   Check hemoglobin A1c and lipid panel.   Cardiology consult.  * Hypertension   Continue beta blockers.  * Atrial fibrillation   Rate is under control, continue beta blocker, continue Coumadin. INR is therapeutic.  * Anxiety   Continue alprazolam as needed.     All the records are reviewed and case discussed with ED provider. Management plans discussed with the patient, family and they are in agreement.  CODE STATUS: DO NOT RESUSCITATE. Code Status History    Date Active Date Inactive Code Status Order ID Comments User Context   12/27/2016  6:12 PM 12/31/2016  6:15 PM DNR [id number]  Epifanio Lesches, MD ED   08/28/2016  9:36 PM 09/01/2016  4:56 PM DNR [id number]  Loletha Grayer, MD ED    Questions for Most Recent Historical Code Status (Order [id number])  Question Answer Comment   In the event of cardiac or respiratory ARREST Do not call a "code blue"    In the event of cardiac or respiratory ARREST Do not perform Intubation, CPR, defibrillation or ACLS    In the event of cardiac or respiratory ARREST Use medication by any route, position, wound care, and other measures to relive pain and suffering. May use oxygen, suction and manual treatment of airway obstruction as needed for comfort.         Advance Directive Documentation     Most Recent Value  Type of Advance Directive  Out of facility DNR (pink MOST or yellow form)  Pre-existing out of facility DNR order (yellow form or pink MOST form)  -  "MOST" Form in Place?  -     Her son and daughter present in the room during my visit.  TOTAL TIME TAKING  CARE OF THIS PATIENT: 50 minutes.    Vaughan Basta M.D on 05/06/2017   Between 7am to 6pm - Pager - [phone]  After 6pm go to [email or web address] - password [email or web address] [facility]  Office  [phone]  CC: Primary care physician; Adin Hector, MD   Note: This dictation was prepared with Dragon dictation along with smaller phrase technology. Any transcriptional errors that result from this process are unintentional.

## 2017-05-06 NOTE — ED Notes (Signed)
Patient states she started having chest pain this morning, she thought she was hungry, she ate and it didn't go away. Patient has a cardiac history of ablation and pacemaker.

## 2017-05-06 NOTE — ED Notes (Signed)
Patient given water to drink, applesauce and graham crackers per request. Family at bedside to assist.

## 2017-05-06 NOTE — ED Notes (Signed)
Unable to give report to the floor at this time due to nurse not being available.

## 2017-05-06 NOTE — ED Notes (Signed)
Dr. Mable Paris informed of patient's troponin of 0.03

## 2017-05-06 NOTE — Progress Notes (Signed)
Family Meeting Note  Advance Directive:yes  Today a meeting took place with the Patient and son and daughter.  The following clinical team members were present during this meeting:MD  The following were discussed:Patient's diagnosis: CAD, Chest pain, Htn , Patient's progosis: Unable to determine and Goals for treatment: DNR  Additional follow-up to be provided: Cardiology  Time spent during discussion:20 minutes  Breylen Agyeman, Rosalio Macadamia, MD

## 2017-05-06 NOTE — Progress Notes (Signed)
   05/06/17 2300  Clinical Encounter Type  Visited With Patient;Family;Patient and family together  Visit Type Initial;Spiritual support  Referral From Patient;Nurse  Spiritual Encounters  Spiritual Needs Emotional;Prayer  Family requested prayer.  [place] attended prayer and offered blessing for new born; emotional support offered.  [place] available as needed

## 2017-05-07 ENCOUNTER — Encounter: Payer: Self-pay | Admitting: Physician Assistant

## 2017-05-07 DIAGNOSIS — I2510 Atherosclerotic heart disease of native coronary artery without angina pectoris: Secondary | ICD-10-CM | POA: Diagnosis not present

## 2017-05-07 DIAGNOSIS — Z7982 Long term (current) use of aspirin: Secondary | ICD-10-CM | POA: Diagnosis not present

## 2017-05-07 DIAGNOSIS — Z66 Do not resuscitate: Secondary | ICD-10-CM | POA: Diagnosis present

## 2017-05-07 DIAGNOSIS — Z7901 Long term (current) use of anticoagulants: Secondary | ICD-10-CM | POA: Diagnosis not present

## 2017-05-07 DIAGNOSIS — F419 Anxiety disorder, unspecified: Secondary | ICD-10-CM | POA: Diagnosis present

## 2017-05-07 DIAGNOSIS — I252 Old myocardial infarction: Secondary | ICD-10-CM | POA: Diagnosis not present

## 2017-05-07 DIAGNOSIS — Z87891 Personal history of nicotine dependence: Secondary | ICD-10-CM | POA: Diagnosis not present

## 2017-05-07 DIAGNOSIS — C8590 Non-Hodgkin lymphoma, unspecified, unspecified site: Secondary | ICD-10-CM | POA: Diagnosis present

## 2017-05-07 DIAGNOSIS — R0789 Other chest pain: Secondary | ICD-10-CM | POA: Diagnosis present

## 2017-05-07 DIAGNOSIS — E875 Hyperkalemia: Secondary | ICD-10-CM | POA: Diagnosis not present

## 2017-05-07 DIAGNOSIS — I482 Chronic atrial fibrillation: Secondary | ICD-10-CM | POA: Diagnosis present

## 2017-05-07 DIAGNOSIS — Z955 Presence of coronary angioplasty implant and graft: Secondary | ICD-10-CM | POA: Diagnosis not present

## 2017-05-07 DIAGNOSIS — I214 Non-ST elevation (NSTEMI) myocardial infarction: Principal | ICD-10-CM

## 2017-05-07 DIAGNOSIS — I10 Essential (primary) hypertension: Secondary | ICD-10-CM

## 2017-05-07 DIAGNOSIS — R58 Hemorrhage, not elsewhere classified: Secondary | ICD-10-CM | POA: Diagnosis not present

## 2017-05-07 DIAGNOSIS — K219 Gastro-esophageal reflux disease without esophagitis: Secondary | ICD-10-CM | POA: Diagnosis present

## 2017-05-07 DIAGNOSIS — Z950 Presence of cardiac pacemaker: Secondary | ICD-10-CM | POA: Diagnosis not present

## 2017-05-07 DIAGNOSIS — E785 Hyperlipidemia, unspecified: Secondary | ICD-10-CM | POA: Diagnosis present

## 2017-05-07 DIAGNOSIS — J449 Chronic obstructive pulmonary disease, unspecified: Secondary | ICD-10-CM | POA: Diagnosis present

## 2017-05-07 DIAGNOSIS — Z90710 Acquired absence of both cervix and uterus: Secondary | ICD-10-CM | POA: Diagnosis not present

## 2017-05-07 DIAGNOSIS — C9110 Chronic lymphocytic leukemia of B-cell type not having achieved remission: Secondary | ICD-10-CM | POA: Diagnosis present

## 2017-05-07 LAB — CBC
HEMATOCRIT: 48.1 % — AB (ref 35.0–47.0)
Hemoglobin: 16.8 g/dL — ABNORMAL HIGH (ref 12.0–16.0)
MCH: 31.6 pg (ref 26.0–34.0)
MCHC: 34.9 g/dL (ref 32.0–36.0)
MCV: 90.6 fL (ref 80.0–100.0)
PLATELETS: 155 10*3/uL (ref 150–440)
RBC: 5.3 MIL/uL — ABNORMAL HIGH (ref 3.80–5.20)
RDW: 13.2 % (ref 11.5–14.5)
WBC: 6.4 10*3/uL (ref 3.6–11.0)

## 2017-05-07 LAB — BASIC METABOLIC PANEL
Anion gap: 6 (ref 5–15)
BUN: 10 mg/dL (ref 6–20)
CHLORIDE: 104 mmol/L (ref 101–111)
CO2: 26 mmol/L (ref 22–32)
CREATININE: 0.75 mg/dL (ref 0.44–1.00)
Calcium: 8.7 mg/dL — ABNORMAL LOW (ref 8.9–10.3)
GFR calc Af Amer: >60 mL/min (ref >60)
GFR calc non Af Amer: >60 mL/min (ref >60)
Glucose, Bld: 111 mg/dL — ABNORMAL HIGH (ref 65–99)
POTASSIUM: 4.1 mmol/L (ref 3.5–5.1)
Sodium: 136 mmol/L (ref 135–145)

## 2017-05-07 LAB — APTT: aPTT: 39 seconds — ABNORMAL HIGH (ref 24–36)

## 2017-05-07 LAB — TROPONIN I
TROPONIN I: 0.97 ng/mL — AB (ref <0.03)
Troponin I: 0.68 ng/mL (ref <0.03)

## 2017-05-07 LAB — MAGNESIUM: Magnesium: 2.2 mg/dL (ref 1.7–2.4)

## 2017-05-07 LAB — HEPARIN LEVEL (UNFRACTIONATED)
HEPARIN UNFRACTIONATED: 0.57 [IU]/mL (ref 0.30–0.70)
Heparin Unfractionated: 0.27 IU/mL — ABNORMAL LOW (ref 0.30–0.70)

## 2017-05-07 MED ORDER — HEPARIN BOLUS VIA INFUSION
800.0000 [IU] | Freq: Once | INTRAVENOUS | Status: AC
  Administered 2017-05-07: 800 [IU] via INTRAVENOUS
  Filled 2017-05-07: qty 800

## 2017-05-07 MED ORDER — HEPARIN BOLUS VIA INFUSION
4000.0000 [IU] | Freq: Once | INTRAVENOUS | Status: DC
  Filled 2017-05-07: qty 4000

## 2017-05-07 MED ORDER — HEPARIN (PORCINE) IN NACL 100-0.45 UNIT/ML-% IJ SOLN
700.0000 [IU]/h | INTRAMUSCULAR | Status: DC
  Administered 2017-05-07: 650 [IU]/h via INTRAVENOUS
  Filled 2017-05-07: qty 250

## 2017-05-07 MED ORDER — HEPARIN (PORCINE) IN NACL 100-0.45 UNIT/ML-% IJ SOLN: 900.0000 [IU]/h | INTRAMUSCULAR | Status: DC

## 2017-05-07 MED ORDER — POLYVINYL ALCOHOL 1.4 % OP SOLN
1.0000 [drp] | OPHTHALMIC | Status: DC | PRN
  Filled 2017-05-07: qty 15

## 2017-05-07 MED ORDER — SODIUM POLYSTYRENE SULFONATE 15 GM/60ML PO SUSP: 15.0000 g | Freq: Once | ORAL | Status: DC

## 2017-05-07 MED ORDER — ATORVASTATIN CALCIUM 20 MG PO TABS
40.0000 mg | ORAL_TABLET | Freq: Every day | ORAL | Status: DC
  Administered 2017-05-07 – 2017-05-08 (×2): 40 mg via ORAL
  Filled 2017-05-07 (×2): qty 2

## 2017-05-07 NOTE — Plan of Care (Signed)
Problem: Safety: Goal: Ability to remain free from injury will improve Outcome: Progressing Fall precautions in place,  Non skid socks when oob  Problem: Pain Managment: Goal: General experience of comfort will improve Outcome: Progressing Prn medications  Problem: Tissue Perfusion: Goal: Risk factors for ineffective tissue perfusion will decrease Outcome: Progressing Heparin gtt  Problem: Activity: Goal: Ability to tolerate increased activity will improve Outcome: Progressing No further complaints of chest pain  Problem: Cardiac: Goal: Ability to achieve and maintain adequate cardiovascular perfusion will improve Outcome: Progressing Continues to be on cardiac monitor, no arrhythmias, schedule for cath tomorrow

## 2017-05-07 NOTE — Progress Notes (Signed)
CRITICAL VALUE ALERT  Critical Value:  Troponin .97  Date & Time Notied: 05/07/17 0720  Provider Notified: Christell Faith PA  Orders Received/Actions taken: none

## 2017-05-07 NOTE — Progress Notes (Signed)
ANTICOAGULATION CONSULT NOTE - Initial Consult  Pharmacy Consult for heparin Indication: chest pain/ACS  Allergies  Allergen Reactions  . Amoxicillin   . Cefdinir Diarrhea  . Cilostazol     Other reaction(s): Headache  . Levofloxacin   . Plavix [Clopidogrel Bisulfate]     12/12/11 patient reports she has never taken Plavix before.   . Sulfur Nausea Only    Patient Measurements: Height: 5\' 2"  (157.5 cm) Weight: 114 lb 12.8 oz (52.1 kg) IBW/kg (Calculated) : 50.1 Heparin Dosing Weight: 52.1  Vital Signs: Temp: 97.8 F (36.6 C) (07/08 0324) Temp Source: Oral (07/08 0324) BP: 110/51 (07/08 0324) Pulse Rate: 50 (07/08 0324)  Labs:  Recent Labs  05/06/17 1804 05/06/17 1910 05/06/17 2258 05/07/17 0311  HGB  --  17.1*  --   --   HCT  --  48.4*  --   --   PLT  --  166  --   --   LABPROT 24.6*  --   --   --   INR 2.18  --   --   --   CREATININE  --  0.78  --   --   TROPONINI  --  0.03* 0.16* 0.68*    Estimated Creatinine Clearance: 39.2 mL/min (by C-G formula based on SCr of 0.78 mg/dL).   Medical History: Past Medical History:  Diagnosis Date  . Atrial fibrillation ([place]) 07/20/2009   Qualifier: Diagnosis of  By: Caryl Comes, MD, Leonidas Romberg Mack Guise   . C. difficile colitis    feces transplant  . Complete heart block  s/p av ablation    with atrial fibrillation. Pt is s/p AV nodal ablation due to permanent and symptomatic atrial fibrillation.   . Coronary artery disease    Nonobstructive  . Diverticulitis   . GERD (gastroesophageal reflux disease)   . Hereditary hemochromatosis ([place])    Phlebotomized in the past, not getting phlebotomy now  . Hyperlipidemia   . Hypertension   . Non Hodgkin's lymphoma ([place])    stable; followed by an oncologist  . Osteoarthritis   . 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 Pacemaker-dual-chamber-Medtronic 04/04/2011    Medications:  Scheduled:  . aspirin EC  81 mg Oral Daily  . atorvastatin  40 mg Oral q1800  . carvedilol  3.125 mg  Oral Daily  . fluticasone  2 spray Each Nare Daily  . folic acid  1 mg Oral Daily  . gabapentin  400 mg Oral QHS    Assessment: Patient admitted for CP w/ trops up at 0.68. Patient has a h/o afib and is chronically anticoagulated w/ warfarin. Pt. Is being started on a heparin drip.  7/7 Current INR 2.18  Goal of Therapy:  Heparin level 0.3-0.7 units/ml Monitor platelets by anticoagulation protocol: Yes   Plan:  Will omit heparin bolus considering patient has a therapeutic INR. Will start heparin infusion @ 650 units/hr and will recheck HL @ 1300. Baseline labs ordered.  Tobie Lords, PharmD, BCPS Clinical Pharmacist 05/07/2017

## 2017-05-07 NOTE — Progress Notes (Signed)
ANTICOAGULATION CONSULT NOTE - follow up [place] for heparin Indication: chest pain/ACS  Allergies  Allergen Reactions  . Amoxicillin   . Cefdinir Diarrhea  . Cilostazol     Other reaction(s): Headache  . Levofloxacin   . Plavix [Clopidogrel Bisulfate]     12/12/11 patient reports she has never taken Plavix before.   . Sulfur Nausea Only    Patient Measurements: Height: 5\' 2"  (157.5 cm) Weight: 114 lb 12.8 oz (52.1 kg) IBW/kg (Calculated) : 50.1 Heparin Dosing Weight: 52.1  Vital Signs: Temp: 97.9 F (36.6 C) (07/08 0740) Temp Source: Oral (07/08 0740) BP: 120/61 (07/08 0740) Pulse Rate: 50 (07/08 0740)  Labs:  Recent Labs  05/06/17 1804  05/06/17 1910 05/06/17 2258 05/07/17 0311 05/07/17 0448 05/07/17 0646 05/07/17 1309  HGB  --   --  17.1*  --   --   --  16.8*  --   HCT  --   --  48.4*  --   --   --  48.1*  --   PLT  --   --  166  --   --   --  155  --   APTT  --   --   --   --   --  39*  --   --   LABPROT 24.6*  --   --   --   --   --   --   --   INR 2.18  --   --   --   --   --   --   --   HEPARINUNFRC  --   --   --   --   --   --   --  0.27*  CREATININE  --   --  0.78  --   --   --  0.75  --   TROPONINI  --   < > 0.03* 0.16* 0.68*  --  0.97*  --   < > = values in this interval not displayed.  Estimated Creatinine Clearance: 39.2 mL/min (by C-G formula based on SCr of 0.75 mg/dL).   Medical History: Past Medical History:  Diagnosis Date  . C. difficile colitis    feces transplant  . Coronary artery disease    a. NSTEMI 12/2016 => med Rx; b. Nuc 12/2016: low risk; c. NSTEMI 04/2017  . Diverticulitis   . GERD (gastroesophageal reflux disease)   . Hereditary hemochromatosis ([place])    Phlebotomized in the past, not getting phlebotomy now  . Hyperlipidemia   . Hypertension   . Non Hodgkin's lymphoma ([place])    stable; followed by an oncologist  . Osteoarthritis   . 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 Pacemaker-dual-chamber-Medtronic  04/04/2011  . Permanent atrial fibrillation ([place]) 07/20/2009   a. s/p AV nodal ablation; b. s/p MDT PPM; c. CHADS2VASc => 5 (HTN, age x 2, vascular disease, female)    Medications:  Scheduled:  . aspirin EC  81 mg Oral Daily  . atorvastatin  40 mg Oral q1800  . carvedilol  3.125 mg Oral Daily  . fluticasone  2 spray Each Nare Daily  . folic acid  1 mg Oral Daily  . gabapentin  400 mg Oral QHS  . heparin  800 Units Intravenous Once    Assessment: Patient admitted for CP w/ trops up at 0.68 with non-Qwave MI. Patient has a h/o afib and is chronically anticoagulated w/ warfarin. Pt. Is being started on a heparin drip.  7/7 Current  INR 2.18  Goal of Therapy:  Heparin level 0.3-0.7 units/ml Monitor platelets by anticoagulation protocol: Yes   Plan:  Will omit heparin bolus considering patient has a therapeutic INR. Will start heparin infusion @ 650 units/hr and will recheck HL @ 1300. Baseline labs ordered.  7/8: HL@1309 = 0.27. Will give bolus of 800 units and increase drip to 750 units/hr. Will recheck HL at 2230.   [name] PharmD Clinical Pharmacist 05/07/2017

## 2017-05-07 NOTE — Progress Notes (Signed)
Called by RN for abnormal labs Patient has elevated troponin Level 0.68 Start patient on heparin drip and statin Continue aspirin and betablocker Cardiology follow up

## 2017-05-07 NOTE — Progress Notes (Addendum)
  Patient has been resting well this shift. Respirations even and unlabored. Will continue to monitor.

## 2017-05-07 NOTE — Consult Note (Signed)
Cardiology Consultation Note  Patient ID: [name], MRN: [id number], DOB/AGE: 12-21-1927 83 y.o. Admit date: 05/06/2017   Date of Consult: 05/07/2017 Primary Physician: Adin Hector, MD Primary Cardiologist: Dr. Rockey Situ, MD Requesting Physician: Dr. Anselm Jungling, MD  Chief Complaint: Chest pain Reason for Consult: Elevated troponin  HPI: [name] is a 83 y.o. female who is being seen today for the evaluation of elevated troponin at the request of Dr. Anselm Jungling, MD. Patient has a h/o CAD with NSTEMI in 12/2016 medically managed as the patient declined invasive evaluation, permanaent Afib on Coumadin s/p AV nodal ablation s/p MDT PPM, HTN, HLD, recurrent C diff colitis, non-Hodgkin's lymphoma, CLL, and pancreatic mass who presented to [facility] on 7/7 with chest pain.  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 Recurrent C diff colitis in 2017 managed with ABX. Prior admission in 12/2016 for NSTEMI. Declined invasive evaluation at that time. Echo showed preserved LV systolic function HK of the inferior and inferolateral myocardium, moderate MR, normal RV systolic function, PASP 33 mmHg. She underwent outpatient nuclear stress testing that was low risk.   She was in her usual state of health until the morning of 7/7. She reports waking up much later than she usually does followed by persistent fatigue in the morning causing her to lay back down. This was followed by the onset of substernal chest pain that radiated to her left shoulder and back She took one 81 mg ASA and continued on with her day. By the evening hours her pain continued to persist prompting her to contact EMS. Upon EMS arrival she was given a full ASA and brought to [facility]. No associated SOB, nausea, vomiting, diaphoresis, palpitations, dizziness, presyncope, or syncope. Pain felt exactly like her pain in 12/2016.  Upon the patient's arrival to [facility] they were found to have BP 142/76, HR 60 bpm, temp 98.8, oxygen saturation 95%  on room air, weight 116 pounds. EKG showed A sensed V paced rhythm, 61 bpm, CXR showed stable cardiomegaly with COPD. Labs showed 0.03-->0.16-->0.68-->0.97, BNP 277, SCr 0.78, K+ 4.4, WBC 8.7, HGB 17.1, PLT 166, LDL 138, AST 63, ALT 63, INR 2.18. In the ED she was given Xanax and started on a heparin infusion. She last took Coumadin on the night of 7/7. Currently, chest pain free.   Past Medical History:  Diagnosis Date  . C. difficile colitis    feces transplant  . Coronary artery disease    a. NSTEMI 12/2016 => med Rx; b. Nuc 12/2016: low risk; c. NSTEMI 04/2017  . Diverticulitis   . GERD (gastroesophageal reflux disease)   . Hereditary hemochromatosis ([place])    Phlebotomized in the past, not getting phlebotomy now  . Hyperlipidemia   . Hypertension   . Non Hodgkin's lymphoma ([place])    stable; followed by an oncologist  . Osteoarthritis   . 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 Pacemaker-dual-chamber-Medtronic 04/04/2011  . Permanent atrial fibrillation ([place]) 07/20/2009   a. s/p AV nodal ablation; b. s/p MDT PPM; c. CHADS2VASc => 5 (HTN, age x 2, vascular disease, female)      Most Recent Cardiac Studies: TTE 12/28/2016: Study Conclusions  - Left ventricle: The cavity size was normal. There was mild   concentric hypertrophy. Systolic function was normal. The   estimated ejection fraction was in the range of 50% to 55%.   Hypokinesis of the inferior myocardium. Hypokinesis of the   inferolateral myocardium. The study is not technically sufficient  to allow evaluation of LV diastolic function. Paced rhythm. - Mitral valve: Calcified annulus. There was moderate   regurgitation. - Left atrium: The atrium was mildly dilated. - Right ventricle: Systolic function was normal. - Pulmonary arteries: PA peak pressure: 33 mm Hg (S).  Nuclear stress test 01/11/2017: Study Result   Pharmacological myocardial perfusion imaging study with no significant  Ischemia Moderate sized region of fixed  perfusion defect basal to mid lateral wall GI uptake artifact noted Lateral wall hypokinesis, EF estimated at 48% No EKG changes concerning for ischemia at peak stress or in recovery.  Resting EKG with paced rhythm Low risk scan     Surgical History:  Past Surgical History:  Procedure Laterality Date  . ABDOMINAL HYSTERECTOMY    . ATRIAL ABLATION SURGERY  2008   AV nodal ablation  . BREAST BIOPSY  benign  . CARDIAC CATHETERIZATION    . CARDIOVERSION    . CHOLECYSTECTOMY    . COLONOSCOPY  oct.12, 2012  . INSERT / REPLACE / REMOVE PACEMAKER    . PACEMAKER GENERATOR CHANGE N/A 12/17/2012   Procedure: PACEMAKER GENERATOR CHANGE;  Surgeon: Deboraha Sprang, MD;  Location: [facility] CATH LAB;  Service: Cardiovascular;  Laterality: N/A;  . stool transplant    . US ECHOCARDIOGRAPHY  07/2008   EF 55%, mild LVH. No significant valvular abnormalities     Home Meds: Prior to Admission medications   Medication Sig Start Date End Date Taking? Authorizing Provider  ALPRAZolam Duanne Moron) 0.25 MG tablet Take 0.5 mg by mouth at bedtime as needed for sleep.    [provider]  aspirin EC 81 MG EC tablet Take 1 tablet (81 mg total) by mouth daily. 12/31/16   Dustin Flock, MD  carvedilol (COREG) 3.125 MG tablet Take 1 tablet (3.125 mg total) by mouth daily. 04/07/17   Minna Merritts, MD  Cetirizine HCl (ZYRTEC ALLERGY PO) Take by mouth daily.     [provider]  fluticasone (FLONASE) 50 MCG/ACT nasal spray Place 2 sprays into the nose daily as needed for allergies.     [provider]  folic acid (FOLVITE) 1 MG tablet Take 1 mg by mouth daily.      [provider]  gabapentin (NEURONTIN) 400 MG capsule Take 400 mg by mouth at bedtime.     [provider]  nitroGLYCERIN (NITROSTAT) 0.4 MG SL tablet Place 1 tablet (0.4 mg total) under the tongue every 5 (five) minutes as needed for chest pain. 01/01/17 04/07/17  Minna Merritts, MD  polyvinyl alcohol (LIQUIFILM TEARS)  1.4 % ophthalmic solution Place 2 drops into both eyes 3 (three) times daily as needed (for dry eyes).    [provider]  vancomycin (VANCOCIN) 50 mg/mL oral solution Take 5 mLs (250 mg total) by mouth every 6 (six) hours. Patient taking differently: Take 250 mg by mouth every 6 (six) hours. Qid every 3 days 09/01/16   Theodoro Grist, MD  warfarin (COUMADIN) 2 MG tablet Take 2 tablets (4 mg total) by mouth daily. 12/31/16 04/07/17  Dustin Flock, MD    Inpatient Medications:  . aspirin EC  81 mg Oral Daily  . atorvastatin  40 mg Oral q1800  . carvedilol  3.125 mg Oral Daily  . fluticasone  2 spray Each Nare Daily  . folic acid  1 mg Oral Daily  . gabapentin  400 mg Oral QHS   . heparin 650 Units/hr (05/07/17 0516)    Allergies:  Allergies  Allergen Reactions  .  Amoxicillin   . Cefdinir Diarrhea  . Cilostazol     Other reaction(s): Headache  . Levofloxacin   . Plavix [Clopidogrel Bisulfate]     12/12/11 patient reports she has never taken Plavix before.   . Sulfur Nausea Only    Social History   Social History  . Marital status: Widowed    Spouse name: N/A  . Number of children: N/A  . Years of education: N/A   Occupational History  . Retired Retired   Social History Main Topics  . Smoking status: Former Smoker    Quit date: 1996  . Smokeless tobacco: Never Used  . Alcohol use No  . Drug use: No  . Sexual activity: Yes    Birth control/ protection: Surgical   Other Topics Concern  . Not on file   Social History Narrative  . No narrative on file     Family History  Problem Relation Age of Onset  . Liver disease Father   . Cancer Mother   . Prostate cancer Neg Hx   . Kidney disease Neg Hx   . Kidney cancer Neg Hx   . Breast cancer Neg Hx   . Ovarian cancer Neg Hx   . Colon cancer Neg Hx      Review of Systems: Review of Systems  Constitutional: Positive for malaise/fatigue. Negative for chills, diaphoresis, fever and weight loss.  HENT:  Negative for congestion.   Eyes: Negative for discharge and redness.  Respiratory: Negative for cough, hemoptysis, sputum production, shortness of breath and wheezing.   Cardiovascular: Positive for chest pain. Negative for palpitations, orthopnea, claudication, leg swelling and PND.  Gastrointestinal: Negative for abdominal pain, blood in stool, heartburn, melena, nausea and vomiting.  Genitourinary: Negative for hematuria.  Musculoskeletal: Negative for falls and myalgias.  Skin: Negative for rash.  Neurological: Positive for weakness. Negative for dizziness, tingling, tremors, sensory change, speech change, focal weakness and loss of consciousness.  Endo/Heme/Allergies: Does not bruise/bleed easily.  Psychiatric/Behavioral: Negative for substance abuse. The patient is not nervous/anxious.   All other systems reviewed and are negative.   Labs:  Recent Labs  05/06/17 1910 05/06/17 2258 05/07/17 0311 05/07/17 0646  TROPONINI 0.03* 0.16* 0.68* 0.97*   Lab Results  Component Value Date   WBC 6.4 05/07/2017   HGB 16.8 (H) 05/07/2017   HCT 48.1 (H) 05/07/2017   MCV 90.6 05/07/2017   PLT 155 05/07/2017     Recent Labs Lab 05/06/17 1910 05/07/17 0646  NA 134* 136  K 4.4 4.1  CL 103 104  CO2 25 26  BUN 14 10  CREATININE 0.78 0.75  CALCIUM 9.9 8.7*  PROT 6.3*  --   BILITOT 0.9  --   ALKPHOS 90  --   ALT 63*  --   AST 63*  --   GLUCOSE 116* 111*   Lab Results  Component Value Date   CHOL 203 (H) 05/06/2017   HDL 31 (L) 05/06/2017   LDLCALC 138 (H) 05/06/2017   TRIG 171 (H) 05/06/2017   No results found for: DDIMER  Radiology/Studies:  Dg Chest 1 View  Result Date: 05/06/2017 IMPRESSION: Stable cardiomegaly and COPD. Electronically Signed   By: Elon Alas M.D.   On: 05/06/2017 18:26    EKG: Interpreted by me showed: A sensed V paced, 61 bpm Telemetry: Interpreted by me showed: V paced, 50s bpm  Weights: Filed Weights   05/06/17 1803 05/06/17 2222   Weight: 116 lb (52.6 kg) 114 lb 12.8  oz (52.1 kg)     Physical Exam: Blood pressure 120/61, pulse (!) 50, temperature 97.9 F (36.6 C), temperature source Oral, resp. rate 15, height 5\' 2"  (1.575 m), weight 114 lb 12.8 oz (52.1 kg), SpO2 93 %. Body mass index is 21 kg/m. General: Well developed, well nourished, in no acute distress. Head: Normocephalic, atraumatic, sclera non-icteric, no xanthomas, nares are without discharge.  Neck: Negative for carotid bruits. JVD not elevated. Lungs: Clear bilaterally to auscultation without wheezes, rales, or rhonchi. Breathing is unlabored. Heart: RRR with S1 S2. No murmurs, rubs, or gallops appreciated. Abdomen: Soft, non-tender, non-distended with normoactive bowel sounds. No hepatomegaly. No rebound/guarding. No obvious abdominal masses. Msk:  Strength and tone appear normal for age. Extremities: No clubbing or cyanosis. No edema. Distal pedal pulses are 2+ and equal bilaterally. Neuro: Alert and oriented X 3. No facial asymmetry. No focal deficit. Moves all extremities spontaneously. Psych:  Responds to questions appropriately with a normal affect.    Assessment and Plan:  Principal Problem:   Non-ST elevation (NSTEMI) myocardial infarction [facility]) Active Problems:   Chronic atrial fibrillation (HCC)   Pacemaker-dual-chamber-Medtronic   Hypertension   CLL (chronic lymphocytic leukemia) ([place])    1. NSTEMI: -Currently chest pain free -Troponin has trended to 0.97 to date, continue to cycle until peak -Heparin gtt -Hold Coumadin -She would like a cardiac cath with this NSTEMI (previously preferred no cath in 12/2016) -Will need to hold Coumadin to let INR come down, if INR is acceptable on Monday morning could proceed with LHC via radial arter. If INR remains too high would need to postpone LHC until 7/10 -Check TTE -A1c pending -LDL as above -Has already received full dose ASA by EMS -Continue ASA 81 mg, Coreg, and Lipitor -Risks and  benefits of cardiac catheterization have been discussed with the patient including risks of bleeding, bruising, infection, kidney damage, stroke, heart attack, and death. The patient understands these risks and is willing to proceed with the procedure. All questions have been answered and concerns listened to  2. Permanent Afib s/p AV nodal ablation: -A sensed V paced rhythm -On heparin gtt as above -Coreg -CHADS2VASc at least 5 (HTN, age x 2, vascular disease, female)  3. HTN: -Controlled -Continue current medications  4. HLD: -Lipitor   Signed, Christell Faith, PA-C [facility] Pager: [phone] 05/07/2017, 7:53 AM   I have personally seen and examined this patient with Christell Faith, PA-C I agree with the assessment and plan as outlined above. She is a pleasant 83 yo female with history of persistent atrial fib on coumadin, HTN, HLD, s/p AV nodal ablation with PPM placement, NH lymphoma, CLL and pancreatic mass with presumed CAD after admission in February 2018 with NSTEMI (decline invasive evaluation at that time but with low risk stress test) admitted with chest pain and found to have elevated troponin, now up to 0.97. She is now chest pain free.  Labs reviewed by me. Troponin 0.97. Creat 0.75. H/H normal EKG reviewed by me and shows ventricular paced rhythm Telemetry reviewed by me and shows ventricular paced rhythm, rate 60 bpm  My exam show:  General: Thin, elderly female in NAD.   HEENT: OP clear, mucus membranes moist  SKIN: warm, dry. No rashes. Neuro: No focal deficits  Musculoskeletal: Muscle strength 5/5 all ext  Psychiatric: Mood and affect normal  Neck: No JVD, no carotid bruits, no thyromegaly, no lymphadenopathy.  Lungs:Clear bilaterally, no wheezes, rhonci, crackles Cardiovascular: Regular rate and rhythm with soft  systolic murmur.  Abdomen:Soft. Bowel sounds present. Non-tender.  Extremities: No lower extremity edema. Pulses are 2 + in the bilateral  DP/PT.  Plan:  1. NSTEMI: Pt with risk factors for CAD including advanced age, HTN, HLD. She had a NSTEMI in February 2018 but declined invasive workup. Now agreeable to cardiac cath. Hold coumadin. Start heparin drip. Will plan echo. Plan cardiac cath when INR below 1.8.  2. HTN: BP controlled.   Lauree Chandler 05/07/2017 9:36 AM

## 2017-05-07 NOTE — Progress Notes (Addendum)
[place] [facility] [place] NAME: [name]    MR#:  [id number]  DATE OF BIRTH:  1928-03-21  SUBJECTIVE:  Came in with chest discomfort at home on and off. Patient ruled in for non-Q-wave MI borderline troponin. No cp today REVIEW OF SYSTEMS:   Review of Systems  Constitutional: Negative for chills, fever and weight loss.  HENT: Negative for ear discharge, ear pain and nosebleeds.   Eyes: Negative for blurred vision, pain and discharge.  Respiratory: Negative for sputum production, shortness of breath, wheezing and stridor.   Cardiovascular: Negative for chest pain, palpitations, orthopnea and PND.  Gastrointestinal: Negative for abdominal pain, diarrhea, nausea and vomiting.  Genitourinary: Negative for frequency and urgency.  Musculoskeletal: Negative for back pain and joint pain.  Neurological: Positive for weakness. Negative for sensory change, speech change and focal weakness.  Psychiatric/Behavioral: Negative for depression and hallucinations. The patient is not nervous/anxious.    Tolerating Diet:yes Tolerating PT: pending  DRUG ALLERGIES:   Allergies  Allergen Reactions  . Amoxicillin   . Cefdinir Diarrhea  . Cilostazol     Other reaction(s): Headache  . Levofloxacin   . Plavix [Clopidogrel Bisulfate]     12/12/11 patient reports she has never taken Plavix before.   . Sulfur Nausea Only    VITALS:  Blood pressure 120/61, pulse (!) 50, temperature 97.9 F (36.6 C), temperature source Oral, resp. rate 15, height 5\' 2"  (1.575 m), weight 52.1 kg (114 lb 12.8 oz), SpO2 93 %.  PHYSICAL EXAMINATION:   Physical Exam  GENERAL:  83 y.o.-year-old patient lying in the bed with no acute distress.  EYES: Pupils equal, round, reactive to light and accommodation. No scleral icterus. Extraocular muscles intact.  HEENT: Head atraumatic, normocephalic. Oropharynx and nasopharynx clear.  NECK:  Supple, no jugular venous distention. No  thyroid enlargement, no tenderness.  LUNGS: Normal breath sounds bilaterally, no wheezing, rales, rhonchi. No use of accessory muscles of respiration.  CARDIOVASCULAR: S1, S2 normal. No murmurs, rubs, or gallops.  ABDOMEN: Soft, nontender, nondistended. Bowel sounds present. No organomegaly or mass.  EXTREMITIES: No cyanosis, clubbing or edema b/l.    NEUROLOGIC: Cranial nerves II through XII are intact. No focal Motor or sensory deficits b/l.   PSYCHIATRIC:  patient is alert and oriented x 3.  SKIN: No obvious rash, lesion, or ulcer.   LABORATORY PANEL:  CBC  Recent Labs Lab 05/07/17 0646  WBC 6.4  HGB 16.8*  HCT 48.1*  PLT 155    Chemistries   Recent Labs Lab 05/06/17 1910 05/07/17 0646  NA 134* 136  K 4.4 4.1  CL 103 104  CO2 25 26  GLUCOSE 116* 111*  BUN 14 10  CREATININE 0.78 0.75  CALCIUM 9.9 8.7*  MG  --  2.2  AST 63*  --   ALT 63*  --   ALKPHOS 90  --   BILITOT 0.9  --    Cardiac Enzymes  Recent Labs Lab 05/07/17 0646  TROPONINI 0.97*   RADIOLOGY:  Dg Chest 1 View  Result Date: 05/06/2017 CLINICAL DATA:  Chest pain today. History of pacemaker, atrial fibrillation, non-Hodgkin's lymphoma. EXAM: CHEST 1 VIEW COMPARISON:  Chest radiograph December 27, 2016 FINDINGS: The cardiac silhouette is mild to moderately enlarged, unchanged. Mildly calcified aortic knob. Mild chronic interstitial changes without pleural effusion or focal consolidation. Mild hyperinflation. No pneumothorax. Dual lead RIGHT cardiac pacemaker in situ. Osteopenia. Soft tissue planes are nonsuspicious. IMPRESSION: Stable cardiomegaly and  COPD. Electronically Signed   By: Elon Alas M.D.   On: 05/06/2017 18:26   ASSESSMENT AND PLAN:   [name]  is a 83 y.o. female with a known history of Atrial fibrillation, coronary artery disease, no stent was done in February 2018 but her stress test was negative for acute ischemia in March 2018, pacemaker placement, hyperlipidemia, hypertension,  normal skin lymphoma- started having chest pain which was central and pressure-like this morning.   *Acute mild non-Q-wave MI -Patient presented with on and off chest pain -Troponin 0.03--- 0.16--- 0.68--- 0.97 - She has history of coronary artery disease, continue aspirin, beta blockers.  -Cardiology consult.----Appreciated. Patient is scheduled for cardiac cath once INR is down. INR 2.18--2.42--INR tomorrow -On IV heparin drip  * Hypertension   Continue beta blockers.  * Chronic Atrial fibrillation status post AV nodal ablation   Rate is under control, continue beta blocker, -hOld Coumadin pending cardiac catheter  * Anxiety   Continue alprazolam as needed.   Case discussed with Care Management/Social Worker. Management plans discussed with the patient, family and they are in agreement.  CODE STATUS: DO NOT RESUSCITATE  DVT Prophylaxis: IV heparin drip  TOTAL TIME TAKING CARE OF THIS PATIENT: 30 minutes.  >50% time spent on counselling and coordination of care  POSSIBLE D/C IN  1-2 DAYS, DEPENDING ON CLINICAL CONDITION.  Note: This dictation was prepared with Dragon dictation along with smaller phrase technology. Any transcriptional errors that result from this process are unintentional.  Alcide Memoli M.D on 05/07/2017 at 10:46 AM  Between 7am to 6pm - Pager - [phone]  After 6pm go to [email or web address] - password [email or web address] [facility]  Office  [phone]  CC: Primary care physician; Adin Hector, MD

## 2017-05-07 NOTE — Progress Notes (Signed)
Troponin level is now 0.68. Patient is chest pain free and resting at this time, respirations even and unlabored. DR. Estanislado Pandy notified of the Troponin level with a new order to hold the Coumadin and he indicated he was going to order Heparin drip to be infused. Will continue to monitor.

## 2017-05-07 NOTE — Progress Notes (Signed)
ANTICOAGULATION CONSULT NOTE - follow up [place] for heparin Indication: chest pain/ACS  Allergies  Allergen Reactions  . Amoxicillin   . Cefdinir Diarrhea  . Cilostazol     Other reaction(s): Headache  . Levofloxacin   . Plavix [Clopidogrel Bisulfate]     12/12/11 patient reports she has never taken Plavix before.   . Sulfur Nausea Only    Patient Measurements: Height: 5\' 2"  (157.5 cm) Weight: 114 lb 12.8 oz (52.1 kg) IBW/kg (Calculated) : 50.1 Heparin Dosing Weight: 52.1  Vital Signs: Temp: 97.9 F (36.6 C) (07/08 1953) Temp Source: Oral (07/08 1953) BP: 144/95 (07/08 1953) Pulse Rate: 61 (07/08 1953)  Labs:  Recent Labs  05/06/17 1804  05/06/17 1910 05/06/17 2258 05/07/17 0311 05/07/17 0448 05/07/17 0646 05/07/17 1309 05/07/17 2237  HGB  --   --  17.1*  --   --   --  16.8*  --   --   HCT  --   --  48.4*  --   --   --  48.1*  --   --   PLT  --   --  166  --   --   --  155  --   --   APTT  --   --   --   --   --  39*  --   --   --   LABPROT 24.6*  --   --   --   --   --   --   --   --   INR 2.18  --   --   --   --   --   --   --   --   HEPARINUNFRC  --   --   --   --   --   --   --  0.27* 0.57  CREATININE  --   --  0.78  --   --   --  0.75  --   --   TROPONINI  --   < > 0.03* 0.16* 0.68*  --  0.97*  --   --   < > = values in this interval not displayed.  Estimated Creatinine Clearance: 39.2 mL/min (by C-G formula based on SCr of 0.75 mg/dL).   Medical History: Past Medical History:  Diagnosis Date  . C. difficile colitis    feces transplant  . Coronary artery disease    a. NSTEMI 12/2016 => med Rx; b. Nuc 12/2016: low risk; c. NSTEMI 04/2017  . Diverticulitis   . GERD (gastroesophageal reflux disease)   . Hereditary hemochromatosis ([place])    Phlebotomized in the past, not getting phlebotomy now  . Hyperlipidemia   . Hypertension   . Non Hodgkin's lymphoma ([place])    stable; followed by an oncologist  . Osteoarthritis   . Pacemaker  syndrome 01/24/2014  . Pacemaker-dual-chamber-Medtronic 04/04/2011  . Permanent atrial fibrillation ([place]) 07/20/2009   a. s/p AV nodal ablation; b. s/p MDT PPM; c. CHADS2VASc => 5 (HTN, age x 2, vascular disease, female)    Medications:  Scheduled:  . aspirin EC  81 mg Oral Daily  . atorvastatin  40 mg Oral q1800  . carvedilol  3.125 mg Oral Daily  . fluticasone  2 spray Each Nare Daily  . folic acid  1 mg Oral Daily  . gabapentin  400 mg Oral QHS    Assessment: Patient admitted for CP w/ trops up at 0.68 with non-Qwave MI. Patient has a  h/o afib and is chronically anticoagulated w/ warfarin. Pt. Is being started on a heparin drip.  7/7 Current INR 2.18  Goal of Therapy:  Heparin level 0.3-0.7 units/ml Monitor platelets by anticoagulation protocol: Yes   Plan:  Will omit heparin bolus considering patient has a therapeutic INR. Will start heparin infusion @ 650 units/hr and will recheck HL @ 1300. Baseline labs ordered.  7/8: HL@1309 = 0.27. Will give bolus of 800 units and increase drip to 750 units/hr. Will recheck HL at 2230.  7/8 @ 2230 HL 0.57 therapeutic. Will continue current rate and will recheck HL @ 0700.  Tobie Lords, PharmD, BCPS Clinical Pharmacist 05/07/2017

## 2017-05-08 ENCOUNTER — Encounter
Admission: EM | Disposition: A | Discharge status: Home Health | Payer: Self-pay | Source: Home / Self Care | Attending: Internal Medicine

## 2017-05-08 LAB — PROTIME-INR
INR: 2.42
Prothrombin Time: 26.8 seconds — ABNORMAL HIGH (ref 11.4–15.2)

## 2017-05-08 LAB — BASIC METABOLIC PANEL
Anion gap: 7 (ref 5–15)
BUN: 17 mg/dL (ref 6–20)
CHLORIDE: 105 mmol/L (ref 101–111)
CO2: 24 mmol/L (ref 22–32)
CREATININE: 0.62 mg/dL (ref 0.44–1.00)
Calcium: 8.9 mg/dL (ref 8.9–10.3)
GFR calc Af Amer: >60 mL/min (ref >60)
GFR calc non Af Amer: >60 mL/min (ref >60)
GLUCOSE: 113 mg/dL — AB (ref 65–99)
POTASSIUM: 4 mmol/L (ref 3.5–5.1)
Sodium: 136 mmol/L (ref 135–145)

## 2017-05-08 LAB — CBC
HEMATOCRIT: 50.8 % — AB (ref 35.0–47.0)
Hemoglobin: 17.5 g/dL — ABNORMAL HIGH (ref 12.0–16.0)
MCH: 31.3 pg (ref 26.0–34.0)
MCHC: 34.4 g/dL (ref 32.0–36.0)
MCV: 90.8 fL (ref 80.0–100.0)
PLATELETS: 154 10*3/uL (ref 150–440)
RBC: 5.59 MIL/uL — ABNORMAL HIGH (ref 3.80–5.20)
RDW: 13.6 % (ref 11.5–14.5)
WBC: 6.7 10*3/uL (ref 3.6–11.0)

## 2017-05-08 LAB — HEPARIN LEVEL (UNFRACTIONATED)
Heparin Unfractionated: 0.77 IU/mL — ABNORMAL HIGH (ref 0.30–0.70)
Heparin Unfractionated: 0.58 IU/mL (ref 0.30–0.70)

## 2017-05-08 LAB — HEMOGLOBIN A1C
Hgb A1c MFr Bld: 6.3 % — ABNORMAL HIGH (ref 4.8–5.6)
Mean Plasma Glucose: 134 mg/dL

## 2017-05-08 SURGERY — LEFT HEART CATH AND CORONARY ANGIOGRAPHY
Anesthesia: Moderate Sedation

## 2017-05-08 MED ORDER — VANCOMYCIN 50 MG/ML ORAL SOLUTION
250.0000 mg | Freq: Four times a day (QID) | ORAL | Status: AC
  Administered 2017-05-08 (×4): 250 mg via ORAL
  Filled 2017-05-08 (×5): qty 5

## 2017-05-08 NOTE — Progress Notes (Signed)
    INR elevated at 2.4. Will cancel LHC and reassess in the morning.

## 2017-05-08 NOTE — Progress Notes (Signed)
Patient Name: [name] Date of Encounter: 05/08/2017  Primary Cardiologist: [facility] Problem List     Principal Problem:   Non-ST elevation (NSTEMI) myocardial infarction [facility]) Active Problems:   Chronic atrial fibrillation (HCC)   Pacemaker-dual-chamber-Medtronic   Hypertension   CLL (chronic lymphocytic leukemia) (HCC)   NSTEMI (non-ST elevated myocardial infarction) (HCC)     Subjective   No further chest pain. For LHC today if INR is < 1.8 (level pending this morning).   Inpatient Medications    Scheduled Meds: . aspirin EC  81 mg Oral Daily  . atorvastatin  40 mg Oral q1800  . carvedilol  3.125 mg Oral Daily  . fluticasone  2 spray Each Nare Daily  . folic acid  1 mg Oral Daily  . gabapentin  400 mg Oral QHS   Continuous Infusions: . heparin 750 Units/hr (05/07/17 1425)   PRN Meds: ALPRAZolam, docusate sodium, nitroGLYCERIN, polyvinyl alcohol   Vital Signs    Vitals:   05/07/17 0740 05/07/17 1512 05/07/17 1953 05/08/17 0607  BP: 120/61 131/60 (!) 144/95 114/60  Pulse: (!) 50 60 61 (!) 50  Resp: 15 14 18 16   Temp: 97.9 F (36.6 C) 97.9 F (36.6 C) 97.9 F (36.6 C) 98.3 F (36.8 C)  TempSrc: Oral  Oral   SpO2: 93% 91% 97% 93%  Weight:      Height:        Intake/Output Summary (Last 24 hours) at 05/08/17 0706 Last data filed at 05/08/17 0041  Gross per 24 hour  Intake           419.48 ml  Output             1450 ml  Net         -1030.52 ml   Filed Weights   05/06/17 1803 05/06/17 2222  Weight: 116 lb (52.6 kg) 114 lb 12.8 oz (52.1 kg)    Physical Exam    GEN: Well nourished, well developed, in no acute distress.  HEENT: Grossly normal.  Neck: Supple, no JVD, carotid bruits, or masses. Cardiac: RRR, no murmurs, rubs, or gallops. No clubbing, cyanosis, edema.  Radials/DP/PT 2+ and equal bilaterally.  Respiratory:  Respirations regular and unlabored, clear to auscultation bilaterally. GI: Soft, nontender, nondistended, BS + x  4. MS: no deformity or atrophy. Skin: warm and dry, no rash. Neuro:  Strength and sensation are intact. Psych: AAOx3.  Normal affect.  Labs    CBC  Recent Labs  05/06/17 1910 05/07/17 0646  WBC 8.7 6.4  NEUTROABS 4.8  --   HGB 17.1* 16.8*  HCT 48.4* 48.1*  MCV 93.6 90.6  PLT 166 465   Basic Metabolic Panel  Recent Labs  05/06/17 1910 05/07/17 0646  NA 134* 136  K 4.4 4.1  CL 103 104  CO2 25 26  GLUCOSE 116* 111*  BUN 14 10  CREATININE 0.78 0.75  CALCIUM 9.9 8.7*  MG  --  2.2   Liver Function Tests  Recent Labs  05/06/17 1910  AST 63*  ALT 63*  ALKPHOS 90  BILITOT 0.9  PROT 6.3*  ALBUMIN 3.7   No results for input(s): LIPASE, AMYLASE in the last 72 hours. Cardiac Enzymes  Recent Labs  05/06/17 2258 05/07/17 0311 05/07/17 0646  TROPONINI 0.16* 0.68* 0.97*   BNP Invalid input(s): POCBNP D-Dimer No results for input(s): DDIMER in the last 72 hours. Hemoglobin A1C No results for input(s): HGBA1C in the last 72 hours.  Fasting Lipid Panel  Recent Labs  05/06/17 1910  CHOL 203*  HDL 31*  LDLCALC 138*  TRIG 171*  CHOLHDL 6.5   Thyroid Function Tests No results for input(s): TSH, T4TOTAL, T3FREE, THYROIDAB in the last 72 hours.  Invalid input(s): FREET3  Telemetry    V paced - Personally Reviewed  ECG    n/a - Personally Reviewed  Radiology    Dg Chest 1 View  Result Date: 05/06/2017 IMPRESSION: Stable cardiomegaly and COPD. Electronically Signed   By: Elon Alas M.D.   On: 05/06/2017 18:26    Cardiac Studies   LHC pending  Patient Profile     83 y.o. female with history of CAD with NSTEMI in 12/2016 medically managed as the patient declined invasive evaluation, permanaent Afib on Coumadin s/p AV nodal ablation s/p MDT PPM, HTN, HLD, recurrent C diff colitis, non-Hodgkin's lymphoma, CLL, and pancreatic mass who presented to [facility] on 7/7 with chest pain. Found to have a NSTEMI.   Assessment & Plan    1.  NSTEMI: -Currently chest pain free -Troponin has trended to 0.97  -Heparin gtt -Hold Coumadin -She is for LHC this morning if INR is < 1.8 (level pending) -If INR remains too high will have to postpone LHC until 7/10 -TTE pending -A1c pending -LDL as above -Continue ASA 81 mg, Coreg, and Lipitor -Risks and benefits of cardiac catheterization have been discussed with the patient including risks of bleeding, bruising, infection, kidney damage, stroke, heart attack, and death. The patient understands these risks and is willing to proceed with the procedure. All questions have been answered and concerns listened to  2. Permanent Afib s/p AV nodal ablation: -A sensed V paced rhythm -On heparin gtt as above, resume Coumadin post LHC with Coumadin to heparin bridge -Coreg -CHADS2VASc at least 5 (HTN, age x 2, vascular disease, female)  3. HTN: -Controlled -Continue current medications  4. HLD: -Lipitor  Signed, Christell Faith, PA-C [facility] Pager: [phone] 05/08/2017, 7:06 AM

## 2017-05-08 NOTE — Progress Notes (Signed)
ANTICOAGULATION CONSULT NOTE - follow up [place] for heparin Indication: chest pain/ACS  Allergies  Allergen Reactions  . Amoxicillin   . Cefdinir Diarrhea  . Cilostazol     Other reaction(s): Headache  . Levofloxacin   . Plavix [Clopidogrel Bisulfate]     12/12/11 patient reports she has never taken Plavix before.   . Sulfur Nausea Only    Patient Measurements: Height: 5\' 2"  (157.5 cm) Weight: 114 lb 12.8 oz (52.1 kg) IBW/kg (Calculated) : 50.1 Heparin Dosing Weight: 52.1  Vital Signs: Temp: 97.4 F (36.3 C) (07/09 0812) Temp Source: Oral (07/09 0812) BP: 115/56 (07/09 0812) Pulse Rate: 50 (07/09 0812)  Labs:  Recent Labs  05/06/17 1804  05/06/17 1910 05/06/17 2258 05/07/17 0311 05/07/17 0448 05/07/17 0646 05/07/17 1309 05/07/17 2237 05/08/17 0639  HGB  --   < > 17.1*  --   --   --  16.8*  --   --  17.5*  HCT  --   --  48.4*  --   --   --  48.1*  --   --  50.8*  PLT  --   --  166  --   --   --  155  --   --  154  APTT  --   --   --   --   --  39*  --   --   --   --   LABPROT 24.6*  --   --   --   --   --   --   --   --  26.8*  INR 2.18  --   --   --   --   --   --   --   --  2.42  HEPARINUNFRC  --   --   --   --   --   --   --  0.27* 0.57 0.77*  CREATININE  --   --  0.78  --   --   --  0.75  --   --  0.62  TROPONINI  --   < > 0.03* 0.16* 0.68*  --  0.97*  --   --   --   < > = values in this interval not displayed.  Estimated Creatinine Clearance: 39.2 mL/min (by C-G formula based on SCr of 0.62 mg/dL).   Medical History: Past Medical History:  Diagnosis Date  . C. difficile colitis    feces transplant  . Coronary artery disease    a. NSTEMI 12/2016 => med Rx; b. Nuc 12/2016: low risk; c. NSTEMI 04/2017  . Diverticulitis   . GERD (gastroesophageal reflux disease)   . Hereditary hemochromatosis ([place])    Phlebotomized in the past, not getting phlebotomy now  . Hyperlipidemia   . Hypertension   . Non Hodgkin's lymphoma ([place])    stable;  followed by an oncologist  . Osteoarthritis   . 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 Pacemaker-dual-chamber-Medtronic 04/04/2011  . Permanent atrial fibrillation ([place]) 07/20/2009   a. s/p AV nodal ablation; b. s/p MDT PPM; c. CHADS2VASc => 5 (HTN, age x 2, vascular disease, female)    Medications:  Scheduled:  . aspirin EC  81 mg Oral Daily  . atorvastatin  40 mg Oral q1800  . carvedilol  3.125 mg Oral Daily  . fluticasone  2 spray Each Nare Daily  . folic acid  1 mg Oral Daily  . gabapentin  400 mg Oral QHS  . vancomycin  250 mg Oral Q6H    Assessment: Patient admitted for CP w/ trops up at 0.68 with non-Qwave MI. Patient has a h/o afib and is chronically anticoagulated w/ warfarin. Pt. Is being started on a heparin drip.  7/7 Current INR 2.18  Goal of Therapy:  Heparin level 0.3-0.7 units/ml Monitor platelets by anticoagulation protocol: Yes   Plan:  Will omit heparin bolus considering patient has a therapeutic INR. Will start heparin infusion @ 650 units/hr and will recheck HL @ 1300. Baseline labs ordered.  7/8: HL@1309 = 0.27. Will give bolus of 800 units and increase drip to 750 units/hr. Will recheck HL at 2230.  7/8 @ 2230 HL 0.57 therapeutic. Will continue current rate and will recheck HL @ 0700.  7/9 0639 HL supratherapeutic x 1. Will decrease to 700 units/hr and recheck HL in 8 hours.  Facundo Allemand A. Jordan Hawks, PharmD, BCPS Clinical Pharmacist 05/08/2017

## 2017-05-08 NOTE — Progress Notes (Addendum)
ANTICOAGULATION CONSULT NOTE - follow up [place] for heparin Indication: chest pain/ACS  Allergies  Allergen Reactions  . Amoxicillin   . Cefdinir Diarrhea  . Cilostazol     Other reaction(s): Headache  . Levofloxacin   . Plavix [Clopidogrel Bisulfate]     12/12/11 patient reports she has never taken Plavix before.   . Sulfur Nausea Only    Patient Measurements: Height: 5\' 2"  (157.5 cm) Weight: 114 lb 12.8 oz (52.1 kg) IBW/kg (Calculated) : 50.1 Heparin Dosing Weight: 52.1  Vital Signs: Temp: 97.6 F (36.4 C) (07/09 1152) Temp Source: Oral (07/09 1152) BP: 115/57 (07/09 1152) Pulse Rate: 60 (07/09 1152)  Labs:  Recent Labs  05/06/17 1804  05/06/17 1910 05/06/17 2258 05/07/17 0311 05/07/17 0448 05/07/17 0646  05/07/17 2237 05/08/17 0639 05/08/17 1735  HGB  --   < > 17.1*  --   --   --  16.8*  --   --  17.5*  --   HCT  --   --  48.4*  --   --   --  48.1*  --   --  50.8*  --   PLT  --   --  166  --   --   --  155  --   --  154  --   APTT  --   --   --   --   --  39*  --   --   --   --   --   LABPROT 24.6*  --   --   --   --   --   --   --   --  26.8*  --   INR 2.18  --   --   --   --   --   --   --   --  2.42  --   HEPARINUNFRC  --   --   --   --   --   --   --   < > 0.57 0.77* 0.58  CREATININE  --   --  0.78  --   --   --  0.75  --   --  0.62  --   TROPONINI  --   < > 0.03* 0.16* 0.68*  --  0.97*  --   --   --   --   < > = values in this interval not displayed.  Estimated Creatinine Clearance: 39.2 mL/min (by C-G formula based on SCr of 0.62 mg/dL).   Medical History: Past Medical History:  Diagnosis Date  . C. difficile colitis    feces transplant  . Coronary artery disease    a. NSTEMI 12/2016 => med Rx; b. Nuc 12/2016: low risk; c. NSTEMI 04/2017  . Diverticulitis   . GERD (gastroesophageal reflux disease)   . Hereditary hemochromatosis ([place])    Phlebotomized in the past, not getting phlebotomy now  . Hyperlipidemia   . Hypertension    . Non Hodgkin's lymphoma ([place])    stable; followed by an oncologist  . Osteoarthritis   . 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 Pacemaker-dual-chamber-Medtronic 04/04/2011  . Permanent atrial fibrillation ([place]) 07/20/2009   a. s/p AV nodal ablation; b. s/p MDT PPM; c. CHADS2VASc => 5 (HTN, age x 2, vascular disease, female)    Medications:  Scheduled:  . aspirin EC  81 mg Oral Daily  . atorvastatin  40 mg Oral q1800  . carvedilol  3.125 mg Oral Daily  .  fluticasone  2 spray Each Nare Daily  . folic acid  1 mg Oral Daily  . gabapentin  400 mg Oral QHS  . vancomycin  250 mg Oral Q6H    Assessment: Patient admitted for CP w/ trops up at 0.68 with non-Qwave MI. Patient has a h/o afib and is chronically anticoagulated w/ warfarin. Pt. Is being started on a heparin drip.  7/7 Current INR 2.18  Goal of Therapy:  Heparin level 0.3-0.7 units/ml Monitor platelets by anticoagulation protocol: Yes   Plan:  Will omit heparin bolus considering patient has a therapeutic INR. Will start heparin infusion @ 650 units/hr and will recheck HL @ 1300. Baseline labs ordered.  7/8: HL@1309 = 0.27. Will give bolus of 800 units and increase drip to 750 units/hr. Will recheck HL at 2230.  7/8 @ 2230 HL 0.57 therapeutic. Will continue current rate and will recheck HL @ 0700.  7/9 0639 HL supratherapeutic x 1. Will decrease to 700 units/hr and recheck HL in 8 hours.  7/9 1735 HL therapeutic x 1. Will continue rate at 700 units/hr and recheck HL in 8 hours. CBC with AM labs per protocol.   Pernell Dupre, PharmD, BCPS Clinical Pharmacist 05/08/2017 6:34 PM    7/10 0130 heparin level 0.43. Continue current regimen. Recheck heparin level and CBC with tomorrow AM labs.  Sim Boast, PharmD, BCPS  05/09/17 2:02 AM

## 2017-05-09 ENCOUNTER — Encounter: Payer: Self-pay | Admitting: *Deleted

## 2017-05-09 ENCOUNTER — Encounter
Admission: EM | Disposition: A | Discharge status: Home Health | Payer: Self-pay | Source: Home / Self Care | Attending: Internal Medicine

## 2017-05-09 DIAGNOSIS — R079 Chest pain, unspecified: Secondary | ICD-10-CM

## 2017-05-09 DIAGNOSIS — I482 Chronic atrial fibrillation: Secondary | ICD-10-CM

## 2017-05-09 DIAGNOSIS — R748 Abnormal levels of other serum enzymes: Secondary | ICD-10-CM

## 2017-05-09 DIAGNOSIS — I2510 Atherosclerotic heart disease of native coronary artery without angina pectoris: Secondary | ICD-10-CM

## 2017-05-09 HISTORY — PX: LEFT HEART CATH AND CORONARY ANGIOGRAPHY: CATH118249

## 2017-05-09 HISTORY — PX: CORONARY STENT INTERVENTION: CATH118234

## 2017-05-09 LAB — CBC
HEMATOCRIT: 47.9 % — AB (ref 35.0–47.0)
HEMOGLOBIN: 16.6 g/dL — AB (ref 12.0–16.0)
MCH: 31.7 pg (ref 26.0–34.0)
MCHC: 34.5 g/dL (ref 32.0–36.0)
MCV: 92 fL (ref 80.0–100.0)
Platelets: 178 10*3/uL (ref 150–440)
RBC: 5.21 MIL/uL — AB (ref 3.80–5.20)
RDW: 13.6 % (ref 11.5–14.5)
WBC: 6.7 10*3/uL (ref 3.6–11.0)

## 2017-05-09 LAB — PROTIME-INR
INR: 1.69
INR: 1.64
PROTHROMBIN TIME: 19.6 s — AB (ref 11.4–15.2)
Prothrombin Time: 20.1 seconds — ABNORMAL HIGH (ref 11.4–15.2)

## 2017-05-09 LAB — HEPARIN LEVEL (UNFRACTIONATED): HEPARIN UNFRACTIONATED: 0.43 [IU]/mL (ref 0.30–0.70)

## 2017-05-09 LAB — POCT ACTIVATED CLOTTING TIME
ACTIVATED CLOTTING TIME: 219 s
Activated Clotting Time: 378 seconds

## 2017-05-09 SURGERY — LEFT HEART CATH AND CORONARY ANGIOGRAPHY
Anesthesia: Moderate Sedation

## 2017-05-09 MED ORDER — SODIUM CHLORIDE 0.9 % IV SOLN: 250.0000 mL | INTRAVENOUS | Status: DC | PRN

## 2017-05-09 MED ORDER — MIDAZOLAM HCL 2 MG/2ML IJ SOLN
INTRAMUSCULAR | Status: AC
  Filled 2017-05-09: qty 2

## 2017-05-09 MED ORDER — VERAPAMIL HCL 2.5 MG/ML IV SOLN
INTRAVENOUS | Status: AC
  Filled 2017-05-09: qty 2

## 2017-05-09 MED ORDER — SODIUM CHLORIDE 0.9% FLUSH: 3.0000 mL | Freq: Two times a day (BID) | INTRAVENOUS | Status: DC

## 2017-05-09 MED ORDER — SODIUM CHLORIDE 0.9 % WEIGHT BASED INFUSION
1.0000 mL/kg/h | INTRAVENOUS | Status: DC
Start: 2017-05-09 — End: 2017-05-09

## 2017-05-09 MED ORDER — FENTANYL CITRATE (PF) 100 MCG/2ML IJ SOLN
INTRAMUSCULAR | Status: AC
  Filled 2017-05-09: qty 2

## 2017-05-09 MED ORDER — CLOPIDOGREL BISULFATE 75 MG PO TABS
ORAL_TABLET | ORAL | Status: AC
  Filled 2017-05-09: qty 8

## 2017-05-09 MED ORDER — HEPARIN SODIUM (PORCINE) 1000 UNIT/ML IJ SOLN
INTRAMUSCULAR | Status: AC
  Filled 2017-05-09: qty 1

## 2017-05-09 MED ORDER — SODIUM CHLORIDE 0.9% FLUSH: 3.0000 mL | INTRAVENOUS | Status: DC | PRN

## 2017-05-09 MED ORDER — SODIUM CHLORIDE 0.9 % WEIGHT BASED INFUSION: 1.0000 mL/kg/h | INTRAVENOUS | Status: DC

## 2017-05-09 MED ORDER — MIDAZOLAM HCL 2 MG/2ML IJ SOLN
INTRAMUSCULAR | Status: DC | PRN
  Administered 2017-05-09: 1 mg via INTRAVENOUS

## 2017-05-09 MED ORDER — SODIUM CHLORIDE 0.9% FLUSH
3.0000 mL | Freq: Two times a day (BID) | INTRAVENOUS | Status: DC
  Administered 2017-05-10: 3 mL via INTRAVENOUS

## 2017-05-09 MED ORDER — IOPAMIDOL (ISOVUE-300) INJECTION 61%
INTRAVENOUS | Status: DC | PRN
  Administered 2017-05-09: 170 mL via INTRA_ARTERIAL

## 2017-05-09 MED ORDER — HEPARIN SODIUM (PORCINE) 1000 UNIT/ML IJ SOLN
INTRAMUSCULAR | Status: DC | PRN
  Administered 2017-05-09: 3000 [IU] via INTRAVENOUS
  Administered 2017-05-09: 2500 [IU] via INTRAVENOUS

## 2017-05-09 MED ORDER — NITROGLYCERIN 1 MG/10 ML FOR IR/CATH LAB
INTRA_ARTERIAL | Status: DC | PRN
  Administered 2017-05-09: 200 ug via INTRACORONARY

## 2017-05-09 MED ORDER — WARFARIN - PHARMACIST DOSING INPATIENT: Freq: Every day | Status: DC

## 2017-05-09 MED ORDER — NITROGLYCERIN 0.4 MG SL SUBL
SUBLINGUAL_TABLET | SUBLINGUAL | Status: AC
  Administered 2017-05-09: 0.4 mg via SUBLINGUAL
  Filled 2017-05-09: qty 1

## 2017-05-09 MED ORDER — ASPIRIN 81 MG PO CHEW
CHEWABLE_TABLET | ORAL | Status: DC | PRN
  Administered 2017-05-09: 243 mg via ORAL

## 2017-05-09 MED ORDER — VERAPAMIL HCL 2.5 MG/ML IV SOLN
INTRAVENOUS | Status: DC | PRN
  Administered 2017-05-09: 2.5 mg via INTRA_ARTERIAL

## 2017-05-09 MED ORDER — WARFARIN SODIUM 3 MG PO TABS
3.0000 mg | ORAL_TABLET | Freq: Once | ORAL | Status: DC
  Filled 2017-05-09: qty 1

## 2017-05-09 MED ORDER — SODIUM CHLORIDE 0.9 % WEIGHT BASED INFUSION: 3.0000 mL/kg/h | INTRAVENOUS | Status: DC

## 2017-05-09 MED ORDER — CLOPIDOGREL BISULFATE 75 MG PO TABS
75.0000 mg | ORAL_TABLET | Freq: Every day | ORAL | Status: DC
  Administered 2017-05-10: 75 mg via ORAL
  Filled 2017-05-09: qty 1

## 2017-05-09 MED ORDER — CLOPIDOGREL BISULFATE 300 MG PO TABS
600.0000 mg | ORAL_TABLET | Freq: Once | ORAL | Status: AC
  Administered 2017-05-09: 600 mg via ORAL

## 2017-05-09 MED ORDER — ASPIRIN 81 MG PO CHEW: 81.0000 mg | CHEWABLE_TABLET | ORAL | Status: DC

## 2017-05-09 MED ORDER — ASPIRIN 81 MG PO CHEW
CHEWABLE_TABLET | ORAL | Status: AC
  Filled 2017-05-09: qty 3

## 2017-05-09 MED ORDER — NITROGLYCERIN 5 MG/ML IV SOLN
INTRAVENOUS | Status: AC
  Filled 2017-05-09: qty 10

## 2017-05-09 SURGICAL SUPPLY — 15 items
BALLN MINITREK RX 2.0X12 (BALLOONS) ×3
BALLN ~~LOC~~ TREK RX 2.5X8 (BALLOONS) ×3
BALLOON MINITREK RX 2.0X12 (BALLOONS) IMPLANT
BALLOON ~~LOC~~ TREK RX 2.5X8 (BALLOONS) IMPLANT
CATH OPTITORQUE JACKY 4.0 5F (CATHETERS) ×2 IMPLANT
CATH VISTA GUIDE 6FR XB3 (CATHETERS) ×2 IMPLANT
DEVICE INFLAT 30 PLUS (MISCELLANEOUS) ×2 IMPLANT
DEVICE RAD TR BAND REGULAR (VASCULAR PRODUCTS) ×2 IMPLANT
GLIDESHEATH SLEND SS 6F .021 (SHEATH) ×2 IMPLANT
KIT MANI 3VAL PERCEP (MISCELLANEOUS) ×3 IMPLANT
PACK CARDIAC CATH (CUSTOM PROCEDURE TRAY) ×3 IMPLANT
STENT RESOLUTE ONYX 2.0X12 (Permanent Stent) ×2 IMPLANT
WIRE HITORQ VERSACORE ST 145CM (WIRE) ×2 IMPLANT
WIRE INTUITION PROPEL ST 180CM (WIRE) ×2 IMPLANT
WIRE ROSEN-J .035X260CM (WIRE) ×2 IMPLANT

## 2017-05-09 NOTE — Interval H&P Note (Signed)
History and Physical Interval Note:  05/09/2017 1:20 PM  [name]  has presented today for surgery, with the diagnosis of Myocardial Infarction  The various methods of treatment have been discussed with the patient and family. After consideration of risks, benefits and other options for treatment, the patient has consented to  Procedure(s): Left Heart Cath and Coronary Angiography (N/A) as a surgical intervention .  The patient's history has been reviewed, patient examined, no change in status, stable for surgery.  I have reviewed the patient's chart and labs.  Questions were answered to the patient's satisfaction.     Kathlyn [place]

## 2017-05-09 NOTE — Progress Notes (Signed)
Dr Fletcher Anon notified of hematoma to right wrist with TR band deflation x 2, right hand assessment. Will continue to monitor, no new orders at this time.   Jessica updated.

## 2017-05-09 NOTE — Progress Notes (Signed)
[place] [facility] [place] NAME: [name]    MR#:  [id number]  DATE OF BIRTH:  April 17, 1928  SUBJECTIVE:  Patient seen in specialty recovery. Denies any chest pain.  REVIEW OF SYSTEMS:   Review of Systems  Constitutional: Negative for chills, fever and weight loss.  HENT: Negative for ear discharge, ear pain and nosebleeds.   Eyes: Negative for blurred vision, pain and discharge.  Respiratory: Negative for sputum production, shortness of breath, wheezing and stridor.   Cardiovascular: Negative for chest pain, palpitations, orthopnea and PND.  Gastrointestinal: Negative for abdominal pain, diarrhea, nausea and vomiting.  Genitourinary: Negative for frequency and urgency.  Musculoskeletal: Negative for back pain and joint pain.  Neurological: Positive for weakness. Negative for sensory change, speech change and focal weakness.  Psychiatric/Behavioral: Negative for depression and hallucinations. The patient is not nervous/anxious.    Tolerating Diet:yes Tolerating PT: pending  DRUG ALLERGIES:   Allergies  Allergen Reactions  . Amoxicillin   . Cefdinir Diarrhea  . Cilostazol     Other reaction(s): Headache  . Levofloxacin   . Sulfur Nausea Only    VITALS:  Blood pressure 138/86, pulse 60, temperature (!) 97.5 F (36.4 C), temperature source Oral, resp. rate (!) 24, height 5\' 2"  (1.575 m), weight 52.6 kg (116 lb), SpO2 94 %.  PHYSICAL EXAMINATION:   Physical Exam  GENERAL:  83 y.o.-year-old patient lying in the bed with no acute distress.  EYES: Pupils equal, round, reactive to light and accommodation. No scleral icterus. Extraocular muscles intact.  HEENT: Head atraumatic, normocephalic. Oropharynx and nasopharynx clear.  NECK:  Supple, no jugular venous distention. No thyroid enlargement, no tenderness.  LUNGS: Normal breath sounds bilaterally, no wheezing, rales, rhonchi. No use of accessory muscles of respiration.   CARDIOVASCULAR: S1, S2 normal. No murmurs, rubs, or gallops.  ABDOMEN: Soft, nontender, nondistended. Bowel sounds present. No organomegaly or mass.  EXTREMITIES: No cyanosis, clubbing or edema b/l.    NEUROLOGIC: Cranial nerves II through XII are intact. No focal Motor or sensory deficits b/l.   PSYCHIATRIC:  patient is alert and oriented x 3.  SKIN: No obvious rash, lesion, or ulcer.   LABORATORY PANEL:  CBC  Recent Labs Lab 05/09/17 0128  WBC 6.7  HGB 16.6*  HCT 47.9*  PLT 178    Chemistries   Recent Labs Lab 05/06/17 1910 05/07/17 0646 05/08/17 0639  NA 134* 136 136  K 4.4 4.1 4.0  CL 103 104 105  CO2 25 26 24   GLUCOSE 116* 111* 113*  BUN 14 10 17   CREATININE 0.78 0.75 0.62  CALCIUM 9.9 8.7* 8.9  MG  --  2.2  --   AST 63*  --   --   ALT 63*  --   --   ALKPHOS 90  --   --   BILITOT 0.9  --   --    Cardiac Enzymes  Recent Labs Lab 05/07/17 0646  TROPONINI 0.97*   RADIOLOGY:  No results found. ASSESSMENT AND PLAN:   [name]  is a 83 y.o. female with a known history of Atrial fibrillation, coronary artery disease, no stent was done in February 2018 but her stress test was negative for acute ischemia in March 2018, pacemaker placement, hyperlipidemia, hypertension, normal skin lymphoma- started having chest pain which was central and pressure-like this morning.   *Acute mild non-Q-wave MI -Patient presented with on and off chest pain -Troponin 0.03--- 0.16--- 0.68--- 0.97 -  She has history of coronary artery disease, continue aspirin, beta blockers.  -Cardiology consult.----Appreciated. Cardiac catheterization was done via the right radial artery which showed severe one-vessel coronary artery disease with 99% stenosis in the mid left circumflex. This was treated successfully with PCI and drug-eluting stent placement. -Patient now on aspirin and Plavix. -Resume Coumadin tonight.----Once INR is therapeutic discontinue aspirin to prevent bleeding  *  Hypertension   Continue beta blockers.  * Chronic Atrial fibrillation status post AV nodal ablation   Rate is under control, continue beta blocker,  * Anxiety   Continue alprazolam as needed.  D/w dter   Case discussed with Care Management/Social Worker. Management plans discussed with the patient, family and they are in agreement.  CODE STATUS: DO NOT RESUSCITATE  DVT Prophylaxis: coumadin  TOTAL TIME TAKING CARE OF THIS PATIENT: 30 minutes.  >50% time spent on counselling and coordination of care  POSSIBLE D/C IN  1-2 DAYS, DEPENDING ON CLINICAL CONDITION.  Note: This dictation was prepared with Dragon dictation along with smaller phrase technology. Any transcriptional errors that result from this process are unintentional.  Takerra Lupinacci M.D on 05/09/2017 at 3:21 PM  Between 7am to 6pm - Pager - [phone]  After 6pm go to [email or web address] - password [email or web address]  Office  [phone]  CC: Primary care physician; Adin Hector, MD

## 2017-05-09 NOTE — Progress Notes (Signed)
ANTICOAGULATION CONSULT NOTE - Initial Consult  Pharmacy Consult for warfarin Indication: atrial fibrillation  Allergies  Allergen Reactions  . Amoxicillin   . Cefdinir Diarrhea  . Cilostazol     Other reaction(s): Headache  . Levofloxacin   . Sulfur Nausea Only    Patient Measurements: Height: 5\' 2"  (157.5 cm) Weight: 116 lb (52.6 kg) IBW/kg (Calculated) : 50.1  Vital Signs: Temp: 97.5 F (36.4 C) (07/10 1308) Temp Source: Oral (07/10 1308) BP: 115/75 (07/10 1308) Pulse Rate: 96 (07/10 1308)  Labs:  Recent Labs  05/06/17 1910 05/06/17 2258 05/07/17 0311 05/07/17 0448 05/07/17 6160  05/08/17 0639 05/08/17 1735 05/09/17 0128 05/09/17 0517  HGB 17.1*  --   --   --  16.8*  --  17.5*  --  16.6*  --   HCT 48.4*  --   --   --  48.1*  --  50.8*  --  47.9*  --   PLT 166  --   --   --  155  --  154  --  178  --   APTT  --   --   --  39*  --   --   --   --   --   --   LABPROT  --   --   --   --   --   --  26.8*  --  20.1* 19.6*  INR  --   --   --   --   --   --  2.42  --  1.69 1.64  HEPARINUNFRC  --   --   --   --   --   < > 0.77* 0.58 0.43  --   CREATININE 0.78  --   --   --  0.75  --  0.62  --   --   --   TROPONINI 0.03* 0.16* 0.68*  --  0.97*  --   --   --   --   --   < > = values in this interval not displayed.  Estimated Creatinine Clearance: 39.2 mL/min (by C-G formula based on SCr of 0.62 mg/dL).   Assessment: 83 yo female s/p cath resuming warfarin for AFib. Per hospitalist no need to bridge with heparin.   Of note, per cards note once INR is therapeutic, aspirin can be discontinued to minimize the risk of bleeding.  Home dose noted to be warfarin 2 mg daily per Coumadin clinic notes.   7/10 INR 1.64  Goal of Therapy:  INR 2-3 Monitor platelets by anticoagulation protocol: Yes   Plan:  INR subtherapeutic will give higher dose of 3 mg for tonight. Likely will be able to resume home regimen Daily INR and CBC every three days at least  Will need to  confirm home dose with patient tomorrow and may need to update PTA med list. Pt currently off floor for cath.   Pharmacy will continue to follow.   Rayna Sexton L 05/09/2017,2:45 PM

## 2017-05-09 NOTE — H&P (View-Only) (Signed)
Patient Name: [name] Date of Encounter: 05/09/2017  Primary Cardiologist: [facility] Problem List     Principal Problem:   Non-ST elevation (NSTEMI) myocardial infarction [facility]) Active Problems:   Chronic atrial fibrillation (HCC)   Pacemaker-dual-chamber-Medtronic   Hypertension   CLL (chronic lymphocytic leukemia) (HCC)   NSTEMI (non-ST elevated myocardial infarction) (HCC)     Subjective   No further chest pain.  INR 1.6 On heparin infusion, for cardiac cath today  Inpatient Medications    Scheduled Meds: . aspirin EC  81 mg Oral Daily  . atorvastatin  40 mg Oral q1800  . carvedilol  3.125 mg Oral Daily  . fluticasone  2 spray Each Nare Daily  . folic acid  1 mg Oral Daily  . gabapentin  400 mg Oral QHS  . sodium chloride flush  3 mL Intravenous Q12H  . sodium chloride flush  3 mL Intravenous Q12H   Continuous Infusions: . sodium chloride    . sodium chloride    . [START ON 05/10/2017] sodium chloride     Followed by  . [START ON 05/10/2017] sodium chloride    . sodium chloride     Followed by  . sodium chloride    . heparin 700 Units/hr (05/08/17 0826)   PRN Meds: sodium chloride, sodium chloride, ALPRAZolam, docusate sodium, nitroGLYCERIN, polyvinyl alcohol, sodium chloride flush, sodium chloride flush   Vital Signs    Vitals:   05/08/17 1952 05/09/17 0321 05/09/17 0559 05/09/17 0745  BP: 127/67 126/71  111/63  Pulse: (!) 59 (!) 50  (!) 52  Resp: 18 18  20   Temp: 97.6 F (36.4 C) 97.7 F (36.5 C)    TempSrc: Oral Oral    SpO2: 93% 98%  95%  Weight:   116 lb 6.4 oz (52.8 kg)   Height:        Intake/Output Summary (Last 24 hours) at 05/09/17 1006 Last data filed at 05/09/17 0700  Gross per 24 hour  Intake              536 ml  Output             2100 ml  Net            -1564 ml   Filed Weights   05/06/17 1803 05/06/17 2222 05/09/17 0559  Weight: 116 lb (52.6 kg) 114 lb 12.8 oz (52.1 kg) 116 lb 6.4 oz (52.8 kg)    Physical  Exam    GEN: Thin, elderly,  in no acute distress.  HEENT: Grossly normal.  Neck: Supple, no JVD, carotid bruits, or masses. Cardiac: RRR, no murmurs, rubs, or gallops. No clubbing, cyanosis, edema.  Radials/DP/PT 2+ and equal bilaterally.  Respiratory:  Respirations regular and unlabored, clear to auscultation bilaterally. GI: Soft, nontender, nondistended, BS + x 4. MS: no deformity or atrophy. Skin: warm and dry, no rash. Neuro:  Strength and sensation are intact. Psych: AAOx3.  Normal affect.  Labs    CBC  Recent Labs  05/06/17 1910  05/08/17 0639 05/09/17 0128  WBC 8.7  < > 6.7 6.7  NEUTROABS 4.8  --   --   --   HGB 17.1*  < > 17.5* 16.6*  HCT 48.4*  < > 50.8* 47.9*  MCV 93.6  < > 90.8 92.0  PLT 166  < > 154 178  < > = values in this interval not displayed. Basic Metabolic Panel  Recent Labs  05/07/17 0646 05/08/17 [phone]  NA 136 136  K 4.1 4.0  CL 104 105  CO2 26 24  GLUCOSE 111* 113*  BUN 10 17  CREATININE 0.75 0.62  CALCIUM 8.7* 8.9  MG 2.2  --    Liver Function Tests  Recent Labs  05/06/17 1910  AST 63*  ALT 63*  ALKPHOS 90  BILITOT 0.9  PROT 6.3*  ALBUMIN 3.7   No results for input(s): LIPASE, AMYLASE in the last 72 hours. Cardiac Enzymes  Recent Labs  05/06/17 2258 05/07/17 0311 05/07/17 0646  TROPONINI 0.16* 0.68* 0.97*   BNP Invalid input(s): POCBNP D-Dimer No results for input(s): DDIMER in the last 72 hours. Hemoglobin A1C  Recent Labs  05/06/17 2258  HGBA1C 6.3*   Fasting Lipid Panel  Recent Labs  05/06/17 1910  CHOL 203*  HDL 31*  LDLCALC 138*  TRIG 171*  CHOLHDL 6.5   Thyroid Function Tests No results for input(s): TSH, T4TOTAL, T3FREE, THYROIDAB in the last 72 hours.  Invalid input(s): FREET3  Telemetry    V paced - Personally Reviewed  ECG      Radiology    Dg Chest 1 View  Result Date: 05/06/2017 IMPRESSION: Stable cardiomegaly and COPD. Electronically Signed   By: Elon Alas M.D.   On:  05/06/2017 18:26    Cardiac Studies   LHC pending  Patient Profile     83 y.o. female with history of CAD with NSTEMI in 12/2016 medically managed as the patient declined invasive evaluation, permanaent Afib on Coumadin s/p AV nodal ablation s/p MDT PPM, HTN, HLD, recurrent C diff colitis, non-Hodgkin's lymphoma, CLL, and pancreatic mass who presented to [facility] on 7/7 with chest pain. Found to have a NSTEMI.   Assessment & Plan    1. NSTEMI:  chest pain free -Troponinto 0.97  r LHC this morning with Dr. Fletcher Anon -Continue ASA 81 mg, Coreg, and Lipitor I have reviewed the risks, indications, and alternatives to cardiac catheterization, possible angioplasty, and stenting with the patient. Risks include but are not limited to bleeding, infection, vascular injury, stroke, myocardial infection, arrhythmia, kidney injury, radiation-related injury in the case of prolonged fluoroscopy use, emergency cardiac surgery, and death. The patient understands the risks of serious complication is 1-2 in 3295 with diagnostic cardiac cath and 1-2% or less with angioplasty/stenting.  Consent signed  2. Permanent Afib s/p AV nodal ablation: -A sensed V paced rhythm -On heparin gtt as above, resume Coumadin post LHC with Coumadin to heparin bridge -Coreg -CHADS2VASc at least 5 (HTN, age x 2, vascular disease, female)  3. HTN: -Controlled -Continue current medications  4. HLD: -Lipitor  Signed, Signed, Esmond Plants, MD, Ph.D [facility] 05/09/2017, 10:06 AM

## 2017-05-09 NOTE — CV Procedure (Signed)
Cardiac catheterization was done via the right radial artery which showed severe one-vessel coronary artery disease with 99% stenosis in the mid left circumflex. This was treated successfully with PCI and drug-eluting stent placement.  Recommendations: Resume warfarin tonight. Continue dual antiplatelet therapy with aspirin and Plavix. Once INR is therapeutic, aspirin can be discontinued to minimize the risk of bleeding.

## 2017-05-09 NOTE — Progress Notes (Signed)
Dr Fletcher Anon updated on patient progress with TR band deflation, also patient compliant of chest discomfort/"winded" feeling. Order to administer nitro sublingual.   Updated Janett Billow RN 2A

## 2017-05-09 NOTE — Progress Notes (Signed)
Patient Name: [name] Date of Encounter: 05/09/2017  Primary Cardiologist: [facility] Problem List     Principal Problem:   Non-ST elevation (NSTEMI) myocardial infarction [facility]) Active Problems:   Chronic atrial fibrillation (HCC)   Pacemaker-dual-chamber-Medtronic   Hypertension   CLL (chronic lymphocytic leukemia) (HCC)   NSTEMI (non-ST elevated myocardial infarction) (HCC)     Subjective   No further chest pain.  INR 1.6 On heparin infusion, for cardiac cath today  Inpatient Medications    Scheduled Meds: . aspirin EC  81 mg Oral Daily  . atorvastatin  40 mg Oral q1800  . carvedilol  3.125 mg Oral Daily  . fluticasone  2 spray Each Nare Daily  . folic acid  1 mg Oral Daily  . gabapentin  400 mg Oral QHS  . sodium chloride flush  3 mL Intravenous Q12H  . sodium chloride flush  3 mL Intravenous Q12H   Continuous Infusions: . sodium chloride    . sodium chloride    . [START ON 05/10/2017] sodium chloride     Followed by  . [START ON 05/10/2017] sodium chloride    . sodium chloride     Followed by  . sodium chloride    . heparin 700 Units/hr (05/08/17 0826)   PRN Meds: sodium chloride, sodium chloride, ALPRAZolam, docusate sodium, nitroGLYCERIN, polyvinyl alcohol, sodium chloride flush, sodium chloride flush   Vital Signs    Vitals:   05/08/17 1952 05/09/17 0321 05/09/17 0559 05/09/17 0745  BP: 127/67 126/71  111/63  Pulse: (!) 59 (!) 50  (!) 52  Resp: 18 18  20   Temp: 97.6 F (36.4 C) 97.7 F (36.5 C)    TempSrc: Oral Oral    SpO2: 93% 98%  95%  Weight:   116 lb 6.4 oz (52.8 kg)   Height:        Intake/Output Summary (Last 24 hours) at 05/09/17 1006 Last data filed at 05/09/17 0700  Gross per 24 hour  Intake              536 ml  Output             2100 ml  Net            -1564 ml   Filed Weights   05/06/17 1803 05/06/17 2222 05/09/17 0559  Weight: 116 lb (52.6 kg) 114 lb 12.8 oz (52.1 kg) 116 lb 6.4 oz (52.8 kg)    Physical  Exam    GEN: Thin, elderly,  in no acute distress.  HEENT: Grossly normal.  Neck: Supple, no JVD, carotid bruits, or masses. Cardiac: RRR, no murmurs, rubs, or gallops. No clubbing, cyanosis, edema.  Radials/DP/PT 2+ and equal bilaterally.  Respiratory:  Respirations regular and unlabored, clear to auscultation bilaterally. GI: Soft, nontender, nondistended, BS + x 4. MS: no deformity or atrophy. Skin: warm and dry, no rash. Neuro:  Strength and sensation are intact. Psych: AAOx3.  Normal affect.  Labs    CBC  Recent Labs  05/06/17 1910  05/08/17 0639 05/09/17 0128  WBC 8.7  < > 6.7 6.7  NEUTROABS 4.8  --   --   --   HGB 17.1*  < > 17.5* 16.6*  HCT 48.4*  < > 50.8* 47.9*  MCV 93.6  < > 90.8 92.0  PLT 166  < > 154 178  < > = values in this interval not displayed. Basic Metabolic Panel  Recent Labs  05/07/17 0646 05/08/17 [phone]  NA 136 136  K 4.1 4.0  CL 104 105  CO2 26 24  GLUCOSE 111* 113*  BUN 10 17  CREATININE 0.75 0.62  CALCIUM 8.7* 8.9  MG 2.2  --    Liver Function Tests  Recent Labs  05/06/17 1910  AST 63*  ALT 63*  ALKPHOS 90  BILITOT 0.9  PROT 6.3*  ALBUMIN 3.7   No results for input(s): LIPASE, AMYLASE in the last 72 hours. Cardiac Enzymes  Recent Labs  05/06/17 2258 05/07/17 0311 05/07/17 0646  TROPONINI 0.16* 0.68* 0.97*   BNP Invalid input(s): POCBNP D-Dimer No results for input(s): DDIMER in the last 72 hours. Hemoglobin A1C  Recent Labs  05/06/17 2258  HGBA1C 6.3*   Fasting Lipid Panel  Recent Labs  05/06/17 1910  CHOL 203*  HDL 31*  LDLCALC 138*  TRIG 171*  CHOLHDL 6.5   Thyroid Function Tests No results for input(s): TSH, T4TOTAL, T3FREE, THYROIDAB in the last 72 hours.  Invalid input(s): FREET3  Telemetry    V paced - Personally Reviewed  ECG      Radiology    Dg Chest 1 View  Result Date: 05/06/2017 IMPRESSION: Stable cardiomegaly and COPD. Electronically Signed   By: Elon Alas M.D.   On:  05/06/2017 18:26    Cardiac Studies   LHC pending  Patient Profile     83 y.o. female with history of CAD with NSTEMI in 12/2016 medically managed as the patient declined invasive evaluation, permanaent Afib on Coumadin s/p AV nodal ablation s/p MDT PPM, HTN, HLD, recurrent C diff colitis, non-Hodgkin's lymphoma, CLL, and pancreatic mass who presented to [facility] on 7/7 with chest pain. Found to have a NSTEMI.   Assessment & Plan    1. NSTEMI:  chest pain free -Troponinto 0.97  r LHC this morning with Dr. Fletcher Anon -Continue ASA 81 mg, Coreg, and Lipitor I have reviewed the risks, indications, and alternatives to cardiac catheterization, possible angioplasty, and stenting with the patient. Risks include but are not limited to bleeding, infection, vascular injury, stroke, myocardial infection, arrhythmia, kidney injury, radiation-related injury in the case of prolonged fluoroscopy use, emergency cardiac surgery, and death. The patient understands the risks of serious complication is 1-2 in 0947 with diagnostic cardiac cath and 1-2% or less with angioplasty/stenting.  Consent signed  2. Permanent Afib s/p AV nodal ablation: -A sensed V paced rhythm -On heparin gtt as above, resume Coumadin post LHC with Coumadin to heparin bridge -Coreg -CHADS2VASc at least 5 (HTN, age x 2, vascular disease, female)  3. HTN: -Controlled -Continue current medications  4. HLD: -Lipitor  Signed, Signed, Esmond Plants, MD, Ph.D [facility] 05/09/2017, 10:06 AM

## 2017-05-10 ENCOUNTER — Telehealth: Payer: Self-pay | Admitting: Cardiovascular Disease

## 2017-05-10 ENCOUNTER — Encounter: Payer: Self-pay | Admitting: Cardiovascular Disease

## 2017-05-10 DIAGNOSIS — Z950 Presence of cardiac pacemaker: Secondary | ICD-10-CM

## 2017-05-10 DIAGNOSIS — Z955 Presence of coronary angioplasty implant and graft: Secondary | ICD-10-CM

## 2017-05-10 LAB — BASIC METABOLIC PANEL
ANION GAP: 6 (ref 5–15)
BUN: 11 mg/dL (ref 6–20)
CO2: 23 mmol/L (ref 22–32)
Calcium: 8.7 mg/dL — ABNORMAL LOW (ref 8.9–10.3)
Chloride: 106 mmol/L (ref 101–111)
Creatinine, Ser: 0.54 mg/dL (ref 0.44–1.00)
GFR calc Af Amer: >60 mL/min (ref >60)
GLUCOSE: 98 mg/dL (ref 65–99)
POTASSIUM: 5.4 mmol/L — AB (ref 3.5–5.1)
Sodium: 135 mmol/L (ref 135–145)

## 2017-05-10 LAB — CBC
HEMATOCRIT: 46.6 % (ref 35.0–47.0)
Hemoglobin: 16 g/dL (ref 12.0–16.0)
MCH: 31 pg (ref 26.0–34.0)
MCHC: 34.2 g/dL (ref 32.0–36.0)
MCV: 90.7 fL (ref 80.0–100.0)
PLATELETS: 152 10*3/uL (ref 150–440)
RBC: 5.14 MIL/uL (ref 3.80–5.20)
RDW: 13.4 % (ref 11.5–14.5)
WBC: 7.8 10*3/uL (ref 3.6–11.0)

## 2017-05-10 LAB — PROTIME-INR
INR: 1.34
Prothrombin Time: 16.7 seconds — ABNORMAL HIGH (ref 11.4–15.2)

## 2017-05-10 LAB — POTASSIUM: POTASSIUM: 4.1 mmol/L (ref 3.5–5.1)

## 2017-05-10 MED ORDER — SODIUM POLYSTYRENE SULFONATE 15 GM/60ML PO SUSP
30.0000 g | ORAL | Status: AC
  Administered 2017-05-10: 30 g via ORAL
  Filled 2017-05-10: qty 120

## 2017-05-10 MED ORDER — ATORVASTATIN CALCIUM 40 MG PO TABS: 40.0000 mg | ORAL_TABLET | Freq: Every day | ORAL | 1 refills | Status: AC

## 2017-05-10 MED ORDER — WARFARIN SODIUM 3 MG PO TABS
3.0000 mg | ORAL_TABLET | Freq: Every day | ORAL | Status: DC
  Filled 2017-05-10: qty 1

## 2017-05-10 MED ORDER — CLOPIDOGREL BISULFATE 75 MG PO TABS: 75.0000 mg | ORAL_TABLET | Freq: Every day | ORAL | 1 refills | Status: AC

## 2017-05-10 MED ORDER — WARFARIN SODIUM 3 MG PO TABS: 3.0000 mg | ORAL_TABLET | Freq: Every day | ORAL | 0 refills | Status: AC

## 2017-05-10 NOTE — Telephone Encounter (Signed)
Currently admitted.

## 2017-05-10 NOTE — Progress Notes (Signed)
ANTICOAGULATION CONSULT NOTE - Initial Consult  Pharmacy Consult for warfarin Indication: atrial fibrillation  Allergies  Allergen Reactions  . Amoxicillin   . Cefdinir Diarrhea  . Cilostazol     Other reaction(s): Headache  . Levofloxacin   . Sulfur Nausea Only    Patient Measurements: Height: 5\' 2"  (157.5 cm) Weight: 116 lb (52.6 kg) IBW/kg (Calculated) : 50.1  Vital Signs: Temp: 97.8 F (36.6 C) (07/11 0432) Temp Source: Oral (07/11 0432) BP: 106/48 (07/11 0432) Pulse Rate: 50 (07/11 0432)  Labs:  Recent Labs  05/08/17 0639 05/08/17 1735 05/09/17 0128 05/09/17 0517 05/10/17 0336  HGB 17.5*  --  16.6*  --  16.0  HCT 50.8*  --  47.9*  --  46.6  PLT 154  --  178  --  152  LABPROT 26.8*  --  20.1* 19.6* 16.7*  INR 2.42  --  1.69 1.64 1.34  HEPARINUNFRC 0.77* 0.58 0.43  --   --   CREATININE 0.62  --   --   --  0.54    Estimated Creatinine Clearance: 39.2 mL/min (by C-G formula based on SCr of 0.54 mg/dL).   Assessment: 83 yo female s/p cath resuming warfarin for AFib. Per hospitalist no need to bridge with heparin.   Of note, per cards note once INR is therapeutic, aspirin can be discontinued to minimize the risk of bleeding.  Home dose noted to be warfarin 2 mg daily per Coumadin clinic notes.   DATE  INR  DOSE 7/10  1.64  MISSED 7/11  1.34    Goal of Therapy:  INR 2-3 Monitor platelets by anticoagulation protocol: Yes   Plan:  INR subtherapeutic and dose last night was not given - no documentation as to why dose was not given. Will give higher dose of 3 mg for tonight. Likely will be able to resume home regimen Daily INR and CBC every three days at least  Will need to confirm home dose with patient tomorrow and may need to update PTA med list. Pt currently off floor for cath.   Pharmacy will continue to follow.   Laural Benes, Pharm.D., BCPS Clinical Pharmacist 05/10/2017,8:45 AM

## 2017-05-10 NOTE — Progress Notes (Signed)
Discharge instruction explained to pt and pts son/ verbalized an understanding/ iv and tele removed/ transported off unit via wheelchair.

## 2017-05-10 NOTE — Care Management Important Message (Signed)
Important Message  Patient Details  Name: [name] MRN: [id number] Date of Birth: Dec 25, 1927   Medicare Important Message Given:  Yes    Katrina Stack, RN 05/10/2017, 9:15 AM

## 2017-05-10 NOTE — Care Management (Signed)
Patient for discharge home today and agreeable to have home health nursing service.  No agency preference.  Referral for home health nurse called and accepted by [facility]. Discussed need for protime on 7/16.  Family to transport home

## 2017-05-10 NOTE — Telephone Encounter (Signed)
Patient tcm ph for stent placement dc today 7/11 needs 1 week fu per Danae Chen on Telemetry.  Scheduled with gollan on 7/17  At 2

## 2017-05-10 NOTE — Progress Notes (Signed)
Progress Note  Patient Name: [name] Date of Encounter: 05/10/2017  Primary Cardiologist: Rockey Situ  Subjective   No chest pain overnight Cardiac catheterization yesterday, results discussed with her Intervention to her circumflex vessel, 90% stenosis. 2 mm stent placed Bruising hematoma right hand and wrist, no significant pain Has not ambulated yet, family at the bedside  Inpatient Medications    Scheduled Meds: . aspirin EC  81 mg Oral Daily  . atorvastatin  40 mg Oral q1800  . carvedilol  3.125 mg Oral Daily  . clopidogrel  75 mg Oral Q breakfast  . fluticasone  2 spray Each Nare Daily  . folic acid  1 mg Oral Daily  . gabapentin  400 mg Oral QHS  . sodium chloride flush  3 mL Intravenous Q12H  . warfarin  3 mg Oral q1800  . Warfarin - Pharmacist Dosing Inpatient   Does not apply q1800   Continuous Infusions: . sodium chloride     PRN Meds: sodium chloride, ALPRAZolam, docusate sodium, nitroGLYCERIN, polyvinyl alcohol, sodium chloride flush   Vital Signs    Vitals:   05/09/17 2100 05/10/17 0432 05/10/17 0725 05/10/17 1255  BP: 140/63 (!) 106/48 126/67 136/61  Pulse: 61 (!) 50 (!) 53 64  Resp:   20 18  Temp: (!) 97.5 F (36.4 C) 97.8 F (36.6 C)  97.7 F (36.5 C)  TempSrc: Oral Oral  Oral  SpO2: 100% 92% 99% 98%  Weight:      Height:        Intake/Output Summary (Last 24 hours) at 05/10/17 1416 Last data filed at 05/10/17 1103  Gross per 24 hour  Intake           743.21 ml  Output             1480 ml  Net          -736.79 ml   Filed Weights   05/06/17 2222 05/09/17 0559 05/09/17 1308  Weight: 114 lb 12.8 oz (52.1 kg) 116 lb 6.4 oz (52.8 kg) 116 lb (52.6 kg)    Telemetry      ECG     Personally Reviewed Atrial fibrillation Physical Exam   GEN: No acute distress.   Neck: No JVD Cardiac: Irregularly irregular, no murmurs, rubs, or gallops.  Respiratory: Clear to auscultation bilaterally. GI: Soft, nontender, non-distended  MS: No  edema; No deformity., Ecchymosis right wris, t hand, minimal swelling Neuro:  Nonfocal  Psych: Normal affect   Labs    Chemistry Recent Labs Lab 05/06/17 1910 05/07/17 0646 05/08/17 0639 05/10/17 0336  NA 134* 136 136 135  K 4.4 4.1 4.0 5.4*  CL 103 104 105 106  CO2 25 26 24 23   GLUCOSE 116* 111* 113* 98  BUN 14 10 17 11   CREATININE 0.78 0.75 0.62 0.54  CALCIUM 9.9 8.7* 8.9 8.7*  PROT 6.3*  --   --   --   ALBUMIN 3.7  --   --   --   AST 63*  --   --   --   ALT 63*  --   --   --   ALKPHOS 90  --   --   --   BILITOT 0.9  --   --   --   GFRNONAA >60 >60 >60 >60  GFRAA >60 >60 >60 >60  ANIONGAP 6 6 7 6      Hematology Recent Labs Lab 05/08/17 0639 05/09/17 0128 05/10/17 0336  WBC 6.7 6.7  7.8  RBC 5.59* 5.21* 5.14  HGB 17.5* 16.6* 16.0  HCT 50.8* 47.9* 46.6  MCV 90.8 92.0 90.7  MCH 31.3 31.7 31.0  MCHC 34.4 34.5 34.2  RDW 13.6 13.6 13.4  PLT 154 178 152    Cardiac Enzymes Recent Labs Lab 05/06/17 1910 05/06/17 2258 05/07/17 0311 05/07/17 0646  TROPONINI 0.03* 0.16* 0.68* 0.97*   No results for input(s): TROPIPOC in the last 168 hours.   BNP Recent Labs Lab 05/06/17 1905  BNP 277.0*     DDimer No results for input(s): DDIMER in the last 168 hours.   Radiology    No results found.  Cardiac Studies  Cardiac catheterization showing 90% mid circumflex disease Status post stent placement   Patient Profile     83 y.o. female with a known history of Atrial fibrillation, coronary artery disease, previous stress test was negative for acute ischemia in March 2018, pacemaker placement, hyperlipidemia, hypertension, normal skin lymphoma- started having chest pain, cardiac catheterization yesterday with stent placement to her mid circumflex  Assessment & Plan    ---CAD, Unstable angina NSTEMI Stent placed to her mid circumflex by Dr. Fletcher Anon Did well in recovery with mild hematoma right wrist and hand She will stay on aspirin, Plavix, warfarin for the  next week I talked with Coumadin clinic, rescheduled Coumadin appointment for next Wednesday At that time she will stop aspirin and stay on warfarin and Plavix The above was discussed with the patient She'll follow-up in cardiology clinic in the next several weeks   Complete atrioventricular block ([place]) -  She has pacemaker, followed by EP   Chronic atrial fibrillation ([place]) -  We will restart warfarin as above Anticoagulation clinic next Wednesday one week from today  Pacemaker-dual-chamber-Medtronic - Followed by Jolyn Nap  Long discussion about recent events, disposition, recovery, discharge instructions provided Case discussed with hospitalist service and family  Total encounter time more than 35 minutes  Greater than 50% was spent in counseling and coordination of care with the patient   Signed, Ida Rogue, MD  05/10/2017, 2:16 PM

## 2017-05-10 NOTE — Progress Notes (Signed)
Pt ambulated around nurses station/ tolerated well/ no c/o pain or sob/ will continue to monitor

## 2017-05-10 NOTE — Discharge Summary (Signed)
[facility] NAME: [name]    MR#:  [id number]  DATE OF BIRTH:  [birth year]  DATE OF ADMISSION:  05/06/2017 ADMITTING PHYSICIAN: Vaughan Basta, MD  DATE OF DISCHARGE: 7/101/8  PRIMARY CARE PHYSICIAN: Adin Hector, MD    ADMISSION DIAGNOSIS:  Elevated troponin [R74.8] Chest pain, unspecified type [R07.9]  DISCHARGE DIAGNOSIS:  Acute NSTEMI s/p cath with stent in circumflex Afib-chronic on coumadin  SECONDARY DIAGNOSIS:   Past Medical History:  Diagnosis Date  . C. difficile colitis    feces transplant  . Coronary artery disease    a. NSTEMI 12/2016 => med Rx; b. Nuc 12/2016: low risk; c. NSTEMI 04/2017  . Diverticulitis   . GERD (gastroesophageal reflux disease)   . Hereditary hemochromatosis ([place])    Phlebotomized in the past, not getting phlebotomy now  . Hyperlipidemia   . Hypertension   . Non Hodgkin's lymphoma ([place])    stable; followed by an oncologist  . Osteoarthritis   . 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 Pacemaker-dual-chamber-Medtronic 04/04/2011  . Permanent atrial fibrillation ([place]) 07/20/2009   a. s/p AV nodal ablation; b. s/p MDT PPM; c. CHADS2VASc => 5 (HTN, age x 2, vascular disease, female)    HOSPITAL COURSE:  HelenSladeis a 83 y.o.femalewith a known history of Atrial fibrillation, coronary artery disease, no stent was done in February 2018 but her stress test was negative for acute ischemia in March 2018, pacemaker placement, hyperlipidemia, hypertension, normal skin lymphoma- started having chest pain which was central and pressure-like this morning.   *Acute mild non-Q-wave MI -Patient presented with on and off chest pain -Troponin 0.03--- 0.16--- 0.68--- 0.97 -She has history of coronary artery disease, continue aspirin, beta blockers. -Cardiology consult.---- Appreciated. - Cardiac catheterization  which showed severe one-vessel coronary artery disease with 99% stenosis in  the mid left circumflex. This was treated successfully with PCI and drug-eluting stent placement. -Patient now on aspirin and Plavix. -Resume Coumadin tonight.----Once INR is therapeutic discontinue aspirin to prevent bleeding--this will be done by Dr Donivan Scull office  * Hypertension Continue beta blockers.  * Chronic Atrial fibrillation status post AV nodal ablation Rate is under control, continue beta blocker,  * Anxiety Continue alprazolam as needed.  *mild hyperkalemia likely due to right cath site bleed Kayexalate and recheck K  CONSULTS OBTAINED:  Treatment Team:  Burnell Blanks, MD  DRUG ALLERGIES:   Allergies  Allergen Reactions  . Amoxicillin   . Cefdinir Diarrhea  . Cilostazol     Other reaction(s): Headache  . Levofloxacin   . Sulfur Nausea Only    DISCHARGE MEDICATIONS:   Current Discharge Medication List    START taking these medications   Details  atorvastatin (LIPITOR) 40 MG tablet Take 1 tablet (40 mg total) by mouth daily at 6 PM. Qty: 30 tablet, Refills: 1    clopidogrel (PLAVIX) 75 MG tablet Take 1 tablet (75 mg total) by mouth daily with breakfast. Qty: 30 tablet, Refills: 1      CONTINUE these medications which have CHANGED   Details  warfarin (COUMADIN) 3 MG tablet Take 1 tablet (3 mg total) by mouth daily at 6 PM. Qty: 15 tablet, Refills: 0      CONTINUE these medications which have NOT CHANGED   Details  ALPRAZolam (XANAX) 0.25 MG tablet Take 0.125 mg by mouth at bedtime.     aspirin EC 81 MG EC tablet Take 1 tablet (81 mg total) by mouth  daily.    carvedilol (COREG) 3.125 MG tablet Take 1 tablet (3.125 mg total) by mouth daily. Qty: 180 tablet, Refills: 3    cetirizine (ZYRTEC ALLERGY) 10 MG tablet Take 10 mg by mouth daily as needed.     fluticasone (FLONASE) 50 MCG/ACT nasal spray Place 2 sprays into the nose daily as needed for allergies.     folic acid (FOLVITE) 1 MG tablet Take 1 mg by mouth daily.       gabapentin (NEURONTIN) 400 MG capsule Take 400 mg by mouth at bedtime.     nitroGLYCERIN (NITROSTAT) 0.4 MG SL tablet Place 1 tablet (0.4 mg total) under the tongue every 5 (five) minutes as needed for chest pain. Qty: 25 tablet, Refills: 3    polyvinyl alcohol (LIQUIFILM TEARS) 1.4 % ophthalmic solution Place 2 drops into both eyes 3 (three) times daily as needed (for dry eyes).    vancomycin (VANCOCIN) 50 mg/mL oral solution Take 5 mLs (250 mg total) by mouth every 6 (six) hours. Qty: 240 mL, Refills: 0        If you experience worsening of your admission symptoms, develop shortness of breath, life threatening emergency, suicidal or homicidal thoughts you must seek medical attention immediately by calling 911 or calling your MD immediately  if symptoms less severe.  You Must read complete instructions/literature along with all the possible adverse reactions/side effects for all the Medicines you take and that have been prescribed to you. Take any new Medicines after you have completely understood and accept all the possible adverse reactions/side effects.   Please note  You were cared for by a hospitalist during your hospital stay. If you have any questions about your discharge medications or the care you received while you were in the hospital after you are discharged, you can call the unit and asked to speak with the hospitalist on call if the hospitalist that took care of you is not available. Once you are discharged, your primary care physician will handle any further medical issues. Please note that NO REFILLS for any discharge medications will be authorized once you are discharged, as it is imperative that you return to your primary care physician (or establish a relationship with a primary care physician if you do not have one) for your aftercare needs so that they can reassess your need for medications and monitor your lab values. Today   SUBJECTIVE   Doing well  VITAL SIGNS:   Blood pressure 126/67, pulse (!) 53, temperature 97.8 F (36.6 C), temperature source Oral, resp. rate 20, height 5\' 2"  (1.575 m), weight 52.6 kg (116 lb), SpO2 99 %.  I/O:   Intake/Output Summary (Last 24 hours) at 05/10/17 0913 Last data filed at 05/10/17 0206  Gross per 24 hour  Intake           503.21 ml  Output              800 ml  Net          -296.79 ml    PHYSICAL EXAMINATION:  GENERAL:  83 y.o.-year-old patient lying in the bed with no acute distress.  EYES: Pupils equal, round, reactive to light and accommodation. No scleral icterus. Extraocular muscles intact.  HEENT: Head atraumatic, normocephalic. Oropharynx and nasopharynx clear.  NECK:  Supple, no jugular venous distention. No thyroid enlargement, no tenderness.  LUNGS: Normal breath sounds bilaterally, no wheezing, rales,rhonchi or crepitation. No use of accessory muscles of respiration.  CARDIOVASCULAR: S1, S2 normal. No  murmurs, rubs, or gallops.  ABDOMEN: Soft, non-tender, non-distended. Bowel sounds present. No organomegaly or mass.  EXTREMITIES: No pedal edema, cyanosis, or clubbing. Right wrist bleed with mild hematoma NEUROLOGIC: Cranial nerves II through XII are intact. Muscle strength 5/5 in all extremities. Sensation intact. Gait not checked.  PSYCHIATRIC: The patient is alert and oriented x 3.  SKIN: No obvious rash, lesion, or ulcer.   DATA REVIEW:   CBC   Recent Labs Lab 05/10/17 0336  WBC 7.8  HGB 16.0  HCT 46.6  PLT 152    Chemistries   Recent Labs Lab 05/06/17 1910 05/07/17 0646  05/10/17 0336  NA 134* 136  < > 135  K 4.4 4.1  < > 5.4*  CL 103 104  < > 106  CO2 25 26  < > 23  GLUCOSE 116* 111*  < > 98  BUN 14 10  < > 11  CREATININE 0.78 0.75  < > 0.54  CALCIUM 9.9 8.7*  < > 8.7*  MG  --  2.2  --   --   AST 63*  --   --   --   ALT 63*  --   --   --   ALKPHOS 90  --   --   --   BILITOT 0.9  --   --   --   < > = values in this interval not displayed.  Microbiology Results    No results found for this or any previous visit (from the past 240 hour(s)).  RADIOLOGY:  No results found.   Management plans discussed with the patient, family and they are in agreement.  CODE STATUS:     Code Status Orders        Start     Ordered   05/06/17 2211  Do not attempt resuscitation (DNR)  Continuous    Question Answer Comment  In the event of cardiac or respiratory ARREST Do not call a "code blue"   In the event of cardiac or respiratory ARREST Do not perform Intubation, CPR, defibrillation or ACLS   In the event of cardiac or respiratory ARREST Use medication by any route, position, wound care, and other measures to relive pain and suffering. May use oxygen, suction and manual treatment of airway obstruction as needed for comfort.      05/06/17 2210    Code Status History    Date Active Date Inactive Code Status Order ID Comments User Context   12/27/2016  6:12 PM 12/31/2016  6:15 PM DNR [id number]  Epifanio Lesches, MD ED   08/28/2016  9:36 PM 09/01/2016  4:56 PM DNR [id number]  Loletha Grayer, MD ED    Advance Directive Documentation     Most Recent Value  Type of Advance Directive  Out of facility DNR (pink MOST or yellow form)  Pre-existing out of facility DNR order (yellow form or pink MOST form)  -- [inpatient order in chart]  "MOST" Form in Place?  -      TOTAL TIME TAKING CARE OF THIS PATIENT: 40 minutes.    Sashay Felling M.D on 05/10/2017 at 9:13 AM  Between 7am to 6pm - Pager - [phone] After 6pm go to [email or web address] - password [email or web address] [facility]  Office  [phone]  CC: Primary care physician; Adin Hector, MD

## 2017-05-11 ENCOUNTER — Encounter: Payer: Self-pay | Admitting: Internal Medicine

## 2017-05-11 NOTE — Telephone Encounter (Signed)
Patient contacted regarding discharge from [facility] on 05/10/2017.  Patient understands to follow up with provider Dr. Rockey Situ on 05/16/2017 at 2:00PM at [facility]. Patient understands discharge instructions? Yes Patient understands medications and regiment? Yes Patient understands to bring all medications to this visit? Yes  Patient states that she has not read all of her materials yet but she did verbalize understanding to call back if she should have any questions. She confirmed upcoming appointment and reports that home health nurse will be coming in to check on her as well. Let her know to give [place] a call if she should have any questions.

## 2017-05-12 ENCOUNTER — Telehealth: Payer: Self-pay | Admitting: *Deleted

## 2017-05-12 NOTE — Progress Notes (Signed)
Cardiology Office Note  Date:  05/16/2017   ID:  Joyce, Snyder Dec 20, 1927, MRN [id number]  PCP:  Joyce Hector, MD   Chief Complaint  Patient presents with  . other     Early f/u per tcm from ED 7/7 for chest pain/elevated troponin. LEFT HEART CATH AND CORONARY ANGIOGRAPHY 7/10. Pt c/ weakness; denies cp/sob. Reviewed meds with pt verbally.    HPI:   [name] is a 83 y.o. female with history of  permanent atrial fibrillation on warfarin  s/p AV node ablation an pacemaker placement,   hypertension,  hyperlipidemia,  recurrent C. difficile colitis,  non-hodgkin's lymphoma,  hereditary hemochromatosis, CLL pancreatic mass,  Who presented to the hospital 12/27/2016 with non-STEMI, Troponin up to 19 managed medically at the request of the patient , patient declined catheterization at the time Echocardiogram showed ejection fraction 50-55%, inferolateral wall hypokinesis who presents for follow-up in the office for her coronary artery disease, previous non-STEMI  Hospital 05/08/2017 with chest pain, elevated troponin 0.97 Cath 05/09/2017 Stent to LCX mid vessel Hospital records reviewed with the patient in detail On asa and plavix/warfarin  Right arm echymosis, whole forearm  EKG personally reviewed by myself on todays visit Shows paced rhythm rate 60 bpm underlying atrial fibrillation  stress test performed on 01/11/2017 Pharmacological myocardial perfusion imaging study with no significant  Ischemia Moderate sized region of fixed perfusion defect basal to mid lateral wall Lateral wall hypokinesis, EF estimated at 48%  Other past medical history reviewed CT abdomen/pelvis showed slight interval enlargement of pancreatic mass but otherwise no acute abnormalities.   Underwent Mohs procedure at [facility] 3 days ago   took 3 nitroglycerin last night for right posterior neck and shoulder pain Did not know if it was chest pain or musculoskeletal  She has not started any  exercise program or rehabilitation program Concerned that she may have more blockages, "Do I need a cardiac catheterization"   PMH:   has a past medical history of C. difficile colitis; Coronary artery disease; Diverticulitis; GERD (gastroesophageal reflux disease); Hereditary hemochromatosis ([place]); Hyperlipidemia; Hypertension; Non Hodgkin's lymphoma ([place]); Osteoarthritis; Pacemaker syndrome (01/24/2014); Pacemaker-dual-chamber-Medtronic (04/04/2011); and Permanent atrial fibrillation ([place]) (07/20/2009).  PSH:    Past Surgical History:  Procedure Laterality Date  . ABDOMINAL HYSTERECTOMY    . ATRIAL ABLATION SURGERY  2008   AV nodal ablation  . BREAST BIOPSY  benign  . CARDIAC CATHETERIZATION    . CARDIOVERSION    . CHOLECYSTECTOMY    . COLONOSCOPY  oct.12, 2012  . CORONARY STENT INTERVENTION N/A 05/09/2017   Procedure: Coronary Stent Intervention;  Surgeon: Wellington Hampshire, MD;  Location: [facility] LAB;  Service: Cardiovascular;  Laterality: N/A;  . INSERT / REPLACE / REMOVE PACEMAKER    . LEFT HEART CATH AND CORONARY ANGIOGRAPHY N/A 05/09/2017   Procedure: Left Heart Cath and Coronary Angiography;  Surgeon: Wellington Hampshire, MD;  Location: [facility] CV LAB;  Service: Cardiovascular;  Laterality: N/A;  . PACEMAKER GENERATOR CHANGE N/A 12/17/2012   Procedure: PACEMAKER GENERATOR CHANGE;  Surgeon: Deboraha Sprang, MD;  Location[facility] CATH LAB;  Service: Cardiovascular;  Laterality: N/A;  . stool transplant    . US ECHOCARDIOGRAPHY  07/2008   EF 55%, mild LVH. No significant valvular abnormalities    Current Outpatient Prescriptions  Medication Sig Dispense Refill  . ALPRAZolam (XANAX) 0.25 MG tablet Take 0.125 mg by mouth at bedtime.     [name] atorvastatin (LIPITOR) 40 MG  tablet Take 1 tablet (40 mg total) by mouth daily at 6 PM. 30 tablet 1  . carvedilol (COREG) 3.125 MG tablet Take 1 tablet (3.125 mg total) by mouth daily. 180 tablet 3  . cetirizine (ZYRTEC ALLERGY) 10 MG tablet  Take 10 mg by mouth daily as needed.     . clopidogrel (PLAVIX) 75 MG tablet Take 1 tablet (75 mg total) by mouth daily with breakfast. 30 tablet 1  . fluticasone (FLONASE) 50 MCG/ACT nasal spray Place 2 sprays into the nose daily as needed for allergies.     . folic acid (FOLVITE) 1 MG tablet Take 1 mg by mouth daily.      [name] gabapentin (NEURONTIN) 400 MG capsule Take 400 mg by mouth at bedtime.     . polyvinyl alcohol (LIQUIFILM TEARS) 1.4 % ophthalmic solution Place 2 drops into both eyes 3 (three) times daily as needed (for dry eyes).    . vancomycin (VANCOCIN) 50 mg/mL oral solution Take 5 mLs (250 mg total) by mouth every 6 (six) hours. (Patient taking differently: Take 250 mg by mouth every 6 (six) hours. Qid every 3 days) 240 mL 0  . warfarin (COUMADIN) 3 MG tablet Take 1 tablet (3 mg total) by mouth daily at 6 PM. 15 tablet 0  . nitroGLYCERIN (NITROSTAT) 0.4 MG SL tablet Place 1 tablet (0.4 mg total) under the tongue every 5 (five) minutes as needed for chest pain. 25 tablet 3   No current facility-administered medications for this visit.      Allergies:   Amoxicillin; Cefdinir; Cilostazol; Levofloxacin; and Sulfur   Social History:  The patient  reports that she quit smoking about 22 years ago. She has never used smokeless tobacco. She reports that she does not drink alcohol or use drugs.   Family History:   family history includes Cancer in her mother; Liver disease in her father.    Review of Systems: Review of Systems  Respiratory: Negative.   Cardiovascular: Negative.   Gastrointestinal: Negative.   Musculoskeletal: Negative.   Neurological: Positive for weakness.  Psychiatric/Behavioral: Negative.   All other systems reviewed and are negative.    PHYSICAL EXAM: VS:  Ht 5\' 2"  (1.575 m)   Wt 117 lb 4 oz (53.2 kg)   BMI 21.45 kg/m  , BMI Body mass index is 21.45 kg/m. GEN: Well nourished, well developed, in no acute distress  HEENT: normal ,  bandage on right side  of her nose  Neck: no JVD, carotid bruits, or masses Cardiac: RRR; no murmurs, rubs, or gallops,no edema  Respiratory:  clear to auscultation bilaterally, normal work of breathing GI: soft, nontender, nondistended, + BS MS: no deformity or atrophy  Skin: warm and dry, no rash, significant ecchymosis right forearm Neuro:  Strength and sensation are intact Psych: euthymic mood, full affect    Recent Labs: 08/29/2016: TSH 1.985 05/06/2017: ALT 63; B Natriuretic Peptide 277.0 05/07/2017: Magnesium 2.2 05/10/2017: BUN 11; Creatinine, Ser 0.54; Hemoglobin 16.0; Platelets 152; Potassium 4.1; Sodium 135    Lipid Panel Lab Results  Component Value Date   CHOL 203 (H) 05/06/2017   HDL 31 (L) 05/06/2017   LDLCALC 138 (H) 05/06/2017   TRIG 171 (H) 05/06/2017      Wt Readings from Last 3 Encounters:  05/16/17 117 lb 4 oz (53.2 kg)  05/09/17 116 lb (52.6 kg)  04/07/17 117 lb (53.1 kg)       ASSESSMENT AND PLAN:  Coronary artery disease involving native coronary artery  of native heart with unstable angina pectoris ([place]) -  Recent stent placement to her mid circumflex Recommended she stop aspirin, stay on Plavix and warfarin INR 2.1  NSTEMI (non-ST elevated myocardial infarction) ([place]) - Plan: EKG 12-Lead Previous details discussed with her She is weak, tired but slowly recovering from recent hospitalization and stent placement  Complete atrioventricular block ([place]) - Plan: EKG 12-Lead She has pacemaker, followed by EP   Chronic atrial fibrillation ([place]) - Plan: EKG 12-Lead Tolerating anticoagulation, on warfarin,  Plavix for new stent  Pacemaker-dual-chamber-Medtronic - Plan: EKG 12-Lead Followed by Jolyn Nap  Long term current use of anticoagulant - Plan: EKG 12-Lead On warfarin, and Plavix for new stent We will stop the aspirin   Total encounter time more than 40 minutes  Greater than 50% was spent in counseling and coordination of care with the  patient   Disposition:   F/U  6 months   Orders Placed This Encounter  Procedures  . EKG 12-Lead     Signed, Esmond Plants, M.D., Ph.D. 05/16/2017  [place], [place]

## 2017-05-12 NOTE — Telephone Encounter (Signed)
Pt stated she has been in the hospital & needed to have INR checked. Pt will be seeing Dr.Gollan on 05/16/17, therefore made an appt for the pt to have INR checked as well. Pt mentioned that [place] will be coming out today & that they will be checking an INR on her. Advised that if the come out, we can have INR checked & called in.  [place] she stated the Nurse will be going out today but the time was unknown. Advised that the Cardiology office closes at 1130 today therefore, we would need the results by 1115 today.  She stated that she would let the nurse know.

## 2017-05-15 ENCOUNTER — Ambulatory Visit (INDEPENDENT_AMBULATORY_CARE_PROVIDER_SITE_OTHER): Payer: Medicare Other | Admitting: Cardiology

## 2017-05-15 DIAGNOSIS — I482 Chronic atrial fibrillation, unspecified: Secondary | ICD-10-CM

## 2017-05-15 DIAGNOSIS — Z5181 Encounter for therapeutic drug level monitoring: Secondary | ICD-10-CM

## 2017-05-15 LAB — POCT INR: INR: 2.1

## 2017-05-16 ENCOUNTER — Encounter: Payer: Self-pay | Admitting: Cardiovascular Disease

## 2017-05-16 ENCOUNTER — Ambulatory Visit (INDEPENDENT_AMBULATORY_CARE_PROVIDER_SITE_OTHER): Payer: Medicare Other | Admitting: Cardiovascular Disease

## 2017-05-16 VITALS — Ht 62.0 in | Wt 117.2 lb

## 2017-05-16 DIAGNOSIS — E782 Mixed hyperlipidemia: Secondary | ICD-10-CM

## 2017-05-16 DIAGNOSIS — Z7189 Other specified counseling: Secondary | ICD-10-CM | POA: Diagnosis not present

## 2017-05-16 DIAGNOSIS — I25110 Atherosclerotic heart disease of native coronary artery with unstable angina pectoris: Secondary | ICD-10-CM

## 2017-05-16 DIAGNOSIS — I214 Non-ST elevation (NSTEMI) myocardial infarction: Secondary | ICD-10-CM | POA: Diagnosis not present

## 2017-05-16 DIAGNOSIS — I442 Atrioventricular block, complete: Secondary | ICD-10-CM

## 2017-05-16 DIAGNOSIS — I10 Essential (primary) hypertension: Secondary | ICD-10-CM

## 2017-05-16 DIAGNOSIS — J449 Chronic obstructive pulmonary disease, unspecified: Secondary | ICD-10-CM | POA: Diagnosis not present

## 2017-05-16 NOTE — Patient Instructions (Addendum)
Medication Instructions:   Please stop the aspirin Stay on warfarin and plavix   Labwork:  No new labs needed  Testing/Procedures:  No further testing at this time   Follow-Up: It was a pleasure seeing you in the office today. Please call us if you have new issues that need to be addressed before your next appt.  [phone]  Your physician wants you to follow-up in: 6 months.  You will receive a reminder letter in the mail two months in advance. If you don't receive a letter, please call our office to schedule the follow-up appointment.  If you need a refill on your cardiac medications before your next appointment, please call your pharmacy.

## 2017-05-22 ENCOUNTER — Ambulatory Visit (INDEPENDENT_AMBULATORY_CARE_PROVIDER_SITE_OTHER): Payer: Medicare Other

## 2017-05-22 DIAGNOSIS — Z5181 Encounter for therapeutic drug level monitoring: Secondary | ICD-10-CM

## 2017-05-22 DIAGNOSIS — I482 Chronic atrial fibrillation, unspecified: Secondary | ICD-10-CM

## 2017-05-22 LAB — POCT INR: INR: 5.5

## 2017-05-29 ENCOUNTER — Ambulatory Visit (INDEPENDENT_AMBULATORY_CARE_PROVIDER_SITE_OTHER): Payer: Self-pay | Admitting: Internal Medicine

## 2017-05-29 DIAGNOSIS — Z5181 Encounter for therapeutic drug level monitoring: Secondary | ICD-10-CM

## 2017-05-29 DIAGNOSIS — I482 Chronic atrial fibrillation, unspecified: Secondary | ICD-10-CM

## 2017-05-29 LAB — POCT INR: INR: 1.6

## 2017-06-05 ENCOUNTER — Ambulatory Visit (INDEPENDENT_AMBULATORY_CARE_PROVIDER_SITE_OTHER): Payer: Self-pay

## 2017-06-05 DIAGNOSIS — Z5181 Encounter for therapeutic drug level monitoring: Secondary | ICD-10-CM

## 2017-06-05 DIAGNOSIS — I482 Chronic atrial fibrillation, unspecified: Secondary | ICD-10-CM

## 2017-06-05 LAB — POCT INR: INR: 2.6

## 2017-06-12 ENCOUNTER — Ambulatory Visit (INDEPENDENT_AMBULATORY_CARE_PROVIDER_SITE_OTHER): Payer: Medicare Other | Admitting: Interventional Cardiology

## 2017-06-12 DIAGNOSIS — Z5181 Encounter for therapeutic drug level monitoring: Secondary | ICD-10-CM

## 2017-06-12 DIAGNOSIS — I482 Chronic atrial fibrillation, unspecified: Secondary | ICD-10-CM

## 2017-06-12 LAB — POCT INR: INR: 1.8

## 2017-06-13 ENCOUNTER — Ambulatory Visit (INDEPENDENT_AMBULATORY_CARE_PROVIDER_SITE_OTHER): Payer: Medicare Other | Admitting: *Deleted

## 2017-06-13 DIAGNOSIS — I442 Atrioventricular block, complete: Secondary | ICD-10-CM | POA: Diagnosis not present

## 2017-06-13 DIAGNOSIS — I482 Chronic atrial fibrillation, unspecified: Secondary | ICD-10-CM

## 2017-06-14 LAB — CUP PACEART REMOTE DEVICE CHECK
Battery Remaining Longevity: 102 mo
Battery Voltage: 2.79 V
Brady Statistic RV Percent Paced: 100 %
Implantable Lead Implant Date: 20000718
Implantable Lead Location: 753860
Lead Channel Impedance Value: 67 Ohm
Lead Channel Setting Pacing Amplitude: 2.5 V
Lead Channel Setting Pacing Pulse Width: 0.4 ms
Lead Channel Setting Sensing Sensitivity: 5.6 mV
MDC IDC LEAD IMPLANT DT: 20000718
MDC IDC LEAD LOCATION: 753859
MDC IDC MSMT BATTERY IMPEDANCE: 403 Ohm
MDC IDC MSMT LEADCHNL RV IMPEDANCE VALUE: 539 Ohm
MDC IDC PG IMPLANT DT: 20140217
MDC IDC SESS DTM: 20180814152313

## 2017-06-14 NOTE — Progress Notes (Signed)
Remote pacemaker check. 

## 2017-06-21 ENCOUNTER — Ambulatory Visit (INDEPENDENT_AMBULATORY_CARE_PROVIDER_SITE_OTHER): Payer: Medicare Other | Admitting: *Deleted

## 2017-06-21 ENCOUNTER — Telehealth: Payer: Self-pay | Admitting: Cardiovascular Disease

## 2017-06-21 DIAGNOSIS — I482 Chronic atrial fibrillation, unspecified: Secondary | ICD-10-CM

## 2017-06-21 DIAGNOSIS — Z5181 Encounter for therapeutic drug level monitoring: Secondary | ICD-10-CM

## 2017-06-21 DIAGNOSIS — I25110 Atherosclerotic heart disease of native coronary artery with unstable angina pectoris: Secondary | ICD-10-CM

## 2017-06-21 LAB — POCT INR: INR: 3.5

## 2017-06-21 NOTE — Telephone Encounter (Signed)
Pt calling asking if we can check her INR  She states the home health is coming in the morning but would rather [place] do it here in office  Please advise.

## 2017-06-21 NOTE — Telephone Encounter (Signed)
Spoke w/ pt.  She reports that [facility] was supposed to come check her INR, but the did not have her on their schedule.  She is due and would like to come over today for coumadin check. Sched her to come over today @ 2:35. She is appreciative of the call.

## 2017-06-23 ENCOUNTER — Encounter: Payer: Self-pay | Admitting: Pharmacist

## 2017-06-27 ENCOUNTER — Encounter: Payer: Self-pay | Admitting: Cardiology

## 2017-07-05 ENCOUNTER — Ambulatory Visit (INDEPENDENT_AMBULATORY_CARE_PROVIDER_SITE_OTHER): Payer: Medicare Other

## 2017-07-05 DIAGNOSIS — I482 Chronic atrial fibrillation, unspecified: Secondary | ICD-10-CM

## 2017-07-05 DIAGNOSIS — Z5181 Encounter for therapeutic drug level monitoring: Secondary | ICD-10-CM | POA: Diagnosis not present

## 2017-07-05 LAB — POCT INR: INR: 3.1

## 2017-07-19 ENCOUNTER — Ambulatory Visit (INDEPENDENT_AMBULATORY_CARE_PROVIDER_SITE_OTHER): Payer: Medicare Other

## 2017-07-19 DIAGNOSIS — I482 Chronic atrial fibrillation, unspecified: Secondary | ICD-10-CM

## 2017-07-19 DIAGNOSIS — Z5181 Encounter for therapeutic drug level monitoring: Secondary | ICD-10-CM

## 2017-07-19 LAB — POCT INR: INR: 2.7

## 2017-08-16 ENCOUNTER — Ambulatory Visit (INDEPENDENT_AMBULATORY_CARE_PROVIDER_SITE_OTHER): Payer: Medicare Other

## 2017-08-16 DIAGNOSIS — Z5181 Encounter for therapeutic drug level monitoring: Secondary | ICD-10-CM | POA: Diagnosis not present

## 2017-08-16 DIAGNOSIS — I482 Chronic atrial fibrillation, unspecified: Secondary | ICD-10-CM

## 2017-08-16 LAB — POCT INR: INR: 1.9

## 2017-08-23 ENCOUNTER — Other Ambulatory Visit: Payer: Self-pay | Admitting: Family Medicine

## 2017-08-23 DIAGNOSIS — M5441 Lumbago with sciatica, right side: Secondary | ICD-10-CM

## 2017-08-25 ENCOUNTER — Ambulatory Visit
Admission: RE | Admit: 2017-08-25 | Discharge: 2017-08-25 | Disposition: A | Discharge status: Home / Self Care | Payer: Medicare Other | Source: Ambulatory Visit | Attending: Family Medicine | Admitting: Family Medicine

## 2017-08-25 DIAGNOSIS — Z87311 Personal history of (healed) other pathological fracture: Secondary | ICD-10-CM | POA: Insufficient documentation

## 2017-08-25 DIAGNOSIS — M4856XA Collapsed vertebra, not elsewhere classified, lumbar region, initial encounter for fracture: Secondary | ICD-10-CM | POA: Diagnosis not present

## 2017-08-25 DIAGNOSIS — M5441 Lumbago with sciatica, right side: Secondary | ICD-10-CM | POA: Diagnosis not present

## 2017-08-25 DIAGNOSIS — M48061 Spinal stenosis, lumbar region without neurogenic claudication: Secondary | ICD-10-CM | POA: Diagnosis not present

## 2017-08-28 ENCOUNTER — Other Ambulatory Visit: Payer: Self-pay | Admitting: Orthopedic Surgery

## 2017-08-28 DIAGNOSIS — S32010A Wedge compression fracture of first lumbar vertebra, initial encounter for closed fracture: Secondary | ICD-10-CM

## 2017-08-29 ENCOUNTER — Ambulatory Visit: Payer: Medicare Other

## 2017-08-29 ENCOUNTER — Encounter: Admission: RE | Payer: Self-pay | Source: Ambulatory Visit

## 2017-08-29 ENCOUNTER — Ambulatory Visit: Admission: RE | Admit: 2017-08-29 | Payer: Medicare Other | Source: Ambulatory Visit | Admitting: Orthopedic Surgery

## 2017-08-29 SURGERY — KYPHOPLASTY
Anesthesia: Choice

## 2017-08-30 ENCOUNTER — Encounter
Admission: RE | Admit: 2017-08-30 | Discharge: 2017-08-30 | Disposition: A | Discharge status: Home / Self Care | Payer: Medicare Other | Source: Ambulatory Visit | Attending: Orthopedic Surgery | Admitting: Orthopedic Surgery

## 2017-08-30 ENCOUNTER — Ambulatory Visit
Admission: RE | Admit: 2017-08-30 | Discharge: 2017-08-30 | Disposition: A | Discharge status: Home / Self Care | Payer: Medicare Other | Source: Ambulatory Visit | Attending: Orthopedic Surgery | Admitting: Orthopedic Surgery

## 2017-08-30 DIAGNOSIS — S32010A Wedge compression fracture of first lumbar vertebra, initial encounter for closed fracture: Secondary | ICD-10-CM | POA: Diagnosis present

## 2017-08-30 DIAGNOSIS — X58XXXA Exposure to other specified factors, initial encounter: Secondary | ICD-10-CM | POA: Diagnosis not present

## 2017-08-30 MED ORDER — TECHNETIUM TC 99M MEDRONATE IV KIT
25.0000 | PACK | Freq: Once | INTRAVENOUS | Status: AC | PRN
  Administered 2017-08-30: 22.772 via INTRAVENOUS

## 2017-08-31 ENCOUNTER — Ambulatory Visit: Payer: Medicare Other | Admitting: Certified Registered"

## 2017-08-31 ENCOUNTER — Ambulatory Visit: Payer: Medicare Other

## 2017-08-31 ENCOUNTER — Encounter: Payer: Self-pay | Admitting: *Deleted

## 2017-08-31 ENCOUNTER — Ambulatory Visit
Admission: RE | Admit: 2017-08-31 | Discharge: 2017-08-31 | Disposition: A | Discharge status: Home / Self Care | Payer: Medicare Other | Source: Ambulatory Visit | Attending: Orthopedic Surgery | Admitting: Orthopedic Surgery

## 2017-08-31 ENCOUNTER — Encounter
Admission: RE | Disposition: A | Discharge status: Home / Self Care | Payer: Self-pay | Source: Ambulatory Visit | Attending: Orthopedic Surgery

## 2017-08-31 DIAGNOSIS — E7800 Pure hypercholesterolemia, unspecified: Secondary | ICD-10-CM | POA: Diagnosis not present

## 2017-08-31 DIAGNOSIS — Z888 Allergy status to other drugs, medicaments and biological substances status: Secondary | ICD-10-CM | POA: Insufficient documentation

## 2017-08-31 DIAGNOSIS — I4891 Unspecified atrial fibrillation: Secondary | ICD-10-CM | POA: Insufficient documentation

## 2017-08-31 DIAGNOSIS — C9110 Chronic lymphocytic leukemia of B-cell type not having achieved remission: Secondary | ICD-10-CM | POA: Insufficient documentation

## 2017-08-31 DIAGNOSIS — M48061 Spinal stenosis, lumbar region without neurogenic claudication: Secondary | ICD-10-CM | POA: Insufficient documentation

## 2017-08-31 DIAGNOSIS — K219 Gastro-esophageal reflux disease without esophagitis: Secondary | ICD-10-CM | POA: Diagnosis not present

## 2017-08-31 DIAGNOSIS — Z86010 Personal history of colonic polyps: Secondary | ICD-10-CM | POA: Insufficient documentation

## 2017-08-31 DIAGNOSIS — I10 Essential (primary) hypertension: Secondary | ICD-10-CM | POA: Diagnosis not present

## 2017-08-31 DIAGNOSIS — M109 Gout, unspecified: Secondary | ICD-10-CM | POA: Diagnosis not present

## 2017-08-31 DIAGNOSIS — J45909 Unspecified asthma, uncomplicated: Secondary | ICD-10-CM | POA: Insufficient documentation

## 2017-08-31 DIAGNOSIS — M4856XA Collapsed vertebra, not elsewhere classified, lumbar region, initial encounter for fracture: Secondary | ICD-10-CM | POA: Diagnosis present

## 2017-08-31 DIAGNOSIS — I252 Old myocardial infarction: Secondary | ICD-10-CM | POA: Diagnosis not present

## 2017-08-31 DIAGNOSIS — Z8719 Personal history of other diseases of the digestive system: Secondary | ICD-10-CM | POA: Diagnosis not present

## 2017-08-31 DIAGNOSIS — M8448XA Pathological fracture, other site, initial encounter for fracture: Secondary | ICD-10-CM | POA: Insufficient documentation

## 2017-08-31 DIAGNOSIS — Z87891 Personal history of nicotine dependence: Secondary | ICD-10-CM | POA: Diagnosis not present

## 2017-08-31 DIAGNOSIS — C8590 Non-Hodgkin lymphoma, unspecified, unspecified site: Secondary | ICD-10-CM | POA: Diagnosis not present

## 2017-08-31 DIAGNOSIS — D472 Monoclonal gammopathy: Secondary | ICD-10-CM | POA: Insufficient documentation

## 2017-08-31 DIAGNOSIS — Z90710 Acquired absence of both cervix and uterus: Secondary | ICD-10-CM | POA: Diagnosis not present

## 2017-08-31 DIAGNOSIS — G2581 Restless legs syndrome: Secondary | ICD-10-CM | POA: Insufficient documentation

## 2017-08-31 DIAGNOSIS — E559 Vitamin D deficiency, unspecified: Secondary | ICD-10-CM | POA: Diagnosis not present

## 2017-08-31 DIAGNOSIS — Z7901 Long term (current) use of anticoagulants: Secondary | ICD-10-CM | POA: Insufficient documentation

## 2017-08-31 DIAGNOSIS — Z955 Presence of coronary angioplasty implant and graft: Secondary | ICD-10-CM | POA: Insufficient documentation

## 2017-08-31 DIAGNOSIS — Z881 Allergy status to other antibiotic agents status: Secondary | ICD-10-CM | POA: Insufficient documentation

## 2017-08-31 DIAGNOSIS — I2510 Atherosclerotic heart disease of native coronary artery without angina pectoris: Secondary | ICD-10-CM | POA: Diagnosis not present

## 2017-08-31 DIAGNOSIS — Z79899 Other long term (current) drug therapy: Secondary | ICD-10-CM | POA: Diagnosis not present

## 2017-08-31 DIAGNOSIS — Z882 Allergy status to sulfonamides status: Secondary | ICD-10-CM | POA: Insufficient documentation

## 2017-08-31 DIAGNOSIS — M1990 Unspecified osteoarthritis, unspecified site: Secondary | ICD-10-CM | POA: Diagnosis not present

## 2017-08-31 DIAGNOSIS — Z419 Encounter for procedure for purposes other than remedying health state, unspecified: Secondary | ICD-10-CM

## 2017-08-31 DIAGNOSIS — Z950 Presence of cardiac pacemaker: Secondary | ICD-10-CM | POA: Insufficient documentation

## 2017-08-31 DIAGNOSIS — Z880 Allergy status to penicillin: Secondary | ICD-10-CM | POA: Insufficient documentation

## 2017-08-31 DIAGNOSIS — Z7902 Long term (current) use of antithrombotics/antiplatelets: Secondary | ICD-10-CM | POA: Diagnosis not present

## 2017-08-31 HISTORY — PX: KYPHOPLASTY: SHX5884

## 2017-08-31 LAB — PROTIME-INR
INR: 1.09
Prothrombin Time: 14 seconds (ref 11.4–15.2)

## 2017-08-31 SURGERY — KYPHOPLASTY
Anesthesia: General | Site: Back | Wound class: Clean

## 2017-08-31 MED ORDER — METOCLOPRAMIDE HCL 10 MG PO TABS: 5.0000 mg | ORAL_TABLET | Freq: Three times a day (TID) | ORAL | Status: DC | PRN

## 2017-08-31 MED ORDER — LIDOCAINE 2% (20 MG/ML) 5 ML SYRINGE
INTRAMUSCULAR | Status: DC | PRN
  Administered 2017-08-31: 25 mg via INTRAVENOUS

## 2017-08-31 MED ORDER — FENTANYL CITRATE (PF) 100 MCG/2ML IJ SOLN: 25.0000 ug | INTRAMUSCULAR | Status: DC | PRN

## 2017-08-31 MED ORDER — BUPIVACAINE-EPINEPHRINE (PF) 0.5% -1:200000 IJ SOLN
INTRAMUSCULAR | Status: AC
  Filled 2017-08-31: qty 30

## 2017-08-31 MED ORDER — PROPOFOL 10 MG/ML IV BOLUS
INTRAVENOUS | Status: DC | PRN
  Administered 2017-08-31: 20 mg via INTRAVENOUS

## 2017-08-31 MED ORDER — FAMOTIDINE 20 MG PO TABS
ORAL_TABLET | ORAL | Status: AC
  Administered 2017-08-31: 20 mg via ORAL
  Filled 2017-08-31: qty 1

## 2017-08-31 MED ORDER — CLINDAMYCIN PHOSPHATE 900 MG/50ML IV SOLN
INTRAVENOUS | Status: AC
  Filled 2017-08-31: qty 50

## 2017-08-31 MED ORDER — LIDOCAINE HCL 1 % IJ SOLN
INTRAMUSCULAR | Status: DC | PRN
  Administered 2017-08-31: 25 mL

## 2017-08-31 MED ORDER — LIDOCAINE HCL (PF) 1 % IJ SOLN
INTRAMUSCULAR | Status: AC
  Filled 2017-08-31: qty 30

## 2017-08-31 MED ORDER — HYDROCODONE-ACETAMINOPHEN 5-325 MG PO TABS
ORAL_TABLET | ORAL | Status: AC
  Administered 2017-08-31: 1 via ORAL
  Filled 2017-08-31: qty 1

## 2017-08-31 MED ORDER — METOCLOPRAMIDE HCL 5 MG/ML IJ SOLN: 5.0000 mg | Freq: Three times a day (TID) | INTRAMUSCULAR | Status: DC | PRN

## 2017-08-31 MED ORDER — IOPAMIDOL (ISOVUE-M 200) INJECTION 41%
INTRAMUSCULAR | Status: AC
  Filled 2017-08-31: qty 20

## 2017-08-31 MED ORDER — LIDOCAINE HCL (PF) 1 % IJ SOLN
INTRAMUSCULAR | Status: AC
  Filled 2017-08-31: qty 60

## 2017-08-31 MED ORDER — LACTATED RINGERS IV SOLN
INTRAVENOUS | Status: DC
  Administered 2017-08-31: 13:00:00 via INTRAVENOUS

## 2017-08-31 MED ORDER — SODIUM CHLORIDE 0.9 % IV SOLN: INTRAVENOUS | Status: DC

## 2017-08-31 MED ORDER — FENTANYL CITRATE (PF) 100 MCG/2ML IJ SOLN
INTRAMUSCULAR | Status: DC | PRN
  Administered 2017-08-31 (×4): 25 ug via INTRAVENOUS

## 2017-08-31 MED ORDER — BUPIVACAINE-EPINEPHRINE (PF) 0.5% -1:200000 IJ SOLN
INTRAMUSCULAR | Status: DC | PRN
  Administered 2017-08-31: 15 mL

## 2017-08-31 MED ORDER — ONDANSETRON HCL 4 MG/2ML IJ SOLN: 4.0000 mg | Freq: Four times a day (QID) | INTRAMUSCULAR | Status: DC | PRN

## 2017-08-31 MED ORDER — FAMOTIDINE 20 MG PO TABS
20.0000 mg | ORAL_TABLET | Freq: Once | ORAL | Status: AC
  Administered 2017-08-31: 20 mg via ORAL

## 2017-08-31 MED ORDER — ONDANSETRON HCL 4 MG/2ML IJ SOLN: 4.0000 mg | Freq: Once | INTRAMUSCULAR | Status: DC | PRN

## 2017-08-31 MED ORDER — PROPOFOL 500 MG/50ML IV EMUL
INTRAVENOUS | Status: DC | PRN
  Administered 2017-08-31: 25 ug/kg/min via INTRAVENOUS

## 2017-08-31 MED ORDER — HYDROCODONE-ACETAMINOPHEN 5-325 MG PO TABS
1.0000 | ORAL_TABLET | ORAL | Status: DC | PRN
  Administered 2017-08-31: 1 via ORAL

## 2017-08-31 MED ORDER — ONDANSETRON HCL 4 MG PO TABS: 4.0000 mg | ORAL_TABLET | Freq: Four times a day (QID) | ORAL | Status: DC | PRN

## 2017-08-31 MED ORDER — CLINDAMYCIN PHOSPHATE 900 MG/50ML IV SOLN
900.0000 mg | Freq: Once | INTRAVENOUS | Status: AC
  Administered 2017-08-31: 900 mg via INTRAVENOUS

## 2017-08-31 MED ORDER — KETAMINE HCL 50 MG/ML IJ SOLN
INTRAMUSCULAR | Status: DC | PRN
  Administered 2017-08-31: 10 mg via INTRAVENOUS
  Administered 2017-08-31: 15 mg via INTRAMUSCULAR

## 2017-08-31 SURGICAL SUPPLY — 17 items
ADH SKN CLS APL DERMABOND .7 (GAUZE/BANDAGES/DRESSINGS) ×1
CEMENT KYPHON CX01A KIT/MIXER (Cement) ×3 IMPLANT
DERMABOND ADVANCED (GAUZE/BANDAGES/DRESSINGS) ×2
DERMABOND ADVANCED .7 DNX12 (GAUZE/BANDAGES/DRESSINGS) ×1 IMPLANT
DEVICE BIOPSY BONE KYPHX (INSTRUMENTS) ×3 IMPLANT
DRAPE C-ARM XRAY 36X54 (DRAPES) ×3 IMPLANT
DURAPREP 26ML APPLICATOR (WOUND CARE) ×3 IMPLANT
GLOVE SURG SYN 9.0  PF PI (GLOVE) ×2
GLOVE SURG SYN 9.0 PF PI (GLOVE) ×1 IMPLANT
GOWN SRG 2XL LVL 4 RGLN SLV (GOWNS) ×1 IMPLANT
GOWN STRL NON-REIN 2XL LVL4 (GOWNS) ×3
GOWN STRL REUS W/ TWL LRG LVL3 (GOWN DISPOSABLE) ×1 IMPLANT
GOWN STRL REUS W/TWL LRG LVL3 (GOWN DISPOSABLE) ×3
PACK KYPHOPLASTY (MISCELLANEOUS) ×3 IMPLANT
STRAP SAFETY BODY (MISCELLANEOUS) ×3 IMPLANT
TRAY KYPHOPAK 15/3 EXPRESS 1ST (MISCELLANEOUS) ×1 IMPLANT
TRAY KYPHOPAK 20/3 EXPRESS 1ST (MISCELLANEOUS) ×3 IMPLANT

## 2017-08-31 NOTE — Anesthesia Preprocedure Evaluation (Signed)
Anesthesia Evaluation  Patient identified by MRN, date of birth, ID band Patient awake    Reviewed: Allergy & Precautions, H&P , NPO status , Patient's Chart, lab work & pertinent test results, reviewed documented beta blocker date and time   Airway Mallampati: II   Neck ROM: full    Dental  (+) Poor Dentition, Teeth Intact   Pulmonary neg pulmonary ROS, asthma , former smoker,    Pulmonary exam normal        Cardiovascular Exercise Tolerance: Poor hypertension, On Medications + angina with exertion + CAD and + Past MI  negative cardio ROS Normal cardiovascular exam+ dysrhythmias + pacemaker  Rhythm:regular Rate:Normal     Neuro/Psych  Neuromuscular disease negative neurological ROS  negative psych ROS   GI/Hepatic negative GI ROS, Neg liver ROS, GERD  Medicated,  Endo/Other  negative endocrine ROS  Renal/GU negative Renal ROS  negative genitourinary   Musculoskeletal   Abdominal   Peds  Hematology negative hematology ROS (+)   Anesthesia Other Findings Past Medical History: No date: C. difficile colitis     Comment:  feces transplant No date: Coronary artery disease     Comment:  a. NSTEMI 12/2016 => med Rx; b. Nuc 12/2016: low risk; c.               NSTEMI 04/2017 No date: Diverticulitis No date: GERD (gastroesophageal reflux disease) No date: Hereditary hemochromatosis ([place])     Comment:  Phlebotomized in the past, not getting phlebotomy now No date: Hyperlipidemia No date: Hypertension No date: Non Hodgkin's lymphoma ([place])     Comment:  stable; followed by an oncologist No date: Osteoarthritis 01/24/2014: Pacemaker syndrome 04/04/2011: Pacemaker-dual-chamber-Medtronic 07/20/2009: Permanent atrial fibrillation ([facility])     Comment:  a. s/p AV nodal ablation; b. s/p MDT PPM; c. CHADS2VASc               => 5 (HTN, age x 2, vascular disease, female) Past Surgical History: No date: ABDOMINAL HYSTERECTOMY 2008:  ATRIAL ABLATION SURGERY     Comment:  AV nodal ablation benign: BREAST BIOPSY No date: CARDIAC CATHETERIZATION No date: CARDIOVERSION No date: CHOLECYSTECTOMY oct.12, 2012: COLONOSCOPY 05/09/2017: CORONARY STENT INTERVENTION; N/A     Comment:  Procedure: Coronary Stent Intervention;  Surgeon: Wellington Hampshire, MD;  Location: [facility] CV LAB;                Service: Cardiovascular;  Laterality: N/A; No date: INSERT / REPLACE / REMOVE PACEMAKER 05/09/2017: LEFT HEART CATH AND CORONARY ANGIOGRAPHY; N/A     Comment:  Procedure: Left Heart Cath and Coronary Angiography;                Surgeon: Wellington Hampshire, MD;  Location: [facility]               CV LAB;  Service: Cardiovascular;  Laterality: N/A; 12/17/2012: PACEMAKER GENERATOR CHANGE; N/A     Comment:  Procedure: PACEMAKER GENERATOR CHANGE;  Surgeon: Deboraha Sprang, MD;  Location: [facility] CATH LAB;  Service:               Cardiovascular;  Laterality: N/A; No date: stool transplant 07/2008: US ECHOCARDIOGRAPHY     Comment:  EF 55%, mild LVH. No significant valvular abnormalities BMI    Body Mass Index:  21.40 kg/m  Reproductive/Obstetrics negative OB ROS                             Anesthesia Physical Anesthesia Plan  ASA: IV  Anesthesia Plan: General   Post-op Pain Management:    Induction:   PONV Risk Score and Plan: 4 or greater and Ondansetron, Dexamethasone, Midazolam and Propofol infusion  Airway Management Planned:   Additional Equipment:   Intra-op Plan:   Post-operative Plan:   Informed Consent: I have reviewed the patients History and Physical, chart, labs and discussed the procedure including the risks, benefits and alternatives for the proposed anesthesia with the patient or authorized representative who has indicated his/her understanding and acceptance.   Dental Advisory Given  Plan Discussed with: CRNA  Anesthesia Plan Comments:          Anesthesia Quick Evaluation

## 2017-08-31 NOTE — Op Note (Signed)
08/31/2017  2:46 PM  PATIENT:  [name]  83 y.o. female  PRE-OPERATIVE DIAGNOSIS:  COMPRESSION FRACTURE L1  POST-OPERATIVE DIAGNOSIS:  COMPRESSION FRACTURE L1  PROCEDURE:  Procedure(s): KYPHOPLASTY-L1 (N/A)  SURGEON: Laurene Footman, MD  ASSISTANTS: None  ANESTHESIA:   local and MAC  EBL:  No intake/output data recorded.  BLOOD ADMINISTERED:none  DRAINS: none   LOCAL MEDICATIONS USED:  MARCAINE    and XYLOCAINE   SPECIMEN:  Source of Specimen:  L1 vertebral body  DISPOSITION OF SPECIMEN:  PATHOLOGY  COUNTS:  YES  TOURNIQUET:  * No tourniquets in log *  IMPLANTS: Bone cement  DICTATION: .Dragon Dictation  Patient brought the operating room and after adequate sedation was obtained the patient was placed prone and C-arm brought in with good visualization of L1on the C-arm. After appropriate patient identification and timeout procedure local anesthetic was infiltratedon the right at L1. The back was then prepped and draped in sterile fashion and repeat timeout procedure carried out. Spinal needle was used to get local asthenic down to the pedicle on the right at L1. Small incision was then made and trocar advanced into the vertebral body and an extra pedicular fashion taking frequent C-arm views to make sure the neural foramen and spinal canal were not entered. Specimen was not obtained during biopsy part of the procedure followed by drilling carried out followed by placement of balloon inflation ofL1balloon to 4.0cc . Next the cement was mixed and was appropriate consistency it was used to fill the vertebral bodies with about 5 cc into L1 with good fill and interdigitation. After the cement was set the trochar wasremoved and permanent C-arm views showed adequate position of the cement with good fill superior to inferior endplates medial and lateral. The wounds are closed with Dermabond followed by Band-Aids  PLAN OF CARE: Discharge to home after PACU  PATIENT  DISPOSITION:  PACU - hemodynamically stable.

## 2017-08-31 NOTE — Anesthesia Post-op Follow-up Note (Signed)
Anesthesia QCDR form completed.        

## 2017-08-31 NOTE — Transfer of Care (Signed)
Immediate Anesthesia Transfer of Care Note  Patient: [name]  Procedure(s) Performed: [name] (N/A Back)  Patient Location: PACU  Anesthesia Type:General  Level of Consciousness: sedated  Airway & Oxygen Therapy: Patient Spontanous Breathing  Post-op Assessment: Report given to RN and Post -op Vital signs reviewed and stable  Post vital signs: Reviewed  Last Vitals:  Vitals:   08/31/17 1254 08/31/17 1447  BP: 137/69 140/60  Pulse: 63 (!) 59  Resp: 18 14  Temp: 36.8 C   SpO2: 99% 96%    Last Pain:  Vitals:   08/31/17 1254  TempSrc: Oral  PainSc: 10-Worst pain ever         Complications: No apparent anesthesia complications

## 2017-08-31 NOTE — H&P (Signed)
Reviewed paper H+P, will be scanned into chart. No changes noted.  

## 2017-08-31 NOTE — Discharge Instructions (Addendum)
Take it easy today and resume normal household activities tomorrow. Take Band-Aids off on Saturday then okay to shower. Resume Coumadin tonight and Plavix tomorrow    AMBULATORY SURGERY  DISCHARGE INSTRUCTIONS   1) The drugs that you were given will stay in your system until tomorrow so for the next 24 hours you should not:  A) Drive an automobile B) Make any legal decisions C) Drink any alcoholic beverage   2) You may resume regular meals tomorrow.  Today it is better to start with liquids and gradually work up to solid foods.  You may eat anything you prefer, but it is better to start with liquids, then soup and crackers, and gradually work up to solid foods.   3) Please notify your doctor immediately if you have any unusual bleeding, trouble breathing, redness and pain at the surgery site, drainage, fever, or pain not relieved by medication.   4) Additional Instructions:Drink lots of fluids today.  Take tylenol if needed for discomfort.  Move around today but do not overdue it.  Stay safe        Please contact your physician with any problems or Same Day Surgery at [phone], Monday through Friday 6 am to 4 pm, or [place] at [facility] number at [phone].

## 2017-09-01 ENCOUNTER — Telehealth: Payer: Self-pay | Admitting: Cardiovascular Disease

## 2017-09-01 ENCOUNTER — Encounter: Payer: Self-pay | Admitting: Orthopedic Surgery

## 2017-09-01 NOTE — Telephone Encounter (Signed)
Pt stated she had surgery yesterday & has been off her plavix & coumadin. She states she needed to know how to take her Coumadin & asked pt if they gave her any instructions & she said nothing but to start her regular meds back! I advised pt to continue taking normal dose of Coumadin & confirmed her dose of Coumadin which is 2mg  daily except 3mg  on Mondays. Also, did not see any note in the chart to hold Coumadin by Cardiology. Advised pt in the future to notify us if she has to hold Coumadin for any reason.Pt advised to follow up next week for an appt & appt set.

## 2017-09-01 NOTE — Telephone Encounter (Signed)
Pt states she had back surgery yesterday and started back her coumadin last night on 2 mg, but has a question regarding the doseage. Please call.

## 2017-09-04 NOTE — Anesthesia Postprocedure Evaluation (Signed)
Anesthesia Post Note  Patient: [name]  Procedure(s) Performed: [name] (N/A Back)  Patient location during evaluation: PACU Anesthesia Type: General Level of consciousness: awake and alert Pain management: pain level controlled Vital Signs Assessment: post-procedure vital signs reviewed and stable Respiratory status: spontaneous breathing, nonlabored ventilation, respiratory function stable and patient connected to nasal cannula oxygen Cardiovascular status: blood pressure returned to baseline and stable Postop Assessment: no apparent nausea or vomiting Anesthetic complications: no     Last Vitals:  Vitals:   08/31/17 1538 08/31/17 1630  BP: (!) 160/69 (!) 155/58  Pulse: (!) 59 60  Resp: 18 15  Temp: (!) 36.1 C   SpO2: 99% 98%    Last Pain:  Vitals:   09/01/17 0846  TempSrc:   PainSc: [name]

## 2017-09-05 LAB — SURGICAL PATHOLOGY

## 2017-09-06 ENCOUNTER — Ambulatory Visit (INDEPENDENT_AMBULATORY_CARE_PROVIDER_SITE_OTHER): Payer: Medicare Other

## 2017-09-06 DIAGNOSIS — Z5181 Encounter for therapeutic drug level monitoring: Secondary | ICD-10-CM

## 2017-09-06 DIAGNOSIS — I482 Chronic atrial fibrillation, unspecified: Secondary | ICD-10-CM

## 2017-09-06 LAB — POCT INR: INR: 2.2

## 2017-09-08 ENCOUNTER — Telehealth: Payer: Self-pay | Admitting: Internal Medicine

## 2017-09-08 NOTE — Telephone Encounter (Signed)
Pt daughter in law states the pain clinic at [facility] checked her INR this morning and it was 4.1. Please call and let pt know what she should do.

## 2017-09-08 NOTE — Telephone Encounter (Signed)
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 Pt went to [place] clinic (pain clinic) today for her steroid injection and per daughter in law the INR was 4.1. We attempted to call the clinic and they were closed, Dr. Sharlet Salina. Thus, we dosed the elevated INR that was stated. Pt did start prednisone 60mg  x4 days on 09/06/17, then 40mg  x4 days and then 20mg  x4 days with regimen to complete on 09/18/17. Discussed above with Cyril Mourning, Pharmacist, we will skip today's dose, then take 1mg  on Sat and Sun and come into [place] office on Monday for re-evaluation.

## 2017-09-11 ENCOUNTER — Ambulatory Visit (INDEPENDENT_AMBULATORY_CARE_PROVIDER_SITE_OTHER): Payer: Medicare Other

## 2017-09-11 DIAGNOSIS — I482 Chronic atrial fibrillation, unspecified: Secondary | ICD-10-CM

## 2017-09-11 DIAGNOSIS — Z5181 Encounter for therapeutic drug level monitoring: Secondary | ICD-10-CM

## 2017-09-11 LAB — POCT INR: INR: 1.8

## 2017-09-12 ENCOUNTER — Ambulatory Visit (INDEPENDENT_AMBULATORY_CARE_PROVIDER_SITE_OTHER): Payer: Medicare Other | Admitting: *Deleted

## 2017-09-12 ENCOUNTER — Telehealth: Payer: Self-pay | Admitting: Cardiology

## 2017-09-12 DIAGNOSIS — I442 Atrioventricular block, complete: Secondary | ICD-10-CM

## 2017-09-12 NOTE — Progress Notes (Signed)
Remote pacemaker transmission.   

## 2017-09-12 NOTE — Telephone Encounter (Signed)
Spoke with pt and reminded pt of remote transmission that is due today. Pt verbalized understanding.   

## 2017-09-13 LAB — CUP PACEART REMOTE DEVICE CHECK
Battery Remaining Longevity: 99 mo
Implantable Lead Implant Date: 20000718
Implantable Lead Location: 753859
Implantable Pulse Generator Implant Date: 20140217
Lead Channel Impedance Value: 516 Ohm
Lead Channel Impedance Value: 67 Ohm
Lead Channel Setting Pacing Amplitude: 2.5 V
Lead Channel Setting Sensing Sensitivity: 5.6 mV
MDC IDC LEAD IMPLANT DT: 20000718
MDC IDC LEAD LOCATION: 753860
MDC IDC MSMT BATTERY IMPEDANCE: 426 Ohm
MDC IDC MSMT BATTERY VOLTAGE: 2.79 V
MDC IDC SESS DTM: 20181113164116
MDC IDC SET LEADCHNL RV PACING PULSEWIDTH: 0.4 ms
MDC IDC STAT BRADY RV PERCENT PACED: 100 %

## 2017-09-14 ENCOUNTER — Ambulatory Visit (INDEPENDENT_AMBULATORY_CARE_PROVIDER_SITE_OTHER): Payer: Medicare Other

## 2017-09-14 DIAGNOSIS — Z5181 Encounter for therapeutic drug level monitoring: Secondary | ICD-10-CM | POA: Diagnosis not present

## 2017-09-14 DIAGNOSIS — I482 Chronic atrial fibrillation, unspecified: Secondary | ICD-10-CM

## 2017-09-14 LAB — POCT INR: INR: 3.2

## 2017-09-15 ENCOUNTER — Encounter: Payer: Self-pay | Admitting: Cardiology

## 2017-09-15 ENCOUNTER — Ambulatory Visit (INDEPENDENT_AMBULATORY_CARE_PROVIDER_SITE_OTHER): Payer: Medicare Other | Admitting: *Deleted

## 2017-09-15 DIAGNOSIS — I482 Chronic atrial fibrillation, unspecified: Secondary | ICD-10-CM

## 2017-09-15 DIAGNOSIS — Z5181 Encounter for therapeutic drug level monitoring: Secondary | ICD-10-CM

## 2017-09-15 LAB — POCT INR: INR: 1.6

## 2017-09-15 NOTE — Progress Notes (Signed)
S/w Dr Sharlet Salina office and notified them that patient's INR was 1.6 today. They said that was fine and to have patient come to their office at 3 pm today.

## 2017-09-17 ENCOUNTER — Encounter: Payer: Self-pay | Admitting: Emergency Medicine

## 2017-09-17 ENCOUNTER — Emergency Department
Admission: EM | Admit: 2017-09-17 | Discharge: 2017-09-17 | Disposition: A | Discharge status: Home / Self Care | Payer: Medicare Other | Attending: Emergency Medicine | Admitting: Emergency Medicine

## 2017-09-17 ENCOUNTER — Other Ambulatory Visit: Payer: Self-pay

## 2017-09-17 DIAGNOSIS — I10 Essential (primary) hypertension: Secondary | ICD-10-CM | POA: Diagnosis not present

## 2017-09-17 DIAGNOSIS — Z950 Presence of cardiac pacemaker: Secondary | ICD-10-CM | POA: Diagnosis not present

## 2017-09-17 DIAGNOSIS — I252 Old myocardial infarction: Secondary | ICD-10-CM | POA: Insufficient documentation

## 2017-09-17 DIAGNOSIS — Z7901 Long term (current) use of anticoagulants: Secondary | ICD-10-CM | POA: Insufficient documentation

## 2017-09-17 DIAGNOSIS — Z7902 Long term (current) use of antithrombotics/antiplatelets: Secondary | ICD-10-CM | POA: Diagnosis not present

## 2017-09-17 DIAGNOSIS — I2510 Atherosclerotic heart disease of native coronary artery without angina pectoris: Secondary | ICD-10-CM | POA: Insufficient documentation

## 2017-09-17 DIAGNOSIS — Z87891 Personal history of nicotine dependence: Secondary | ICD-10-CM | POA: Insufficient documentation

## 2017-09-17 DIAGNOSIS — M545 Low back pain: Secondary | ICD-10-CM | POA: Insufficient documentation

## 2017-09-17 DIAGNOSIS — Z79899 Other long term (current) drug therapy: Secondary | ICD-10-CM | POA: Diagnosis not present

## 2017-09-17 LAB — BASIC METABOLIC PANEL
ANION GAP: 12 (ref 5–15)
BUN: 16 mg/dL (ref 6–20)
CO2: 24 mmol/L (ref 22–32)
Calcium: 9.2 mg/dL (ref 8.9–10.3)
Chloride: 98 mmol/L — ABNORMAL LOW (ref 101–111)
Creatinine, Ser: 0.56 mg/dL (ref 0.44–1.00)
GFR calc Af Amer: >60 mL/min (ref >60)
Glucose, Bld: 159 mg/dL — ABNORMAL HIGH (ref 65–99)
POTASSIUM: 4.4 mmol/L (ref 3.5–5.1)
SODIUM: 134 mmol/L — AB (ref 135–145)

## 2017-09-17 LAB — CBC WITH DIFFERENTIAL/PLATELET
BASOS PCT: 0 %
Basophils Absolute: 0 10*3/uL (ref 0–0.1)
Eosinophils Absolute: 0.1 10*3/uL (ref 0–0.7)
Eosinophils Relative: 1 %
HEMATOCRIT: 55 % — AB (ref 35.0–47.0)
HEMOGLOBIN: 19.1 g/dL — AB (ref 12.0–16.0)
LYMPHS ABS: 1.8 10*3/uL (ref 1.0–3.6)
LYMPHS PCT: 18 %
MCH: 32.4 pg (ref 26.0–34.0)
MCHC: 34.8 g/dL (ref 32.0–36.0)
MCV: 93 fL (ref 80.0–100.0)
MONO ABS: 0.9 10*3/uL (ref 0.2–0.9)
MONOS PCT: 9 %
NEUTROS ABS: 7.1 10*3/uL — AB (ref 1.4–6.5)
NEUTROS PCT: 72 %
PLATELETS: 168 10*3/uL (ref 150–440)
RBC: 5.91 MIL/uL — ABNORMAL HIGH (ref 3.80–5.20)
RDW: 14.9 % — AB (ref 11.5–14.5)
WBC: 9.9 10*3/uL (ref 3.6–11.0)

## 2017-09-17 LAB — URINALYSIS, COMPLETE (UACMP) WITH MICROSCOPIC
Bacteria, UA: NONE SEEN
Bilirubin Urine: NEGATIVE
Glucose, UA: NEGATIVE mg/dL
HGB URINE DIPSTICK: NEGATIVE
Ketones, ur: NEGATIVE mg/dL
Leukocytes, UA: NEGATIVE
Nitrite: NEGATIVE
PROTEIN: NEGATIVE mg/dL
Specific Gravity, Urine: 1.004 — ABNORMAL LOW (ref 1.005–1.030)
pH: 8 (ref 5.0–8.0)

## 2017-09-17 MED ORDER — LIDOCAINE 5 % EX PTCH: 1.0000 | MEDICATED_PATCH | Freq: Two times a day (BID) | CUTANEOUS | 0 refills | Status: AC

## 2017-09-17 MED ORDER — LIDOCAINE 5 % EX PTCH
1.0000 | MEDICATED_PATCH | CUTANEOUS | Status: DC
  Administered 2017-09-17: 1 via TRANSDERMAL
  Filled 2017-09-17: qty 1

## 2017-09-17 NOTE — ED Provider Notes (Signed)
[facility] Emergency Department Provider Note   ____________________________________________   I have reviewed the triage vital signs and the nursing notes.   HISTORY  Chief Complaint Back Pain   History limited by: Not Limited   HPI [name] is a 83 y.o. female who presents to the emergency department today because of back pain.   LOCATION:lower back DURATION:roughly 2 weeks TIMING: started after kyphoplasty SEVERITY: severe CONTEXT: patient states she has had pain since her surgery. Has followed up with her surgeon and has had injection of pain medication. MODIFYING FACTORS: worse with movement. ASSOCIATED SYMPTOMS: denies any   Per medical record review patient has a history of recent kyphoplasty.  Past Medical History:  Diagnosis Date  . C. difficile colitis    feces transplant  . Coronary artery disease    a. NSTEMI 12/2016 => med Rx; b. Nuc 12/2016: low risk; c. NSTEMI 04/2017  . Diverticulitis   . GERD (gastroesophageal reflux disease)   . Hereditary hemochromatosis ([place])    Phlebotomized in the past, not getting phlebotomy now  . Hyperlipidemia   . Hypertension   . Non Hodgkin's lymphoma ([place])    stable; followed by an oncologist  . Osteoarthritis   . 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 Pacemaker-dual-chamber-Medtronic 04/04/2011  . Permanent atrial fibrillation ([place]) 07/20/2009   a. s/p AV nodal ablation; b. s/p MDT PPM; c. CHADS2VASc => 5 (HTN, age x 2, vascular disease, female)    Patient Active Problem List   Diagnosis Date Noted  . NSTEMI (non-ST elevated myocardial infarction) ([place]) 05/07/2017  . Encounter for anticoagulation discussion and counseling   . Pancreatic mass   . Coronary artery disease involving native coronary artery of native heart with unstable angina pectoris ([place])   . Non-ST elevation (NSTEMI) myocardial infarction ([place]) 12/27/2016  . Acute URI 09/29/2016  . Hypotension 09/01/2016  . Bradycardia  09/01/2016  . Hyponatremia 09/01/2016  . Hypokalemia 09/01/2016  . Leukocytosis 09/01/2016  . Meniere disease 09/01/2016  . Dehydration 09/01/2016  . Coagulopathy ([place]) 09/01/2016  . Pancreatic cyst 09/01/2016  . Clostridium difficile colitis 08/28/2016  . Urge incontinence of urine 07/19/2016  . Urinary incontinence without sensory awareness 07/19/2016  . Vaginal atrophy 07/19/2016  . Rectocele 07/19/2016  . Status post hysterectomy 07/19/2016  . Arthritis 04/13/2016  . Gastroesophageal reflux disease with hiatal hernia 04/13/2016  . Mixed hyperlipidemia 04/13/2016  . Neuropathy, peripheral 04/13/2016  . Hereditary hemochromatosis ([place]) 11/26/2015  . Hyperglycemia, unspecified 11/26/2015  . Vitamin D deficiency 11/26/2015  . Osteoarthritis 09/08/2014  . Abnormal blood sugar 05/13/2014  . Contracture of palmar fascia (Dupuytren's) 04/15/2014  . Benign cystic mucinous tumor 04/15/2014  . Triggering of digit 04/15/2014  . Dupuytren's contracture of left hand 04/15/2014  . CLL (chronic lymphocytic leukemia) ([place]) 04/01/2014  . Calculous cholecystitis 02/05/2014  . Cervical pain 02/05/2014  . Chronic obstructive airway disease with asthma ([place]) 02/05/2014  . LBP (low back pain) 02/05/2014  . 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 Post cardiac operation functional disturbance 01/24/2014  . Encounter for therapeutic drug monitoring 11/27/2013  . Encounter for therapeutic drug level monitoring 11/27/2013  . Abdominal mass 06/20/2013  . Central retinal edema, cystoid 12/24/2012  . Atrioventricular block, complete s/p AV ablation 12/11/2012  . Complete atrioventricular block ([place]) 12/11/2012  . H/O cataract extraction 10/08/2012  . Retinal telangiectasis 08/20/2012  . Pseudoaphakia 08/20/2012  . Pseudophakia of both eyes 08/20/2012  . PVC (premature ventricular contraction) 08/14/2012  . Hypertension 05/09/2012  . Essential  hypertension 05/09/2012  . Sleep-disordered breathing  04/04/2011  . Pacemaker-dual-chamber-Medtronic 04/04/2011  . Artificial cardiac pacemaker 04/04/2011  . Long term (current) use of anticoagulants 02/09/2011  . Long term current use of anticoagulant 02/09/2011  . HYPERLIPIDEMIA-MIXED 12/09/2009  . Chronic atrial fibrillation ([place]) 07/20/2009    Past Surgical History:  Procedure Laterality Date  . ABDOMINAL HYSTERECTOMY    . ATRIAL ABLATION SURGERY  2008   AV nodal ablation  . BREAST BIOPSY  benign  . CARDIAC CATHETERIZATION    . CARDIOVERSION    . CHOLECYSTECTOMY    . COLONOSCOPY  oct.12, 2012  . Coronary Stent Intervention N/A 05/09/2017   Performed by Wellington Hampshire, MD at [place] [facility]  . INSERT / REPLACE / REMOVE PACEMAKER    . KYPHOPLASTY-L1 N/A 08/31/2017   Performed by Hessie Knows, MD at [facility]  . Left Heart Cath and Coronary Angiography N/A 05/09/2017   Performed by Wellington Hampshire, MD at [place] [facility]  . PACEMAKER GENERATOR CHANGE N/A 12/17/2012   Performed by Deboraha Sprang, MD at [facility] CATH LAB  . stool transplant    . US ECHOCARDIOGRAPHY  07/2008   EF 55%, mild LVH. No significant valvular abnormalities    Prior to Admission medications   Medication Sig Start Date End Date Taking? Authorizing Provider  ALPRAZolam (XANAX) 0.25 MG tablet Take 0.125 mg by mouth at bedtime.     [provider]  atorvastatin (LIPITOR) 40 MG tablet Take 1 tablet (40 mg total) by mouth daily at 6 PM. 05/10/17   Fritzi Mandes, MD  carvedilol (COREG) 3.125 MG tablet Take 1 tablet (3.125 mg total) by mouth daily. 04/07/17   Minna Merritts, MD  cetirizine (ZYRTEC ALLERGY) 10 MG tablet Take 10 mg by mouth daily as needed.     [provider]  clopidogrel (PLAVIX) 75 MG tablet Take 1 tablet (75 mg total) by mouth daily with breakfast. 05/10/17   Fritzi Mandes, MD  fluticasone [facility]) 50 MCG/ACT nasal spray Place 2 sprays into the nose daily as needed for allergies.     [provider]  folic acid  (FOLVITE) 1 MG tablet Take 1 mg by mouth daily.      [provider]  gabapentin (NEURONTIN) 400 MG capsule Take 400 mg by mouth at bedtime.     [provider]  lidocaine (LIDODERM) 5 % Place 1 patch every 12 (twelve) hours onto the skin. Remove & Discard patch within 12 hours or as directed by MD 09/17/17 09/17/18  Nance Pear, MD  nitroGLYCERIN (NITROSTAT) 0.4 MG SL tablet Place 1 tablet (0.4 mg total) under the tongue every 5 (five) minutes as needed for chest pain. Patient not taking: Reported on 08/31/2017 01/01/17 05/07/17  Minna Merritts, MD  polyvinyl alcohol (LIQUIFILM TEARS) 1.4 % ophthalmic solution Place 2 drops into both eyes 3 (three) times daily as needed (for dry eyes).    [provider]  vancomycin (VANCOCIN) 50 mg/mL oral solution Take 5 mLs (250 mg total) by mouth every 6 (six) hours. Patient taking differently: Take 250 mg by mouth every 6 (six) hours. Qid every 3 days 09/01/16   Theodoro Grist, MD  warfarin (COUMADIN) 3 MG tablet Take 1 tablet (3 mg total) by mouth daily at 6 PM. 05/10/17   Fritzi Mandes, MD    Allergies Amoxicillin; Cefdinir; Cilostazol; Levofloxacin; and Sulfur  Family History  Problem Relation Age of Onset  . Liver disease Father   .  Cancer Mother   . Prostate cancer Neg Hx   . Kidney disease Neg Hx   . Kidney cancer Neg Hx   . Breast cancer Neg Hx   . Ovarian cancer Neg Hx   . Colon cancer Neg Hx     Social History Social History   Tobacco Use  . Smoking status: Former Smoker    Last attempt to quit: 1996    Years since quitting: 22.8  . Smokeless tobacco: Never Used  Substance Use Topics  . Alcohol use: No    Alcohol/week: 0.0 oz  . Drug use: No    Review of Systems Constitutional: No fever/chills Eyes: No visual changes. ENT: No sore throat. Cardiovascular: Denies chest pain. Respiratory: Denies shortness of breath. Gastrointestinal: No abdominal pain.  No nausea, no vomiting.  No diarrhea.    Genitourinary: Negative for dysuria. Positive for increased frequency Musculoskeletal: Positive for back pain. Skin: Negative for rash. Neurological: Negative for headaches, focal weakness or numbness.  ____________________________________________   PHYSICAL EXAM:  VITAL SIGNS: ED Triage Vitals [09/17/17 1904]  Enc Vitals Group     BP (!) 160/72     Pulse Rate 62     Resp 16     Temp (!) 97 F (36.1 C)     Temp Source Oral     SpO2 97 %     Weight 117 lb (53.1 kg)     Height 5\' 2"  (1.575 m)   Constitutional: Alert and oriented.  Eyes: Conjunctivae are normal.  ENT   Head: Normocephalic and atraumatic.   Nose: No congestion/rhinnorhea.   Mouth/Throat: Mucous membranes are moist.   Neck: No stridor. Hematological/Lymphatic/Immunilogical: No cervical lymphadenopathy. Cardiovascular: Normal rate, regular rhythm.  No murmurs, rubs, or gallops.  Respiratory: Normal respiratory effort without tachypnea nor retractions. Breath sounds are clear and equal bilaterally. No wheezes/rales/rhonchi. Gastrointestinal: Soft and non tender. No rebound. No guarding.  Genitourinary: Deferred Musculoskeletal: Normal range of motion in all extremities. No lower extremity edema. Neurologic:  Normal speech and language. No gross focal neurologic deficits are appreciated.  Skin:  Skin is warm, dry and intact. No erythema noted to back.  Psychiatric: Mood and affect are normal. Speech and behavior are normal. Patient exhibits appropriate insight and judgment.  ____________________________________________    LABS (pertinent positives/negatives)  UA not consistent with infection BMP na 134, cr 0.56, glu 159 CBC wbc 9.9  ____________________________________________   EKG  None  ____________________________________________     RADIOLOGY  None  ____________________________________________   PROCEDURES  Procedures  ____________________________________________   INITIAL IMPRESSION / ASSESSMENT AND PLAN / ED COURSE  Pertinent labs & imaging results that were available during my care of the patient were reviewed by me and considered in my medical decision making (see chart for details).  Patient presents because of low back pain since surgery. Concern for infection, chronic pain, amongst other etiologies. Also complaining of urinary frequency concerning for UTI. Work up here without leukocytosis and UA not consistent with infection. Patient did feel some improvement with lidoderm. Discussed with patient importance of follow up.   ____________________________________________   FINAL CLINICAL IMPRESSION(S) / ED DIAGNOSES  Final diagnoses:  Low back pain, unspecified back pain laterality, unspecified chronicity, with sciatica presence unspecified     Note: This dictation was prepared with Dragon dictation. Any transcriptional errors that result from this process are unintentional     Nance Pear, MD 09/17/17 2041

## 2017-09-17 NOTE — ED Notes (Signed)
Pt ambulated to bathroom with minimal assist - yelled out a lot but was able to roll to her side, push herself up and then get to the toilet. This nurse pointed out to pt and family that she was strong and able to do it all on her own and just wanted someone close at hand. Daughter in law states they will be contacting primary care tomorrow regarding rehab, and this nurse also advised that she could inquire about home health as well.

## 2017-09-17 NOTE — ED Triage Notes (Signed)
Pt had kyphoplasty on 11/1 for fx vertebrae - has been in pain since. Had a "pain shot" on Friday and states didn't work. Dr Kyla Balzarine increased her does of vicodin but pt states not working.

## 2017-09-17 NOTE — Discharge Instructions (Signed)
Please seek medical attention for any high fevers, chest pain, shortness of breath, change in behavior, persistent vomiting, bloody stool or any other new or concerning symptoms.  

## 2017-09-19 ENCOUNTER — Encounter: Payer: Medicare Other | Admitting: Internal Medicine

## 2017-09-20 ENCOUNTER — Telehealth: Payer: Self-pay | Admitting: Cardiovascular Disease

## 2017-09-20 ENCOUNTER — Encounter: Payer: Self-pay | Admitting: Internal Medicine

## 2017-09-20 NOTE — Telephone Encounter (Signed)
Spoke with Berdine Addison RN over at [facility] and she states that patient is having a CT myelogram. She states that they need clearance for patient to hold Plavix for 5 days before and coumadin 4 days before and would also need INR check day before or morning of her test. Will route to Dr. Rockey Situ for clearance and then back to nurse at [facility]. Patient had heart cath in July 2018 with stent placement at that time and has history of afib.

## 2017-09-20 NOTE — Telephone Encounter (Signed)
Left voicemail message to call back  

## 2017-09-20 NOTE — Telephone Encounter (Signed)
[name] from [facility] calling     [facility] Pre-operative Risk Assessment    Request for surgical clearance:  1. What type of surgery is being performed?  CT   2. When is this surgery scheduled? Not scheduled yet  3. Are there any medications that need to be held prior to surgery and how long? PLavix 5 days prior and Comadin 4 day prior   4. Practice name and name of physician performing surgery? [facility] will be doing   5. What is your office phone and fax number? [phone]   4. Anesthesia type (None, local, MAC, general) ?    Rudi Coco 09/20/2017, 12:31 PM  _________________________________________________________________   (provider comments below)

## 2017-09-24 NOTE — Telephone Encounter (Signed)
Would NOT stop plavix,  Recent stent placement 3 months ago If this is an emergency, we will need to talk with her doctors

## 2017-09-25 NOTE — Telephone Encounter (Signed)
Sent message over to Eleele at [facility]. Will route via fax as well.

## 2017-09-27 ENCOUNTER — Telehealth: Payer: Self-pay | Admitting: Cardiovascular Disease

## 2017-09-27 NOTE — Telephone Encounter (Signed)
Just admitted to [facility] for [facility]  DIL says she is due for inr Monday   Can this be ordered to be done at home so patient doesn't have to come to the office   Please call to discuss or give a verbal order   Leaving a detailed msg is ok also for a verbal order

## 2017-09-27 NOTE — Telephone Encounter (Signed)
Spoke with Ophelia Shoulder RN at [facility] and she did receive information that patient should not stop plavix at this time due to recent stent placement. She was appreciative for the follow up and had no further questions at this time.

## 2017-09-27 NOTE — Telephone Encounter (Signed)
Spoke w/ Amy.  She will check pt's INR and call w/ results on Monday. Provided her w/ my direct #.

## 2017-10-02 ENCOUNTER — Other Ambulatory Visit: Payer: Medicare Other

## 2017-10-02 ENCOUNTER — Ambulatory Visit (INDEPENDENT_AMBULATORY_CARE_PROVIDER_SITE_OTHER): Payer: Medicare Other

## 2017-10-02 ENCOUNTER — Ambulatory Visit: Payer: Medicare Other | Admitting: Oncology

## 2017-10-02 DIAGNOSIS — I482 Chronic atrial fibrillation, unspecified: Secondary | ICD-10-CM

## 2017-10-02 DIAGNOSIS — Z5181 Encounter for therapeutic drug level monitoring: Secondary | ICD-10-CM

## 2017-10-02 LAB — POCT INR: INR: 1.3

## 2017-10-02 NOTE — Telephone Encounter (Signed)
Spoke with patients daughter per release form and advised about the conversation with Estill Bamberg PA at [facility] this morning. Advised that CT Myelogram will not help with pain. Discussed at length and she reports that she is not taking her coumadin but she is taking her plavix. She wants to know if the patient persists to have this done if they could move forward. Reviewed risks of holding coumadin and plavix. Advised that I would route this to Dr. Rockey Situ for review and instructions for holding plavix if she continues with wanting to have this done. She verbalized understanding of our conversation and had no further questions at this time.

## 2017-10-02 NOTE — Telephone Encounter (Signed)
[name] returning call

## 2017-10-02 NOTE — Telephone Encounter (Signed)
Spoke with Marin Olp PA over at [facility]. She states that the patient has a L1 compression fracture which she is having severe pain. They recommended that she wear a brace to help with her pain and she refuses to wear it due to her anxiety. They ordered CT Myelogram which can't be done at this time. She reports that patients daughter Keanu Frickey called and said that Dr. Rockey Situ would be writing a letter for her to have it done. Estill Bamberg also reports that she has some ulcers to her bottom which are open as well. She states that she is not a surgical candidate and this test will not help her pain. Estill Bamberg just wanted to reach out to make [place] aware of her situation. Let her know that I would reach out to daughter and also review this with Dr. Rockey Situ. She was appreciative for the call back.

## 2017-10-02 NOTE — Patient Instructions (Signed)
Spoke w/ [facility] nurse. Pt's INR via venipuncture was elevated on Friday, so she held coumadin on Fri, Sat & Sun. Advised her to have pt resume previous dosage of 2 mg daily except 3 mg on Mondays. Recheck in 1 week.

## 2017-10-02 NOTE — Telephone Encounter (Signed)
Left voicemail message to call back  

## 2017-10-03 NOTE — Telephone Encounter (Signed)
Small diameter stent. Should do at least 6 months of plavix and if plavix is held, she should take Aspirin 81 mg daily.

## 2017-10-03 NOTE — Telephone Encounter (Signed)
As detailed would recommend at least 6 months on aspirin and Plavix There would be a risk coming off the Plavix at 6 months Would stay on aspirin if she does come off Plavix for procedure after 6 months 6 months would be middle of January 2019

## 2017-10-03 NOTE — Telephone Encounter (Signed)
STENT RESOLUTE ONYX 2.0X12 drug eluting stent Placed in mid RCA May 09, 2017 Patient is requesting to stop Plavix for management of vertebral fracture, severe back pain Coming up on 5 months Think okay to stop Plavix?

## 2017-10-04 NOTE — Telephone Encounter (Signed)
Spoke with [name] at [facility] and reviewed that Dr. Rockey Situ and Dr. Fletcher Anon have entered updated notes regarding her medications. She reports that imaging has been placed on hold at this time. She had no further questions at this time.

## 2017-10-30 ENCOUNTER — Telehealth: Payer: Self-pay

## 2017-10-31 NOTE — Telephone Encounter (Signed)
Pt is overdue for INR check.  Left message for her to call and either make appt or have [facility] nurse call w/ reading.

## 2017-10-31 DEATH — deceased

## 2017-11-01 NOTE — Telephone Encounter (Signed)
Pt's daughter called back stating that pt entered [facility]osp[facility] care ~2 weeks ago and passed away on November 16, 2017. She was grateful for all the care and love our office provided to pt during her time here.

## 2017-11-10 ENCOUNTER — Telehealth: Payer: Self-pay | Admitting: Internal Medicine

## 2017-11-10 NOTE — Telephone Encounter (Signed)
Spoke with pts daughter in law offered our condolences for pt passing informed her that I would send out a pre-paid box to return the home monitor in.

## 2017-11-10 NOTE — Telephone Encounter (Signed)
Patient daughter in law not sure what to do with device monitoring system at home.  Should she return it to the company?  Please call to discuss .

## 2017-11-18 ENCOUNTER — Other Ambulatory Visit: Payer: Self-pay | Admitting: Nurse Practitioner

## 2019-05-05 IMAGING — CT CT L SPINE W/O CM
1 of 8 series · 5 of 14 positions shown, 7 images · non-contrast
Comparison: 12/27/2016

CLINICAL DATA: Severe low back pain over the last week which is
worsening.

EXAM:
CT LUMBAR SPINE WITHOUT CONTRAST
TECHNIQUE: Multidetector CT imaging of the lumbar spine was performed without
intravenous contrast administration. Multiplanar CT image
reconstructions were also generated.

[Series 3: l spine soft · axial · 0.34mm/px · z∈[-706,-542]mm · 5 of 124 slices shown, 7 images]
[im 21/124  soft-tissue]
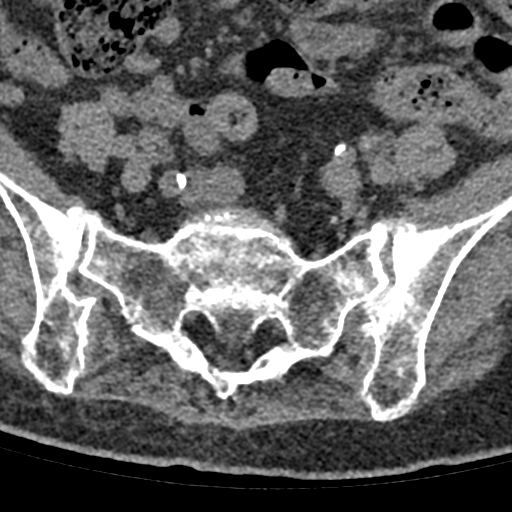
[im 21/124  bone]
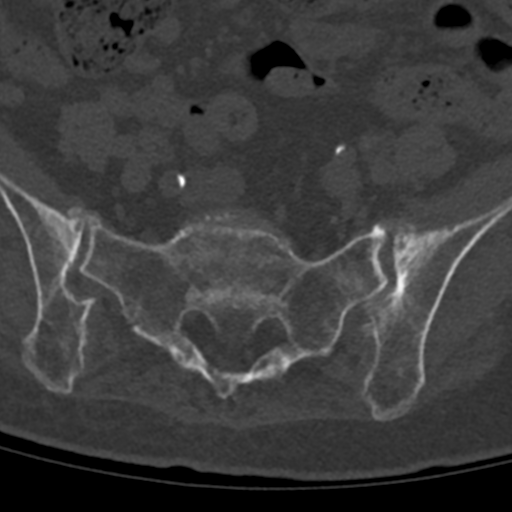
[im 42/124  bone]
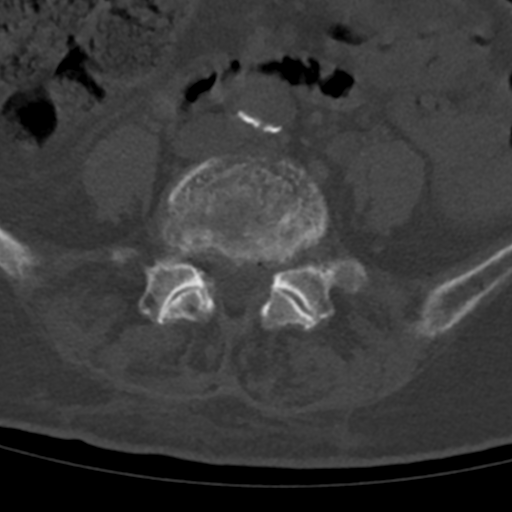
[im 62/124  bone]
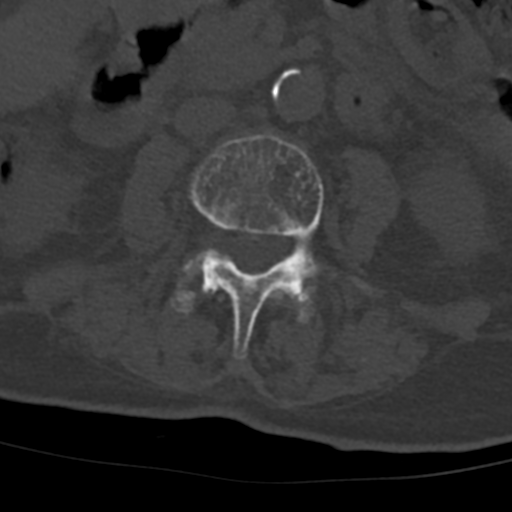
[im 83/124  bone]
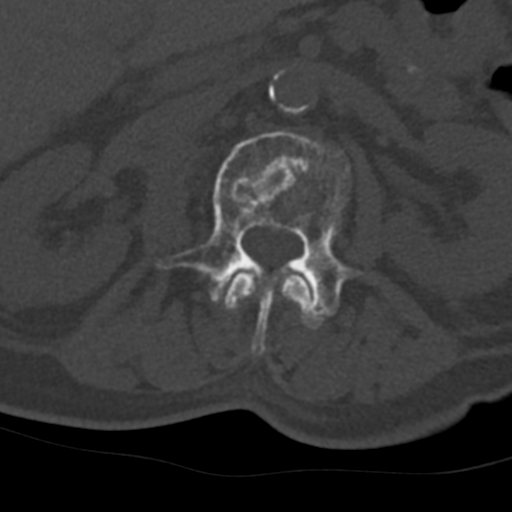
[im 103/124  soft-tissue]
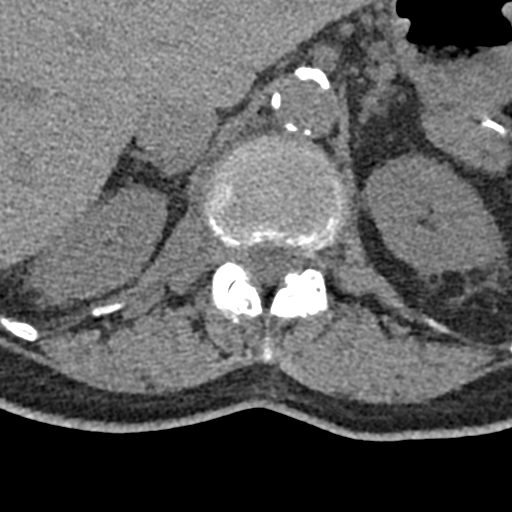
[im 103/124  bone]
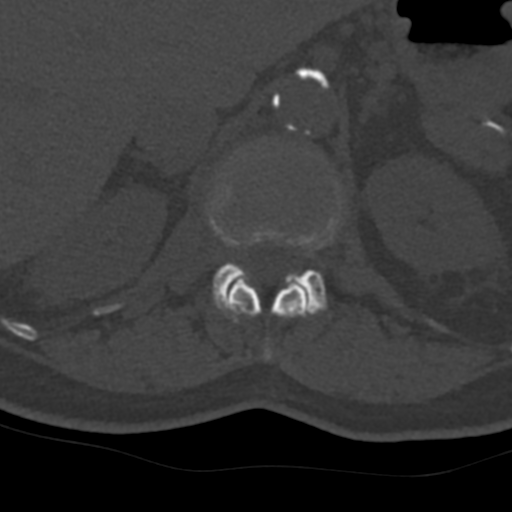

[5 of 14 positions shown; findings below may reference images not displayed]

FINDINGS: Segmentation: 5 lumbar type vertebral bodies. L5 has some
transitional features.

Alignment: Mild curvature convex to the left. Straightening of the
normal lumbar lordosis. 4 mm anterolisthesis L3-4.

Vertebrae: Acute/subacute superior endplate compression deformity at
L1. Loss of height 10%. No retropulsed bone. No sign that this
represents anything other than a benign osteoporotic fracture. Old
central superior endplate depression at L2. Old superior endplate
depression at L3.

Paraspinal and other soft tissues: Negative except for aortic
atherosclerosis.

Disc levels: T12-L1:  Unremarkable.

L1-2: Endplate osteophytes. Mild facet hypertrophy. Moderate
multifactorial stenosis.

L2-3: Circumferential disc protrusion. Facet and ligamentous
hypertrophy. Moderate multifactorial stenosis.

L3-4: Facet arthropathy with 4 mm anterolisthesis. Bulging of the
disc. Severe multifactorial stenosis.

L4-5: Disc degeneration with near-complete loss of disc height.
Endplate osteophytes. Mild facet hypertrophy. Mild stenosis of both
lateral recesses and neural foramina. Foraminal stenosis worse on
the left.

L5-S1: Somewhat transitional. Minimal disc bulge. Mild facet
arthropathy. No stenosis.
IMPRESSION: Acute superior endplate compression fracture at L1 with minimal loss
of height. No retropulsed bone.

Old compression deformities at L2 and L3.

Moderate multifactorial spinal stenosis at L1-2 and L2-3.

Severe multifactorial spinal stenosis at L3-4.

Lateral recess and foraminal stenosis at L4-5 left worse than right.
